# Patient Record
Sex: Female | Born: 1945 | Race: White | Hispanic: No | Marital: Married | Smoking: Never smoker
Health system: Southern US, Community
[De-identification: ages and names within clinical notes are randomized; demographics above are authoritative.]

## PROBLEM LIST (undated history)

## (undated) DIAGNOSIS — E785 Hyperlipidemia, unspecified: Secondary | ICD-10-CM

## (undated) DIAGNOSIS — L565 Disseminated superficial actinic porokeratosis (DSAP): Secondary | ICD-10-CM

## (undated) DIAGNOSIS — C801 Malignant (primary) neoplasm, unspecified: Secondary | ICD-10-CM

## (undated) DIAGNOSIS — Z889 Allergy status to unspecified drugs, medicaments and biological substances status: Secondary | ICD-10-CM

## (undated) DIAGNOSIS — N289 Disorder of kidney and ureter, unspecified: Secondary | ICD-10-CM

## (undated) DIAGNOSIS — C4492 Squamous cell carcinoma of skin, unspecified: Secondary | ICD-10-CM

## (undated) DIAGNOSIS — M109 Gout, unspecified: Secondary | ICD-10-CM

## (undated) DIAGNOSIS — M1990 Unspecified osteoarthritis, unspecified site: Secondary | ICD-10-CM

## (undated) DIAGNOSIS — I10 Essential (primary) hypertension: Secondary | ICD-10-CM

## (undated) DIAGNOSIS — I83893 Varicose veins of bilateral lower extremities with other complications: Secondary | ICD-10-CM

## (undated) HISTORY — DX: Essential (primary) hypertension: I10

## (undated) HISTORY — PX: TONSILLECTOMY: SUR1361

## (undated) HISTORY — DX: Varicose veins of bilateral lower extremities with other complications: I83.893

## (undated) HISTORY — DX: Malignant (primary) neoplasm, unspecified: C80.1

## (undated) HISTORY — DX: Squamous cell carcinoma of skin, unspecified: C44.92

## (undated) HISTORY — DX: Unspecified osteoarthritis, unspecified site: M19.90

## (undated) HISTORY — DX: Disseminated superficial actinic porokeratosis (DSAP): L56.5

---

## 1995-01-04 HISTORY — PX: KNEE SURGERY: SHX244

## 2003-10-04 ENCOUNTER — Encounter: Payer: Self-pay | Admitting: Unknown Physician Specialty

## 2003-11-04 ENCOUNTER — Encounter: Payer: Self-pay | Admitting: Unknown Physician Specialty

## 2004-06-17 ENCOUNTER — Ambulatory Visit: Payer: Self-pay | Admitting: Internal Medicine

## 2005-02-16 ENCOUNTER — Ambulatory Visit: Payer: Self-pay | Admitting: Gerontology

## 2005-07-18 ENCOUNTER — Ambulatory Visit: Payer: Self-pay | Admitting: Internal Medicine

## 2005-11-08 ENCOUNTER — Ambulatory Visit: Payer: Self-pay | Admitting: Internal Medicine

## 2005-11-14 ENCOUNTER — Ambulatory Visit: Payer: Self-pay | Admitting: Gastroenterology

## 2005-12-20 ENCOUNTER — Ambulatory Visit: Payer: Self-pay

## 2006-01-03 HISTORY — PX: BACK SURGERY: SHX140

## 2006-08-03 ENCOUNTER — Ambulatory Visit: Payer: Self-pay | Admitting: Internal Medicine

## 2007-08-06 ENCOUNTER — Ambulatory Visit: Payer: Self-pay | Admitting: Internal Medicine

## 2008-08-06 ENCOUNTER — Ambulatory Visit: Payer: Self-pay | Admitting: Internal Medicine

## 2009-01-03 HISTORY — PX: COLONOSCOPY: SHX174

## 2009-05-12 ENCOUNTER — Ambulatory Visit: Payer: Self-pay | Admitting: Internal Medicine

## 2009-06-30 ENCOUNTER — Ambulatory Visit: Payer: Self-pay | Admitting: Rheumatology

## 2009-08-10 ENCOUNTER — Ambulatory Visit: Payer: Self-pay | Admitting: Internal Medicine

## 2009-08-11 ENCOUNTER — Ambulatory Visit: Payer: Self-pay | Admitting: Gastroenterology

## 2010-01-03 HISTORY — PX: CYST REMOVAL HAND: SHX6279

## 2010-01-03 HISTORY — PX: BREAST SURGERY: SHX581

## 2010-01-03 HISTORY — PX: OTHER SURGICAL HISTORY: SHX169

## 2010-05-06 ENCOUNTER — Ambulatory Visit: Payer: Self-pay | Admitting: Obstetrics and Gynecology

## 2010-05-13 ENCOUNTER — Ambulatory Visit: Payer: Self-pay | Admitting: Obstetrics and Gynecology

## 2010-06-09 ENCOUNTER — Ambulatory Visit: Payer: Self-pay | Admitting: Internal Medicine

## 2010-08-12 ENCOUNTER — Ambulatory Visit: Payer: Self-pay | Admitting: Internal Medicine

## 2010-08-26 ENCOUNTER — Ambulatory Visit: Payer: Self-pay | Admitting: Internal Medicine

## 2010-09-13 ENCOUNTER — Ambulatory Visit: Payer: Self-pay | Admitting: General Surgery

## 2010-09-13 HISTORY — PX: BREAST BIOPSY: SHX20

## 2010-09-15 LAB — PATHOLOGY REPORT

## 2011-01-04 HISTORY — PX: OTHER SURGICAL HISTORY: SHX169

## 2011-03-01 ENCOUNTER — Ambulatory Visit: Payer: Self-pay | Admitting: General Surgery

## 2011-08-15 ENCOUNTER — Ambulatory Visit: Payer: Self-pay | Admitting: General Surgery

## 2011-09-13 ENCOUNTER — Ambulatory Visit: Payer: Self-pay | Admitting: Internal Medicine

## 2012-05-30 ENCOUNTER — Ambulatory Visit: Payer: Self-pay | Admitting: General Surgery

## 2012-06-01 ENCOUNTER — Encounter: Payer: Self-pay | Admitting: *Deleted

## 2012-08-03 DIAGNOSIS — C801 Malignant (primary) neoplasm, unspecified: Secondary | ICD-10-CM

## 2012-08-03 HISTORY — DX: Malignant (primary) neoplasm, unspecified: C80.1

## 2012-09-10 ENCOUNTER — Ambulatory Visit: Payer: Self-pay | Admitting: General Surgery

## 2012-09-13 ENCOUNTER — Encounter: Payer: Self-pay | Admitting: General Surgery

## 2012-09-17 ENCOUNTER — Encounter: Payer: Self-pay | Admitting: General Surgery

## 2012-09-17 ENCOUNTER — Ambulatory Visit (INDEPENDENT_AMBULATORY_CARE_PROVIDER_SITE_OTHER): Payer: No Typology Code available for payment source | Admitting: General Surgery

## 2012-09-17 VITALS — BP 130/68 | HR 74 | Resp 14 | Ht 61.0 in | Wt 156.0 lb

## 2012-09-17 DIAGNOSIS — L97912 Non-pressure chronic ulcer of unspecified part of right lower leg with fat layer exposed: Secondary | ICD-10-CM

## 2012-09-17 DIAGNOSIS — Z1239 Encounter for other screening for malignant neoplasm of breast: Secondary | ICD-10-CM | POA: Insufficient documentation

## 2012-09-17 DIAGNOSIS — L97909 Non-pressure chronic ulcer of unspecified part of unspecified lower leg with unspecified severity: Secondary | ICD-10-CM | POA: Insufficient documentation

## 2012-09-17 DIAGNOSIS — I83893 Varicose veins of bilateral lower extremities with other complications: Secondary | ICD-10-CM | POA: Insufficient documentation

## 2012-09-17 NOTE — Progress Notes (Signed)
Patient ID: Sheryl Kennedy, female   DOB: 11/04/1945, 67 y.o.   MRN: [id number]  Chief Complaint  Patient presents with  . Follow-up    mammogram    HPI Sheryl Kennedy is a 67 y.o. female  Here for follow up screening mammogram done on  09/10/12 at [facility].  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 Hypertension   . 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 complications   . Arthritis   . Cancer 08/2012    skin right anterior lower leg, squamous cell    Past Surgical History  Procedure Laterality Date  . Colonoscopy  2011  . Stab pheblectomy  Left 2013  . Vein closure Bilateral 2012  . Cyst removal hand  2012  . Knee surgery  1997  . Back surgery  2008  . Breast surgery Right 2012    Family History  Problem Relation Age of Onset  . Heart disease Mother     Social History History  Substance Use Topics  . Smoking status: Never Smoker   . Smokeless tobacco: Never Used  . Alcohol Use: Yes    Allergies  Allergen Reactions  . Shellfish Allergy     Joint swelling  . Sulfa Antibiotics Rash    Current Outpatient Prescriptions  Medication Sig Dispense Refill  . allopurinol (ZYLOPRIM) 300 MG tablet Take 300 mg by mouth daily.      Marland Kitchen aspirin 81 MG tablet Take 81 mg by mouth daily.      . furosemide (LASIX) 20 MG tablet Take 20 mg by mouth daily.      Marland Kitchen ibuprofen (ADVIL,MOTRIN) 800 MG tablet Take 800 mg by mouth every 8 (eight) hours as needed for pain.      . potassium chloride (KLOR-CON) 20 MEQ packet Take 20 mEq by mouth 2 (two) times daily.      Marland Kitchen spironolactone-hydrochlorothiazide (ALDACTAZIDE) 50-50 MG per tablet Take 1 tablet by mouth daily.      . traMADol (ULTRAM) 50 MG tablet Take 50 mg by mouth every 6 (six) hours as needed for pain.      . vitamin E 400 UNIT capsule Take 400 Units by mouth  daily.       No current facility-administered medications for this visit.    Review of Systems Review of Systems  Constitutional: Negative.   Respiratory: Negative.   Cardiovascular: Negative.     Blood pressure 130/68, pulse 74, resp. rate 14, height 5\' 1"  (1.549 m), weight 156 lb (70.761 kg).  Physical Exam Physical Exam  Constitutional: She is oriented to person, place, and time. She appears well-developed and well-nourished.  Eyes: Conjunctivae are normal. No scleral icterus.  Neck: Neck supple. No thyromegaly present.  Cardiovascular: Normal rate, regular rhythm and normal heart sounds.   Pulses:      Dorsalis pedis pulses are 2+ on the right side, and 2+ on the left side.       Posterior tibial pulses are 2+ on the right side, and 2+ on the left side.  She has a 1 cm circular open wound on the anterior right leg approximately 7 cm above the ankle. This it the site where she had a SCC removed almost 6-8 weeks ago. No edema in the legs. Some residual varicose veins are seen bilaterally.  Feet are warm with brisk capillary refill.  Pulmonary/Chest: Effort normal and breath sounds normal. Right breast exhibits no inverted nipple, no mass, no nipple discharge, no skin change and no tenderness. Left breast exhibits no inverted nipple, no mass, no nipple discharge, no skin change and no tenderness.  Abdominal: Soft. Normal appearance. There is no tenderness.  Lymphadenopathy:    She has no cervical adenopathy.    She has no axillary adenopathy.  Neurological: She is alert and oriented to person, place, and time. She has normal strength. No sensory deficit.    Data Reviewed Mammogram reviewed  Assessment    Stable exam. Slow healing wd right leg. Need to get final path from her dermatologist. It is possible slow healing is related to her VV.    Plan    Patient to return in one year. Will talk to her dermatologist.       Christene Lye 09/17/2012, 3:14 PM

## 2012-09-17 NOTE — Patient Instructions (Addendum)

## 2012-10-01 ENCOUNTER — Ambulatory Visit: Payer: Self-pay | Admitting: General Surgery

## 2013-03-25 ENCOUNTER — Ambulatory Visit: Payer: Self-pay | Admitting: Cardiology

## 2013-05-02 DIAGNOSIS — I10 Essential (primary) hypertension: Secondary | ICD-10-CM | POA: Insufficient documentation

## 2013-05-16 ENCOUNTER — Ambulatory Visit (INDEPENDENT_AMBULATORY_CARE_PROVIDER_SITE_OTHER): Payer: No Typology Code available for payment source | Admitting: Podiatry

## 2013-05-16 ENCOUNTER — Encounter: Payer: Self-pay | Admitting: Podiatry

## 2013-05-16 ENCOUNTER — Ambulatory Visit (INDEPENDENT_AMBULATORY_CARE_PROVIDER_SITE_OTHER): Payer: No Typology Code available for payment source

## 2013-05-16 ENCOUNTER — Other Ambulatory Visit: Payer: Self-pay | Admitting: *Deleted

## 2013-05-16 VITALS — BP 127/74 | HR 76 | Resp 16 | Ht 61.0 in | Wt 155.0 lb

## 2013-05-16 DIAGNOSIS — M722 Plantar fascial fibromatosis: Secondary | ICD-10-CM

## 2013-05-16 MED ORDER — INDOMETHACIN 50 MG PO CAPS: 50.0000 mg | ORAL_CAPSULE | Freq: Two times a day (BID) | ORAL | Status: DC

## 2013-05-16 NOTE — Progress Notes (Signed)
My left foot in the instep and the ankle has been bothering me again. She continues to take her allopurinol a regular basis and states that she did have a stumble been may have initiated some pain to his left foot. She denies fever chills nausea vomiting muscle aches and pains.  Objective: Vital signs are stable she is alert and oriented x3. Pulses are strongly palpable bilateral. She has pain on palpation to the posterior tibial tendon and across the dorsal aspect of the left foot. Radiographic evaluation does demonstrates severe osteoarthritis of the left foot. This is possibly associated with gout and a history of gouty arthritis. She does have erythema and mild edema to the dorsal and dorsal medial aspect of the left foot. Is possibly indicative of an early gout attack.  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  Plan: Discussed etiology pathology conservative versus surgical therapies. Injected the area today with Kenalog and local anesthetic. Wrote her prescription for indomethacin 50 mg 1 twice a day. We also sent her out for blood work consisting of a CBC and liver profile. I will followup with her in a few weeks. Should her blood work come back abnormal we will notify her immediately.

## 2013-05-17 ENCOUNTER — Telehealth: Payer: Self-pay

## 2013-05-17 LAB — ANA: Anti Nuclear Antibody(ANA): POSITIVE — AB

## 2013-05-17 LAB — SEDIMENTATION RATE: Sed Rate: 3 mm/hr (ref 0–40)

## 2013-05-17 LAB — URIC ACID: Uric Acid: 7.4 mg/dL — ABNORMAL HIGH (ref 2.5–7.1)

## 2013-05-17 LAB — RHEUMATOID FACTOR: Rhuematoid fact SerPl-aCnc: 9.5 IU/mL (ref 0.0–13.9)

## 2013-05-17 LAB — C-REACTIVE PROTEIN: CRP: 2.3 mg/L (ref 0.0–4.9)

## 2013-05-17 NOTE — Telephone Encounter (Signed)
Patient called stating the prescription for Indocin 50 mg is making her nauseated and felt like she was going to pass out. She would like to know if there is something else she can try.

## 2013-05-20 NOTE — Telephone Encounter (Signed)
Have her stop the indocin and start ibuprofen 600 mg three times daily.

## 2013-05-21 ENCOUNTER — Telehealth: Payer: Self-pay | Admitting: *Deleted

## 2013-05-21 NOTE — Telephone Encounter (Signed)
Called and spoke with pt regarding lab work. Told her uric acid high. Dr Milinda Pointer would like to refer pt out to see rheumatologist. Dr Precious Reel is not accepting new pts. Sending out pts to [facility]. i left message with valery and delydia asking them dr they use in [place]. Told pt once i get a drs name i will schedule an appt for her. Pt understood.

## 2013-05-28 ENCOUNTER — Telehealth: Payer: Self-pay | Admitting: *Deleted

## 2013-05-28 NOTE — Telephone Encounter (Signed)
[facility]- [address], [place] [address] [phone] FOR APPT

## 2013-05-28 NOTE — Telephone Encounter (Signed)
Called and left message for pt  to call back. appt with dr. Amil Amen 6.25.15 at 10:00. Arrive @ 9.45.

## 2013-06-13 ENCOUNTER — Ambulatory Visit (INDEPENDENT_AMBULATORY_CARE_PROVIDER_SITE_OTHER): Payer: No Typology Code available for payment source | Admitting: Podiatry

## 2013-06-13 ENCOUNTER — Encounter: Payer: Self-pay | Admitting: Podiatry

## 2013-06-13 DIAGNOSIS — M659 Synovitis and tenosynovitis, unspecified: Secondary | ICD-10-CM

## 2013-06-13 DIAGNOSIS — M7750 Other enthesopathy of unspecified foot: Secondary | ICD-10-CM

## 2013-06-13 MED ORDER — COLCHICINE 0.6 MG PO TABS: 0.6000 mg | ORAL_TABLET | Freq: Every day | ORAL | Status: DC

## 2013-06-14 NOTE — Progress Notes (Signed)
She presents today for followup of her plantar fasciitis and gouty arthritis and tendinitis. She states it is still hurts right in here she points to the posterior tibial tendon insertion site. As well as the anterior tibial tendon insertion site.  Objective: Vital signs are stable she is alert and oriented x3 no pain in the left heel. She has pain on palpation of the insertion site of the anterior tibial tendon.  Assessment: Followup of her gouty capsulitis with insertional tendinitis.  Plan: Injected dexamethasone point of tenderness insertion left foot. Also prescribed colchicine for future gout attacks.

## 2013-06-24 DIAGNOSIS — N183 Chronic kidney disease, stage 3 unspecified: Secondary | ICD-10-CM | POA: Insufficient documentation

## 2013-06-24 DIAGNOSIS — N1832 Chronic kidney disease, stage 3b: Secondary | ICD-10-CM | POA: Insufficient documentation

## 2013-07-16 DIAGNOSIS — E785 Hyperlipidemia, unspecified: Secondary | ICD-10-CM | POA: Insufficient documentation

## 2013-07-16 DIAGNOSIS — M1990 Unspecified osteoarthritis, unspecified site: Secondary | ICD-10-CM | POA: Insufficient documentation

## 2013-07-16 DIAGNOSIS — M109 Gout, unspecified: Secondary | ICD-10-CM | POA: Insufficient documentation

## 2013-08-05 ENCOUNTER — Telehealth: Payer: Self-pay | Admitting: *Deleted

## 2013-08-06 ENCOUNTER — Other Ambulatory Visit: Payer: Self-pay | Admitting: *Deleted

## 2013-08-06 MED ORDER — METHYLPREDNISOLONE (PAK) 4 MG PO TABS: ORAL_TABLET | ORAL | Status: DC

## 2013-08-06 NOTE — Telephone Encounter (Signed)
Ok to prescribe medrol dose pack. Take as directed with no refills.

## 2013-08-06 NOTE — Telephone Encounter (Signed)
Per dr Milinda Pointer send over medrol dose pack. Take as directed. Spoke with pt letting her know was sent to [facility]. Pt understood.

## 2013-08-06 NOTE — Telephone Encounter (Signed)
Opened in error

## 2013-08-15 ENCOUNTER — Ambulatory Visit (INDEPENDENT_AMBULATORY_CARE_PROVIDER_SITE_OTHER): Payer: No Typology Code available for payment source | Admitting: Podiatry

## 2013-08-15 VITALS — BP 137/88 | HR 88 | Resp 16

## 2013-08-15 DIAGNOSIS — M722 Plantar fascial fibromatosis: Secondary | ICD-10-CM

## 2013-08-16 NOTE — Progress Notes (Signed)
She presents today with a chief complaint of pain and swelling to her left ankle and foot. She states that since we called in the prednisone recently is doing much better however she still has pain on the lateral aspect of the foot and she points to the fifth metatarsal base.  Objective: Vital signs are stable she is alert and oriented x3. Multiple varicosities possibly resulting in order of the edema to the left foot and ankle. Pulses are palpable left. She has pain on palpation of the medial calcaneal tubercle left. She also has pain on palpation to the fifth metatarsal base left.  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  Plan: I injected the left heel today in an attempt to alleviate plantar fasciitis and lateral compensatory syndrome.

## 2013-09-05 ENCOUNTER — Ambulatory Visit: Payer: No Typology Code available for payment source | Admitting: Podiatry

## 2013-09-05 VITALS — BP 132/67 | HR 84 | Resp 16

## 2013-09-05 DIAGNOSIS — M659 Synovitis and tenosynovitis, unspecified: Secondary | ICD-10-CM

## 2013-09-05 DIAGNOSIS — M7750 Other enthesopathy of unspecified foot: Secondary | ICD-10-CM

## 2013-09-05 DIAGNOSIS — M722 Plantar fascial fibromatosis: Secondary | ICD-10-CM

## 2013-09-05 DIAGNOSIS — M79609 Pain in unspecified limb: Secondary | ICD-10-CM

## 2013-09-05 MED ORDER — NABUMETONE 750 MG PO TABS: 750.0000 mg | ORAL_TABLET | Freq: Two times a day (BID) | ORAL | Status: DC

## 2013-09-05 NOTE — Progress Notes (Signed)
She presents today for a followup of her plantar fasciitis Achilles tendinitis and pain to the lateral aspect of her left foot. She states it is still painful in discussing to be getting any better. She's also complaining to her right knee is so painful and she just about ready to have surgery on it in 3 weeks.  Objective: Vital signs are stable she is alert and oriented x3 she has severe pain on palpation medial continued tubercle of the left heel. Painful tendo Achilles as it inserts on the posterior aspect of the left heel as well as a fifth metatarsal base bursitis or insertional peroneal tendinitis.  Assessment: Plantar fasciitis peroneal tendinitis Achilles tendinitis left.  Plan: Discussed etiology pathology conservative versus surgical therapies at this point I injected the plantar fascial once again today the bursitis of the fifth met base. I placed her in a Cam Walker. I will followup with her in 2 weeks at which time if she is no better we will ask for an MRI.

## 2013-09-18 ENCOUNTER — Ambulatory Visit: Payer: Self-pay | Admitting: General Practice

## 2013-09-18 LAB — BASIC METABOLIC PANEL
Anion Gap: 9 (ref 7–16)
BUN: 26 mg/dL — ABNORMAL HIGH (ref 7–18)
Calcium, Total: 9.7 mg/dL (ref 8.5–10.1)
Chloride: 106 mmol/L (ref 98–107)
Co2: 29 mmol/L (ref 21–32)
Creatinine: 1.48 mg/dL — ABNORMAL HIGH (ref 0.60–1.30)
EGFR (African American): 42 — ABNORMAL LOW
EGFR (Non-African Amer.): 36 — ABNORMAL LOW
Glucose: 81 mg/dL (ref 65–99)
Osmolality: 291 (ref 275–301)
Potassium: 3.8 mmol/L (ref 3.5–5.1)
Sodium: 144 mmol/L (ref 136–145)

## 2013-09-18 LAB — APTT: Activated PTT: <23 secs — ABNORMAL LOW (ref 23.6–35.9)

## 2013-09-18 LAB — CBC
HCT: 43.1 % (ref 35.0–47.0)
HGB: 13.6 g/dL (ref 12.0–16.0)
MCH: 30.2 pg (ref 26.0–34.0)
MCHC: 31.5 g/dL — ABNORMAL LOW (ref 32.0–36.0)
MCV: 96 fL (ref 80–100)
Platelet: 277 10*3/uL (ref 150–440)
RBC: 4.49 10*6/uL (ref 3.80–5.20)
RDW: 16 % — ABNORMAL HIGH (ref 11.5–14.5)
WBC: 12.6 10*3/uL — ABNORMAL HIGH (ref 3.6–11.0)

## 2013-09-18 LAB — URINALYSIS, COMPLETE
Bilirubin,UR: NEGATIVE
Blood: NEGATIVE
Glucose,UR: NEGATIVE mg/dL (ref 0–75)
Hyaline Cast: 1
Ketone: NEGATIVE
Nitrite: NEGATIVE
Ph: 5 (ref 4.5–8.0)
Protein: NEGATIVE
RBC,UR: <1 /HPF (ref 0–5)
Specific Gravity: 1.006 (ref 1.003–1.030)
Squamous Epithelial: NONE SEEN
WBC UR: 12 /HPF (ref 0–5)

## 2013-09-18 LAB — PROTIME-INR
INR: 1
Prothrombin Time: 13.1 secs (ref 11.5–14.7)

## 2013-09-18 LAB — SEDIMENTATION RATE: Erythrocyte Sed Rate: 6 mm/hr (ref 0–30)

## 2013-09-18 LAB — MRSA PCR SCREENING

## 2013-09-19 ENCOUNTER — Encounter: Payer: No Typology Code available for payment source | Admitting: Podiatry

## 2013-09-20 ENCOUNTER — Ambulatory Visit: Payer: Self-pay | Admitting: Internal Medicine

## 2013-09-20 LAB — URINE CULTURE

## 2013-09-24 ENCOUNTER — Ambulatory Visit: Payer: No Typology Code available for payment source | Admitting: General Surgery

## 2013-09-30 ENCOUNTER — Inpatient Hospital Stay: Payer: Self-pay | Admitting: General Practice

## 2013-09-30 HISTORY — PX: JOINT REPLACEMENT: SHX530

## 2013-10-01 LAB — BASIC METABOLIC PANEL
Anion Gap: 7 (ref 7–16)
BUN: 17 mg/dL (ref 7–18)
Calcium, Total: 8.3 mg/dL — ABNORMAL LOW (ref 8.5–10.1)
Chloride: 104 mmol/L (ref 98–107)
Co2: 26 mmol/L (ref 21–32)
Creatinine: 1.15 mg/dL (ref 0.60–1.30)
EGFR (African American): >60
EGFR (Non-African Amer.): 50 — ABNORMAL LOW
Glucose: 89 mg/dL (ref 65–99)
Osmolality: 275 (ref 275–301)
Potassium: 3.2 mmol/L — ABNORMAL LOW (ref 3.5–5.1)
Sodium: 137 mmol/L (ref 136–145)

## 2013-10-01 LAB — HEMOGLOBIN: HGB: 11 g/dL — ABNORMAL LOW (ref 12.0–16.0)

## 2013-10-01 LAB — PLATELET COUNT: Platelet: 200 10*3/uL (ref 150–440)

## 2013-10-02 LAB — BASIC METABOLIC PANEL
Anion Gap: 9 (ref 7–16)
BUN: 12 mg/dL (ref 7–18)
Calcium, Total: 8.9 mg/dL (ref 8.5–10.1)
Chloride: 96 mmol/L — ABNORMAL LOW (ref 98–107)
Co2: 30 mmol/L (ref 21–32)
Creatinine: 1.01 mg/dL (ref 0.60–1.30)
EGFR (African American): >60
EGFR (Non-African Amer.): 58 — ABNORMAL LOW
Glucose: 107 mg/dL — ABNORMAL HIGH (ref 65–99)
Osmolality: 270 (ref 275–301)
Potassium: 2.7 mmol/L — ABNORMAL LOW (ref 3.5–5.1)
Sodium: 135 mmol/L — ABNORMAL LOW (ref 136–145)

## 2013-10-02 LAB — PLATELET COUNT: Platelet: 212 10*3/uL (ref 150–440)

## 2013-10-02 LAB — HEMOGLOBIN: HGB: 11.6 g/dL — ABNORMAL LOW (ref 12.0–16.0)

## 2013-10-03 LAB — BASIC METABOLIC PANEL
Anion Gap: 5 — ABNORMAL LOW (ref 7–16)
BUN: 12 mg/dL (ref 7–18)
Calcium, Total: 9.2 mg/dL (ref 8.5–10.1)
Chloride: 95 mmol/L — ABNORMAL LOW (ref 98–107)
Co2: 34 mmol/L — ABNORMAL HIGH (ref 21–32)
Creatinine: 1.03 mg/dL (ref 0.60–1.30)
EGFR (African American): >60
EGFR (Non-African Amer.): 57 — ABNORMAL LOW
Glucose: 119 mg/dL — ABNORMAL HIGH (ref 65–99)
Osmolality: 269 (ref 275–301)
Potassium: 3.7 mmol/L (ref 3.5–5.1)
Sodium: 134 mmol/L — ABNORMAL LOW (ref 136–145)

## 2013-10-09 ENCOUNTER — Encounter: Payer: Self-pay | Admitting: *Deleted

## 2013-11-04 ENCOUNTER — Encounter: Payer: Self-pay | Admitting: Podiatry

## 2013-11-21 ENCOUNTER — Ambulatory Visit (INDEPENDENT_AMBULATORY_CARE_PROVIDER_SITE_OTHER): Payer: No Typology Code available for payment source | Admitting: Podiatry

## 2013-11-21 VITALS — BP 130/63 | HR 102 | Resp 16

## 2013-11-21 DIAGNOSIS — M25572 Pain in left ankle and joints of left foot: Secondary | ICD-10-CM

## 2013-11-21 DIAGNOSIS — M7752 Other enthesopathy of left foot: Secondary | ICD-10-CM

## 2013-11-21 DIAGNOSIS — M79672 Pain in left foot: Secondary | ICD-10-CM

## 2013-11-21 DIAGNOSIS — M779 Enthesopathy, unspecified: Secondary | ICD-10-CM

## 2013-11-21 NOTE — Patient Instructions (Signed)
Ice the area over the next couple of days to help prevent a steroid flare.

## 2013-11-22 NOTE — Progress Notes (Signed)
Patient ID: REGINE GERMER, female   DOB: Jun 07, 1945, 68 y.o.   MRN: [id number]  Subjective: 68 year old female returns the office today for continued pain in her left foot. Previous states she has been treated for plantar fasciitis, Achilles tendinitis and bursitis and the fifth metatarsal base. She states that she no longer exchanges pain in those areas. After last appointment she was dispensed a CAM walker although she states that she did not wear it. Today she states that she is new areas of pain for which she points the sinus tarsi and the lateral aspect of the left foot. She denies any recent injury or trauma to the area. She said this has been ongoing for some time. She denies any increased swelling or any skin changes over the area. No other complaints at this time. No acute changes since last appointment.  Objective: AAO 3, NAD DP/PT pulses palpable bilaterally, CRT less than 3 seconds Decreased protective sensation with Simms Weinstein monofilament There is tenderness palpation overlying the lateral aspect left foot over the sinus tarsi. There is mild discomfort with range of motion of the subtalar joint. There is no tenderness or pain with range of motion over the ankle joint. No tenderness over the plantar medial tubercle of the calcaneus, fifth metatarsal base or the Achilles tendon. There is no significant increase in edema or increase in warmth to the foot. MMT 4/5, ROM WNL. Varicose veins present. No open lesions identified.  Assessment: 68 year old female with significant chronic breakdown of the foot with pain along the sinus tarsi.  Plan: -Treatment options were discussed including alternatives, risks, complications. -At this time there is no increase in warmth or edema to the foot compared to the contralateral extremity and there does not appear to be any significant change compared to prior evaluation. -At this time discussed a steroid injection with the patient into the sinus  tarsi to see if this helps alleviate her symptoms as well as for diagnostic purposes. Risks and complications of the injection were discussed the patient for which she understands and verbally consents to the injection. Under standard Betadine preparation (which the patient states she is not allergic to, although she has a shellfish allergy), a total of 2 mL of dexamethasone 4 mg and 2% lidocaine plain and 0.5% Marcaine plain mixture was infiltrated into the area of maximal tenderness over the sinus tarsi. Band-Aid applied. Patient tolerated the injection well any complications. Discussed the patient ice the area over the next couple days to help prevent a steroid flare. Also provided instructions for post-injection care. -Discussed the patient should likely benefit from some custom molded orthotics. A prescription was given for the patient to go to [facility] to have these made. -Follow-up in 2 weeks. In the meantime, call the office with any questions, concerns, changes symptoms. If she has continued symptoms we'll get an MRI.

## 2013-12-05 ENCOUNTER — Ambulatory Visit (INDEPENDENT_AMBULATORY_CARE_PROVIDER_SITE_OTHER): Payer: No Typology Code available for payment source | Admitting: Podiatry

## 2013-12-05 VITALS — BP 147/71 | HR 96 | Resp 16

## 2013-12-05 DIAGNOSIS — M7752 Other enthesopathy of left foot: Secondary | ICD-10-CM

## 2013-12-05 DIAGNOSIS — M79672 Pain in left foot: Secondary | ICD-10-CM

## 2013-12-05 DIAGNOSIS — M25572 Pain in left ankle and joints of left foot: Secondary | ICD-10-CM

## 2013-12-05 MED ORDER — METHYLPREDNISOLONE (PAK) 4 MG PO TABS: ORAL_TABLET | ORAL | Status: DC

## 2013-12-05 NOTE — Patient Instructions (Signed)
Methylprednisolone tablets What is this medicine? METHYLPREDNISOLONE (meth ill pred NISS oh lone) is a corticosteroid. It is commonly used to treat inflammation of the skin, joints, lungs, and other organs. Common conditions treated include asthma, allergies, and arthritis. It is also used for other conditions, such as blood disorders and diseases of the adrenal glands. This medicine may be used for other purposes; ask your health care provider or pharmacist if you have questions. COMMON BRAND NAME(S): Medrol, Medrol Dosepak What should I tell my health care provider before I take this medicine? They need to know if you have any of these conditions: -Cushing's syndrome -diabetes -glaucoma -heart problems or disease -high blood pressure -infection such as herpes, measles, tuberculosis, or chickenpox -kidney disease -liver disease -mental problems -myasthenia gravis -osteoporosis -seizures -stomach ulcer or intestine disease including colitis and diverticulitis -thyroid problem -an unusual or allergic reaction to lactose, methylprednisolone, other medicines, foods, dyes, or preservatives -pregnant or trying to get pregnant -breast-feeding How should I use this medicine? Take this medicine by mouth with a drink of water. Follow the directions on the prescription label. Take it with food or milk to avoid stomach upset. If you are taking this medicine once a day, take it in the morning. Do not take more medicine than you are told to take. Do not suddenly stop taking your medicine because you may develop a severe reaction. Your doctor will tell you how much medicine to take. If your doctor wants you to stop the medicine, the dose may be slowly lowered over time to avoid any side effects. Talk to your pediatrician regarding the use of this medicine in children. Special care may be needed. Overdosage: If you think you have taken too much of this medicine contact a poison control center or  emergency room at once. NOTE: This medicine is only for you. Do not share this medicine with others. What if I miss a dose? If you miss a dose, take it as soon as you can. If it is almost time for your next dose, talk to your doctor or health care professional. You may need to miss a dose or take an extra dose. Do not take double or extra doses without advice. What may interact with this medicine? Do not take this medicine with any of the following medications: -mifepristone This medicine may also interact with the following medications: -tacrolimus -vaccines -warfarin This list may not describe all possible interactions. Give your health care provider a list of all the medicines, herbs, non-prescription drugs, or dietary supplements you use. Also tell them if you smoke, drink alcohol, or use illegal drugs. Some items may interact with your medicine. What should I watch for while using this medicine? Visit your doctor or health care professional for regular checks on your progress. If you are taking this medicine for a long time, carry an identification card with your name and address, the type and dose of your medicine, and your doctor's name and address. The medicine may increase your risk of getting an infection. Stay away from people who are sick. Tell your doctor or health care professional if you are around anyone with measles or chickenpox. If you are going to have surgery, tell your doctor or health care professional that you have taken this medicine within the last twelve months. Ask your doctor or health care professional about your diet. You may need to lower the amount of salt you eat. The medicine can increase your blood sugar. If you are a  diabetic check with your doctor if you need help adjusting the dose of your diabetic medicine. What side effects may I notice from receiving this medicine? Side effects that you should report to your doctor or health care professional as soon as  possible: -allergic reactions like skin rash, itching or hives, swelling of the face, lips, or tongue -eye pain, decreased or blurred vision, or bulging eyes -fever, sore throat, sneezing, cough, or other signs of infection, wounds that will not heal -increased thirst -mental depression, mood swings, mistaken feelings of self importance or of being mistreated -pain in hips, back, ribs, arms, shoulders, or legs -swelling of the ankles, feet, hands -trouble passing urine or change in the amount of urine Side effects that usually do not require medical attention (report to your doctor or health care professional if they continue or are bothersome): -confusion, excitement, restlessness -headache -nausea, vomiting -skin problems, acne, thin and shiny skin -weight gain This list may not describe all possible side effects. Call your doctor for medical advice about side effects. You may report side effects to FDA at 1-800-FDA-1088. Where should I keep my medicine? Keep out of the reach of children. Store at room temperature between 20 and 25 degrees C (68 and 77 degrees F). Throw away any unused medicine after the expiration date. NOTE: This sheet is a summary. It may not cover all possible information. If you have questions about this medicine, talk to your doctor, pharmacist, or health care provider.  2015, [employer]vier/[employer] Standard. (2011-09-20 11:38:34)

## 2013-12-09 NOTE — Progress Notes (Signed)
Patient ID: Sheryl Kennedy, female   DOB: 08/21/1945, 68 y.o.   MRN: [id number]  Subjective: 68 year old female returns the office they for follow-up evaluation of pain to the left foot. She states that since last appointment after the injection she's had decreased in symptoms although she does continue to have some mild discomfort. She states that she feels as if when she gets an injection in one place the pain move somewhere else. No acute changes since last appointment. Denies any systemic complaints as fevers, chills, nausea, vomiting. No other complaints at this time.  Objective: AAO 3, NAD DP/PT pulses palpable bilaterally, CRT less than 3 seconds Decreased protective sensation with Simms Weinstein monofilament  There is mild tenderness over the lateral aspect of the foot over the sinus tarsi. This area has decreased in symptoms compared to prior appointment. There is no overlying edema, erythema, increase in warmth. Mild discomfort with range of motion of the subtalar joint. Subtalar joint range of motion is limited. No pain with ankle joint range of motion. There is no specific areas of pinpoint bony tenderness or pain with vibratory sensation. There is no overlying edema, erythema, increase in warmth to the foot/ankle. No pain with calf compression, swelling, warmth, erythema.  Assessment: 68 year old female with chronic breakdown of the foot and lateral foot pain along the course of the sinus tarsi.  Plan: -Treatment options were discussed including alternatives, risks, complications. Patient wishes to hold off on any surgical intervention this time.  -Follow-up in another steroid injection last time given the timeframe. However the patient is requesting a Medrol Dosepak as she has had this before and helps her symptoms. I discussed with her risks and complications of the medication for which she understands and wishes to proceed with medication. A 4 mg six-day taper Medrol Dosepak was  prescribed. -Patient follow-up with [facility] for C[facility].  -Follow-up in one month or after orthotics are dispensed. In the meantime, call the office in the questions, concerns, change in symptoms.

## 2014-01-09 ENCOUNTER — Ambulatory Visit: Payer: No Typology Code available for payment source | Admitting: Podiatry

## 2014-04-18 ENCOUNTER — Ambulatory Visit
Admit: 2014-04-18 | Disposition: A | Discharge status: Home / Self Care | Payer: Self-pay | Attending: Internal Medicine | Admitting: Internal Medicine

## 2014-04-23 ENCOUNTER — Ambulatory Visit: Payer: No Typology Code available for payment source | Admitting: General Surgery

## 2014-04-26 NOTE — Op Note (Signed)
PATIENT NAME:  Sheryl Kennedy, Sheryl Kennedy MR#:  [id number] DATE OF BIRTH:  [birth year]  DATE OF PROCEDURE:  09/30/2013  PREOPERATIVE DIAGNOSIS: Degenerative arthrosis of the right knee.   POSTOPERATIVE DIAGNOSIS: Degenerative arthrosis of the right knee.   PROCEDURE PERFORMED: Right total knee arthroplasty using computer-assisted navigation.   SURGEON: Dr. Skip Estimable.  ASSISTANT: Vance Peper, PA (required to maintain retraction throughout the procedure).    ANESTHESIA: Spinal.   ESTIMATED BLOOD LOSS: 200 mL.   FLUIDS REPLACED: 2000 mL of crystalloid.   TOURNIQUET TIME: 79 minutes.   DRAINS: Two medium drains to reinfusion system.   SOFT TISSUE RELEASES: Anterior cruciate ligament, posterior cruciate ligament, deep medial collateral ligament and patellofemoral ligament.   IMPLANTS UTILIZED: DePuy PFC Sigma size 2 posterior stabilized femoral component (cemented), size 2.5 MBT tibial component (cemented), a 32-mm 3-peg oval dome patella (cemented), and a 17.5-mm stabilized rotating platform polyethylene insert.   INDICATIONS FOR SURGERY: The patient is a 69 year old female who has been seen for complaints of progressive right knee pain with valgus deformity. X-rays demonstrated severe degenerative changes in tricompartmental fashion with severe valgus deformity. After discussion of the risks and benefits of surgical intervention, the patient expressed understanding of the risks and benefits, and agreed with plans for surgical intervention.   PROCEDURE IN DETAIL: The patient was brought into the operating room and, after adequate spinal anesthesia was achieved, a tourniquet was placed on the patient's upper right thigh. The patient's right knee and leg were cleaned and prepped with alcohol and DuraPrep, draped in the usual sterile fashion. A "timeout" was performed as per usual protocol. The right lower extremity was exsanguinated using an Esmarch, and the tourniquet was inflated to 300 mmHg. An  anterior longitudinal incision was made followed by a standard mid vastus approach. A large effusion was evacuated. The deep fibers of the medial collateral ligament were elevated in a subperiosteal fashion off the medial flare of the tibia so as to maintain a continuous soft tissue sleeve. The patella was subluxed laterally and the patellofemoral ligament was incised. Inspection of the knee demonstrated severe degenerative changes with full-thickness loss of articular cartilage to the lateral compartment with significant degenerative changes also noted to the medial compartment. Anterior and posterior cruciate ligaments were excised. Two 4.0-mm Schanz pins were inserted into the femur and into the tibia for attachment of the ray of trackers used for computer-assisted navigation. Hip center was identified using a circumduction technique. Distal landmarks were mapped using the computer. The distal femur and proximal tibia were mapped using the computer. Distal femoral cutting guide was positioned using computer-assisted navigation so as to achieve a 5-degree distal valgus cut. The cut was performed and verified using the computer. Distal femur was sized and it was felt that a size 2 femoral component was appropriate. A size 2 cutting guide was positioned and the anterior cut was performed and verified using the computer. This was followed by completion of the posterior and chamfer cuts. Femoral cutting guide for a central box was then positioned and the central box cut was performed. Attention was then directed to the proximal tibia. Medial and lateral menisci were excised. The extramedullary tibial cutting guide was positioned using computer-assisted navigation so as to achieve 0-degree varus and valgus alignment and 0-degree posterior slope. Cut was performed and verified using the computer. The proximal tibia was sized and it was felt that a size 2.5 tibial tray was appropriate. Tibial and femoral trials were  inserted followed by insertion  of first a 10 and subsequently a 15 and eventually a 17.5-mm polyethylene trial. This allowed for excellent mediolateral soft tissue balancing both in full extension and in flexion. Finally, the patella was cut and prepared so as to accommodate a 32-mm 3-peg oval dome patella. Patellar trial was placed and the knee was placed through a range of motion with excellent patellar tracking appreciated. The femoral trial was removed. Central post hole for the tibial component was reamed, followed by insertion of a keel punch. The tibial trials were then removed. The cut surfaces of bone were irrigated with copious amounts of normal saline with antibiotic solution using pulsatile lavage then suctioned dry. Polymethyl methacrylate cement was prepared in the usual fashion with a vacuum mixer. Cement was applied to the cut surface of the proximal tibia as well as along the undersurface of a size 2.5 MBT tibial component. The tibial component was positioned and impacted into place. Excess cement was removed using freer elevators. Cement was then applied to the cut surface of the femur as well as along the posterior flanges of size 2 posterior stabilized femoral component. Femoral component was positioned and impacted into place. Excess cement was removed using freer elevators. A 17.5-mm polyethylene trial was inserted and the knee was brought in full extension with steady axial compression applied. Finally, cement was applied to the backside of a 32-mm 3-peg oval dome patella and the patellar component was positioned and patellar clamp applied. Excess cement was removed using freer elevators. After adequate curing of cement, the tourniquet was deflated after a total tourniquet time of 79 minutes. Hemostasis was achieved using electrocautery. The knee was irrigated with copious amounts of normal saline with antibiotic solution using pulsatile lavage and then suctioned dry. The knee was inspected  for any residual cement debris; 20 mL of 1.3% Exparel and 40 mL of normal saline was injected along the posterior capsule, medial and lateral gutters, and along the arthrotomy site. A 17.5-mm stabilized rotating platform polyethylene insert was inserted and the knee was placed through a range of motion with excellent patellar tracking appreciated and excellent mediolateral soft tissue balance appreciated, both in full extension and in flexion. Two medium drains were placed in the wound bed and brought through a separate stab incision to be attached to a reinfusion system. The medial parapatellar portion of the incision was reapproximated using interrupted sutures of #1 Vicryl. Then, 30 mL of 0.25% Marcaine with epinephrine were injected into the subcutaneous tissue along the incision site. The subcutaneous tissue was then approximated in layers using first #0 Vicryl followed by 2-0 Vicryl. Skin was closed with skin staples. A sterile dressing was applied. The patient tolerated the procedure well. She was transported to the recovery room in stable condition.   ____________________________ Laurice Record. Holley Bouche., MD jph:lt D: 09/30/2013 14:29:05 ET T: 09/30/2013 15:03:54 ET JOB#: [id number]  cc: Jeneen Rinks P. Holley Bouche., MD, <Dictator> Laurice Record Holley Bouche MD ELECTRONICALLY SIGNED 09/30/2013 17:26

## 2014-04-26 NOTE — Discharge Summary (Signed)
PATIENT NAME:  Sheryl Kennedy, Sheryl Kennedy MR#:  [id number] DATE OF BIRTH:  Sep 03, 1945  DATE OF ADMISSION:  09/30/2013 DATE OF DISCHARGE:  10/03/2013  ADMITTING DIAGNOSIS: Degenerative arthrosis of the right knee.   DISCHARGE DIAGNOSES: Degenerative arthrosis of the right knee.  OPERATION: On 09/30/2013 the patient had a right total knee arthroplasty using computer-aided navigation.   SURGEON: Skip Estimable, MD.   ASSISTANT: Vance Peper, PA.   ANESTHESIA: Spinal.   ESTIMATED BLOOD LOSS: 200 mL.   IMPLANTS USED: DePuy PFC Sigma size 2 posterior stabilized femoral component that was cemented, size 2.5 MBT tibial component (cemented), 32-mm 3-peg oval dome patella that was cemented, and a 17.5-mm stabilized rotating platform polyethylene insert. The patient was stabilized, brought to the recovery room and then brought down to the orthopedic floor.   HISTORY:  The patient is a 69 year old female who presented for an upcoming right total knee arthroplasty. The patient has had several years of progressive worsening pain that has been refractory to conservative treatment including anti-inflammatories, cortisone injections and Synvisc injections.   PHYSICAL EXAMINATION:  GENERAL: Alert female with discomfort with ambulation and an antalgic gait.  LUNGS: Clear to auscultation.  CARDIOVASCULAR: Regular rate and rhythm.  MUSCULOSKELETAL: In regard to the right knee the patient has moderate swelling with lateral tenderness. The patient has full extension to 119 degrees of flexion. The patient has good muscle control and good strength.   HOSPITAL COURSE: After initial admission the patient was brought to the orthopedic floor. On postoperative day 1, the patient had a hemoglobin of 11.0, which was at 11.6 the following day after receiving Autovac transfusion. The patient worked with physical therapy, initially bed to chair, and progressed up to ambulating 125 feet including stairs and did well with therapy. The  patient is ready to go home with home health physical therapy.   DISCHARGE INSTRUCTIONS:  The patient will follow-up with [facility] in 2 weeks for staple removal. The patient will do weight-bear as tolerated on the affected leg. The patient will elevate her leg with 1-2 pillows and use thigh-high TED hose on both legs, removed at nighttime. The patient will elevate her heels off the bed. The patient will use incentive spirometer and be encouraged to do cough and deep breathing.  Diet is regular. The patient will use Polar Care to decrease swelling. She will try to keep her dressing clean and dry. The patient will call the clinic if there is any bright red bleeding, calf pain, bowel or bladder difficulty, or any fever greater than 101.5.   DISCHARGE MEDICATIONS:  To resume home medication and to add tramadol 1-2 tablets every 4 hours as needed for mild to moderate pain. Oxycodone 5 mg 1-2 tablets every 4 hours for severe pain, Lovenox 40 mg subcutaneous once a day for 14 days and to begin aspirin 81 mg once a day and Tylenol 500 mg 1 tablet every 4 hours as needed for fever.    ____________________________ Lenna Sciara. Reche Dixon, [place] jtm:lt D: 10/03/2013 06:43:37 ET T: 10/03/2013 09:12:50 ET JOB#: [id number]  cc: J. Reche Dixon, [place], <Dictator> J Xochilt Conant [facility] PA ELECTRONICALLY SIGNED 10/04/2013 9:15

## 2014-06-04 ENCOUNTER — Encounter: Payer: Self-pay | Admitting: *Deleted

## 2014-06-12 ENCOUNTER — Ambulatory Visit (INDEPENDENT_AMBULATORY_CARE_PROVIDER_SITE_OTHER): Payer: No Typology Code available for payment source | Admitting: Podiatry

## 2014-06-12 ENCOUNTER — Encounter: Payer: Self-pay | Admitting: Podiatry

## 2014-06-12 VITALS — BP 132/70 | HR 83 | Resp 16 | Ht 61.0 in | Wt 150.0 lb

## 2014-06-12 DIAGNOSIS — M7752 Other enthesopathy of left foot: Secondary | ICD-10-CM

## 2014-06-12 DIAGNOSIS — M79672 Pain in left foot: Secondary | ICD-10-CM | POA: Diagnosis not present

## 2014-06-12 DIAGNOSIS — M25572 Pain in left ankle and joints of left foot: Secondary | ICD-10-CM

## 2014-06-12 NOTE — Progress Notes (Signed)
Patient ID: Sheryl Kennedy, female   DOB: 06/01/1945, 69 y.o.   MRN: [id number]  Subjective: 69 year old female presents the office they for follow-up evaluation of left foot pain. She states that since last appointment she has gotten [place] orthotics which seem to help alleviate her symptoms. She does that she continues to get some intermittent discomfort on the outside aspect of her left foot. At this time she is requesting a steroid injection to the same area that help previously. She denies any acute changes since last appointment other than injuring her back at work. No other complaints at this time.  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 There is mild discomfort with subtalar joint range of motion there is restriction of motion. Ankle joint range of motion is intact without any crepitation or pain with range of motion. There is no other areas of tenderness to bilateral lower extremities. There is mild edema to the foot however there is no associated erythema or increase in warmth. No open lesions or pre-ulcerative lesions. No pain with calf compression, swelling, warmth, erythema.  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 Complications of injection were discussed the patient for which she verbally consented and understood. Under sterile conditions a total of 2 mL of a mixture of dexamethasone phosphate and 2% lidocaine plain was infiltrated into the lateral aspect of the sinus tarsi without complications. A Band-Aid was then applied. Postinjection care was  discussed the patient. Patient tolerated injection well any complications. -Continue orthotics. -Follow-up as needed. Encouraged her to call the office with a portions, concerns, change/reoccurrence of symptoms. If symptoms are not resolved over the next couple weeks to call the office for follow-up appointment.

## 2014-08-19 ENCOUNTER — Other Ambulatory Visit: Payer: Self-pay | Admitting: Internal Medicine

## 2014-08-19 DIAGNOSIS — Z1231 Encounter for screening mammogram for malignant neoplasm of breast: Secondary | ICD-10-CM

## 2014-09-21 DIAGNOSIS — Z96651 Presence of right artificial knee joint: Secondary | ICD-10-CM | POA: Insufficient documentation

## 2014-09-22 ENCOUNTER — Ambulatory Visit
Admission: RE | Admit: 2014-09-22 | Discharge: 2014-09-22 | Disposition: A | Discharge status: Home / Self Care | Payer: No Typology Code available for payment source | Source: Ambulatory Visit | Attending: Internal Medicine | Admitting: Internal Medicine

## 2014-09-22 DIAGNOSIS — Z1231 Encounter for screening mammogram for malignant neoplasm of breast: Secondary | ICD-10-CM | POA: Insufficient documentation

## 2014-12-04 ENCOUNTER — Encounter: Payer: Self-pay | Admitting: Podiatry

## 2014-12-04 ENCOUNTER — Ambulatory Visit (INDEPENDENT_AMBULATORY_CARE_PROVIDER_SITE_OTHER): Payer: No Typology Code available for payment source | Admitting: Podiatry

## 2014-12-04 DIAGNOSIS — M779 Enthesopathy, unspecified: Secondary | ICD-10-CM | POA: Diagnosis not present

## 2014-12-04 DIAGNOSIS — M6588 Other synovitis and tenosynovitis, other site: Secondary | ICD-10-CM

## 2014-12-04 DIAGNOSIS — M7750 Other enthesopathy of unspecified foot: Secondary | ICD-10-CM

## 2014-12-04 DIAGNOSIS — M7752 Other enthesopathy of left foot: Secondary | ICD-10-CM

## 2014-12-04 DIAGNOSIS — M25572 Pain in left ankle and joints of left foot: Secondary | ICD-10-CM

## 2014-12-04 NOTE — Progress Notes (Signed)
Patient ID: Sheryl Kennedy, female   DOB: [birth year], 69 y.o.   MRN: [id number]  Subjective: 69 year old female presents the office they for follow-up evaluation of left foot pain. She states that his last appointment she has been doing well. She does that her foot is starting to hurt her and due to the upcoming events she would like to go another steroid injection to help decrease the pain to her left foot. She does that she's been wearing the custom orthotics however she cannot fit them into her flat shoes and she is inquiring about a possible orthotic to fit into her flatter style shoes. She denies any recent injury or trauma. She has started to develop some pain in the outside part of her foot for which she points just proximal to the fifth metatarsal base on the left foot. She denies any redness or warmth. She does have chronic edema to her left leg for which she states that she had a venous duplex performed which was negative for DVT. She does work compression stockings intermittently. No other complaints at this time in no acute changes. She denies any systemic complaints as fevers, chills, nausea, vomiting.  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 There is mild discomfort with subtalar joint range of motion and there is restriction of motion. Ankle joint range of motion is intact without any crepitation or pain with range of motion. There is mild discomfort along the course of the nail tenderness just proximal to the fifth metatarsal base. There is no specific area pinpoint bony tenderness there is no pain vibratory sensation. The peroneal tendons appear to be intact. There are no other areas of tenderness to bilateral lower extremities. There is mild  chronic edema to the left ankle and leg. There is no pain with calf compression, erythema, warmth. No open lesions or pre-ulcerative lesions.   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  Plan: -Treatment options discussed including all alternatives, risks, and complications -At this time the patient is requesting a steroid injection. Complications of injection were discussed the patient for which she verbally consented and understood. Under sterile conditions a total of 2 mL of a mixture of dexamethasone phosphate and 2% lidocaine plain was infiltrated into the lateral aspect of the sinus tarsi without complications. A Band-Aid was then applied. Postinjection care was discussed the patient. Patient tolerated injection well any complications. -Continue orthotics. She would like to proceed with a custom dress orthotics. She was scanned for orthotics today and they were sent to [facility] labs. -Continue compression socks. She states that as he had a venous duplex apparently try get the report of this. May need a venous reflux study. Also follow-up with primary care physician. -Follow-up in 3 weeks to pick up orthotics. Encouraged her to call the office with a portions, concerns, change/reoccurrence of symptoms. If symptoms are not resolved over the next couple weeks to call the office for follow-up appointment.  Celesta Gentile, DPM

## 2014-12-16 ENCOUNTER — Ambulatory Visit: Payer: No Typology Code available for payment source | Admitting: Podiatry

## 2015-01-01 ENCOUNTER — Ambulatory Visit: Payer: No Typology Code available for payment source | Admitting: *Deleted

## 2015-01-01 DIAGNOSIS — M722 Plantar fascial fibromatosis: Secondary | ICD-10-CM

## 2015-01-01 NOTE — Patient Instructions (Signed)

## 2015-01-01 NOTE — Progress Notes (Signed)
Patient ID: Sheryl Kennedy, female   DOB: [birth year], 69 y.o.   MRN: [id number] Patient presents for orthotic pick up.  Verbal and written break in and wear instructions given.  Patient will follow up in 4 weeks if symptoms worsen or fail to improve.

## 2015-04-13 ENCOUNTER — Encounter: Payer: Self-pay | Admitting: *Deleted

## 2015-04-14 ENCOUNTER — Ambulatory Visit
Admission: RE | Admit: 2015-04-14 | Discharge: 2015-04-14 | Disposition: A | Discharge status: Home / Self Care | Payer: Managed Care, Other (non HMO) | Source: Ambulatory Visit | Attending: Gastroenterology | Admitting: Gastroenterology

## 2015-04-14 ENCOUNTER — Encounter
Admission: RE | Disposition: A | Discharge status: Home / Self Care | Payer: Self-pay | Source: Ambulatory Visit | Attending: Gastroenterology

## 2015-04-14 ENCOUNTER — Ambulatory Visit: Payer: Managed Care, Other (non HMO) | Admitting: Anesthesiology

## 2015-04-14 ENCOUNTER — Encounter: Payer: Self-pay | Admitting: *Deleted

## 2015-04-14 DIAGNOSIS — C44722 Squamous cell carcinoma of skin of right lower limb, including hip: Secondary | ICD-10-CM | POA: Insufficient documentation

## 2015-04-14 DIAGNOSIS — Z7982 Long term (current) use of aspirin: Secondary | ICD-10-CM | POA: Insufficient documentation

## 2015-04-14 DIAGNOSIS — Z1211 Encounter for screening for malignant neoplasm of colon: Secondary | ICD-10-CM | POA: Insufficient documentation

## 2015-04-14 DIAGNOSIS — Z79899 Other long term (current) drug therapy: Secondary | ICD-10-CM | POA: Insufficient documentation

## 2015-04-14 DIAGNOSIS — M1991 Primary osteoarthritis, unspecified site: Secondary | ICD-10-CM | POA: Diagnosis not present

## 2015-04-14 DIAGNOSIS — I10 Essential (primary) hypertension: Secondary | ICD-10-CM | POA: Insufficient documentation

## 2015-04-14 DIAGNOSIS — Z86010 Personal history of colonic polyps: Secondary | ICD-10-CM | POA: Insufficient documentation

## 2015-04-14 DIAGNOSIS — Q438 Other specified congenital malformations of intestine: Secondary | ICD-10-CM | POA: Insufficient documentation

## 2015-04-14 HISTORY — PX: COLONOSCOPY WITH PROPOFOL: SHX5780

## 2015-04-14 SURGERY — COLONOSCOPY WITH PROPOFOL
Anesthesia: General

## 2015-04-14 MED ORDER — SODIUM CHLORIDE 0.9 % IV SOLN
INTRAVENOUS | Status: DC
Start: 2015-04-14 — End: 2015-04-14

## 2015-04-14 MED ORDER — LIDOCAINE HCL (CARDIAC) 20 MG/ML IV SOLN
INTRAVENOUS | Status: DC | PRN
  Administered 2015-04-14 (×2): 40 mg via INTRAVENOUS

## 2015-04-14 MED ORDER — FENTANYL CITRATE (PF) 100 MCG/2ML IJ SOLN
INTRAMUSCULAR | Status: DC | PRN
  Administered 2015-04-14: 50 ug via INTRAVENOUS

## 2015-04-14 MED ORDER — MIDAZOLAM HCL 5 MG/5ML IJ SOLN
INTRAMUSCULAR | Status: DC | PRN
  Administered 2015-04-14: 1 mg via INTRAVENOUS

## 2015-04-14 MED ORDER — SODIUM CHLORIDE 0.9 % IV SOLN
2.0000 g | Freq: Once | INTRAVENOUS | Status: AC
  Administered 2015-04-14: 11:00:00 via INTRAVENOUS
  Filled 2015-04-14: qty 2000

## 2015-04-14 MED ORDER — SODIUM CHLORIDE 0.9 % IV SOLN
INTRAVENOUS | Status: DC
  Administered 2015-04-14 (×2): via INTRAVENOUS

## 2015-04-14 MED ORDER — PROPOFOL 10 MG/ML IV BOLUS
INTRAVENOUS | Status: DC | PRN
  Administered 2015-04-14: 40 mg via INTRAVENOUS

## 2015-04-14 MED ORDER — PROPOFOL 500 MG/50ML IV EMUL
INTRAVENOUS | Status: DC | PRN
Start: 2015-04-14 — End: 2015-04-14
  Administered 2015-04-14: 140 ug/kg/min via INTRAVENOUS

## 2015-04-14 NOTE — Anesthesia Preprocedure Evaluation (Signed)
Anesthesia Evaluation  Patient identified by MRN, date of birth, ID band Patient awake    Reviewed: Allergy & Precautions, H&P , NPO status , Patient's Chart, lab work & pertinent test results, reviewed documented beta blocker date and time   History of Anesthesia Complications Negative for: history of anesthetic complications  Airway Mallampati: II  TM Distance: >3 FB Neck ROM: full    Dental no notable dental hx. (+) Teeth Intact Permanent bridge on the bottom right:   Pulmonary neg pulmonary ROS,    Pulmonary exam normal breath sounds clear to auscultation       Cardiovascular Exercise Tolerance: Good hypertension, (-) angina+ Peripheral Vascular Disease (Varicose veins)  (-) CAD, (-) Past MI, (-) Cardiac Stents and (-) CABG Normal cardiovascular exam(-) dysrhythmias (-) Valvular Problems/Murmurs Rhythm:regular Rate:Normal     Neuro/Psych negative neurological ROS  negative psych ROS   GI/Hepatic negative GI ROS, Neg liver ROS,   Endo/Other  negative endocrine ROS  Renal/GU negative Renal ROS  negative genitourinary   Musculoskeletal   Abdominal   Peds  Hematology negative hematology ROS (+)   Anesthesia Other Findings Past Medical History:   Hypertension                                                 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*              Arthritis                                                    Cancer ([place])                                    08/2012         Comment:skin right anterior lower leg, squamous cell   Reproductive/Obstetrics negative OB ROS                             Anesthesia Physical Anesthesia Plan  ASA: II  Anesthesia Plan: General   Post-op Pain Management:    Induction:   Airway Management Planned:   Additional Equipment:   Intra-op Plan:   Post-operative Plan:   Informed Consent: I have reviewed the patients History and  Physical, chart, labs and discussed the procedure including the risks, benefits and alternatives for the proposed anesthesia with the patient or authorized representative who has indicated his/her understanding and acceptance.   Dental Advisory Given  Plan Discussed with: Anesthesiologist, CRNA and Surgeon  Anesthesia Plan Comments:         Anesthesia Quick Evaluation

## 2015-04-14 NOTE — H&P (Signed)
Outpatient short stay form Pre-procedure 04/14/2015 11:38 AM Lollie Sails MD  Primary Physician: Dr. Fulton Reek  Reason for visit:  Colonoscopy  History of present illness:  Patient is a 70 year old female presenting today for colonoscopy. There is a personal history of adenomatous colon polyps. She tolerated her prep well. She takes 81 mg aspirin (held that for several days. She takes no other aspirin products or blood thinning agents.    Current facility-administered medications:  .  0.9 %  sodium chloride infusion, , Intravenous, Continuous, Lollie Sails, MD, Last Rate: 20 mL/hr at 04/14/15 1057 .  0.9 %  sodium chloride infusion, , Intravenous, Continuous, Lollie Sails, MD  Prescriptions prior to admission  Medication Sig Dispense Refill Last Dose  . aspirin 81 MG tablet Take 81 mg by mouth daily.   Past Week at Unknown time  . allopurinol (ZYLOPRIM) 300 MG tablet Take 300 mg by mouth daily.   Taking  . colchicine 0.6 MG tablet Take 1 tablet (0.6 mg total) by mouth daily. 90 tablet 1 Taking  . Cyanocobalamin (RA VITAMIN B-12 TR) 1000 MCG TBCR Take by mouth.     . cyclobenzaprine (FLEXERIL) 5 MG tablet Take 5 mg by mouth 3 (three) times daily.  1   . ibuprofen (ADVIL,MOTRIN) 800 MG tablet Take 800 mg by mouth every 8 (eight) hours as needed for pain.   Taking  . indomethacin (INDOCIN) 50 MG capsule Take 1 capsule (50 mg total) by mouth 2 (two) times daily with a meal. 60 capsule 1 Taking  . metoprolol succinate (TOPROL-XL) 25 MG 24 hr tablet    Taking  . NON FORMULARY 4 (four) times daily. Omega 3 xl   Taking  . potassium chloride (KLOR-CON) 20 MEQ packet Take 20 mEq by mouth 2 (two) times daily.   Taking  . potassium chloride SA (K-DUR,KLOR-CON) 20 MEQ tablet    Taking  . Pseudoephedrine HCl (SUDAFED 24 HOUR PO) Take by mouth.   Taking  . simvastatin (ZOCOR) 40 MG tablet    Taking  . spironolactone-hydrochlorothiazide (ALDACTAZIDE) 50-50 MG per tablet Take 1  tablet by mouth daily.   Taking  . torsemide (DEMADEX) 20 MG tablet    Taking  . traMADol (ULTRAM) 50 MG tablet Take 50 mg by mouth every 6 (six) hours as needed for pain.   Taking     Allergies  Allergen Reactions  . Naproxen Swelling  . Pseudoephedrine Rash    Rhinitis and sneezing  . Shellfish Allergy Rash    Joint swelling  Joint swelling  . Sulfa Antibiotics Rash     Past Medical History  Diagnosis Date  . Hypertension   . 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 complications   . Arthritis   . Cancer ([place]) 08/2012    skin right anterior lower leg, squamous cell    Review of systems:      Physical Exam    Heart and lungs: Regular rate and rhythm without rub or gallop, lungs are bilaterally clear.    HEENT: Normocephalic atraumatic eyes are anicteric    Other:     Pertinant exam for procedure: Soft nontender nondistended bowel sounds positive normoactive.    Planned proceedures: Colonoscopy and indicated procedures. I have discussed the risks benefits and complications of procedures to include not limited to bleeding, infection, perforation and the risk of sedation and the patient wishes to proceed.    Lollie Sails, MD Gastroenterology 04/14/2015  11:38 AM

## 2015-04-14 NOTE — Anesthesia Postprocedure Evaluation (Signed)
Anesthesia Post Note  Patient: Sheryl Kennedy  Procedure(s) Performed: Procedure(s) (LRB): COLONOSCOPY WITH PROPOFOL (N/A)  Patient location during evaluation: Endoscopy Anesthesia Type: General Level of consciousness: awake and alert Pain management: pain level controlled Vital Signs Assessment: post-procedure vital signs reviewed and stable Respiratory status: spontaneous breathing, nonlabored ventilation, respiratory function stable and patient connected to nasal cannula oxygen Cardiovascular status: blood pressure returned to baseline and stable Postop Assessment: no signs of nausea or vomiting Anesthetic complications: no    Last Vitals:  Filed Vitals:   04/14/15 1240 04/14/15 1250  BP: 112/73 122/83  Pulse: 78 82  Temp:    Resp: 15 26    Last Pain: There were no vitals filed for this visit.               Martha Clan

## 2015-04-14 NOTE — Transfer of Care (Signed)
Immediate Anesthesia Transfer of Care Note  Patient: Sheryl Kennedy  Procedure(s) Performed: Procedure(s): COLONOSCOPY WITH PROPOFOL (N/A)  Patient Location: PACU  Anesthesia Type:General  Level of Consciousness: sedated  Airway & Oxygen Therapy: Patient Spontanous Breathing and Patient connected to nasal cannula oxygen  Post-op Assessment: Report given to RN and Post -op Vital signs reviewed and stable  Post vital signs: Reviewed and stable  Last Vitals:  Filed Vitals:   04/14/15 1044  BP: 123/86  Pulse: 90  Temp: 36.4 C  Resp: 18    Complications: No apparent anesthesia complications

## 2015-04-14 NOTE — Op Note (Signed)
[facility] Gastroenterology Patient Name: [name] Procedure Date: 04/14/2015 11:37 AM MRN: [id number] Account #: [id number] Date of Birth: [birth year] Admit Type: Outpatient Age: 70 Room: [facility] ENDO ROOM 3 Gender: Female Note Status: Finalized Procedure:            Colonoscopy Indications:          Personal history of colonic polyps Providers:            Lollie Sails, MD Referring MD:         Leonie Douglas. Doy Hutching, MD (Referring MD) Medicines:            Monitored Anesthesia Care Complications:        No immediate complications. Some mild barotrauma effect                        noted in the proximal ascending and cecum limited to                        the mucosa. Procedure:            Pre-Anesthesia Assessment:                       - ASA Grade Assessment: II - A patient with mild                        systemic disease.                       After obtaining informed consent, the colonoscope was                        passed under direct vision. Throughout the procedure,                        the patient's blood pressure, pulse, and oxygen                        saturations were monitored continuously. The                        Colonoscope was introduced through the anus and                        advanced to the the cecum, identified by appendiceal                        orifice and ileocecal valve. The quality of the bowel                        preparation was good. Findings:      The colon (entire examined portion) was significantly redundant.      The sigmoid colon, descending colon, splenic flexure, transverse colon       and hepatic flexure were significantly tortuous.      The exam was otherwise normal throughout the examined colon.      The digital rectal exam was normal. Impression:           - Redundant colon.                       - Tortuous colon.                       -  No specimens collected. Recommendation:       - Repeat colonoscopy  in 5 years for surveillance. Procedure Code(s):    --- Professional ---                       [phone], Colonoscopy, flexible; diagnostic, including                        collection of specimen(s) by brushing or washing, when                        performed (separate procedure) Diagnosis Code(s):    --- Professional ---                       Q43.8, Other specified congenital malformations of                        intestine                       Z86.010, Personal history of colonic polyps CPT copyright 2016 American Medical Association. All rights reserved. The codes documented in this report are preliminary and upon coder review may  be revised to meet current compliance requirements. Lollie Sails, MD 04/14/2015 12:17:21 PM This report has been signed electronically. Number of Addenda: 0 Note Initiated On: 04/14/2015 11:37 AM Scope Withdrawal Time: 0 hours 5 minutes 26 seconds  Total Procedure Duration: 0 hours 28 minutes 36 seconds       [facility]

## 2015-04-15 ENCOUNTER — Encounter: Payer: Self-pay | Admitting: Gastroenterology

## 2015-06-18 ENCOUNTER — Encounter: Payer: Self-pay | Admitting: Podiatry

## 2015-06-18 ENCOUNTER — Ambulatory Visit (INDEPENDENT_AMBULATORY_CARE_PROVIDER_SITE_OTHER): Payer: Managed Care, Other (non HMO) | Admitting: Podiatry

## 2015-06-18 ENCOUNTER — Ambulatory Visit (INDEPENDENT_AMBULATORY_CARE_PROVIDER_SITE_OTHER): Payer: Managed Care, Other (non HMO)

## 2015-06-18 DIAGNOSIS — M19072 Primary osteoarthritis, left ankle and foot: Secondary | ICD-10-CM

## 2015-06-18 DIAGNOSIS — R52 Pain, unspecified: Secondary | ICD-10-CM

## 2015-06-18 DIAGNOSIS — M779 Enthesopathy, unspecified: Secondary | ICD-10-CM | POA: Diagnosis not present

## 2015-06-18 DIAGNOSIS — M25572 Pain in left ankle and joints of left foot: Secondary | ICD-10-CM

## 2015-06-18 DIAGNOSIS — M7752 Other enthesopathy of left foot: Secondary | ICD-10-CM

## 2015-06-21 NOTE — Progress Notes (Signed)
Patient ID: Sheryl Kennedy, female   DOB: 04-Jun-1945, 70 y.o.   MRN: [id number]  Subjective: 70 year old female presents the office they for recurrent left ankle/foot pain. She says the pain in the same area that she had previously and injections did well up until the last couple weeks she should have recurrence of pain. She denies any recent injury or trauma. She has been wearing the brace which does help. No redness or warmth to her foot.  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 There is discomfort with subtalar joint range of motion and there is restriction of motion. Ankle joint range of motion is intact without any crepitation or pain with range of motion. There is some mild diffuse discomfort along the midfoot however this is not the area of is causing her the majority of pain. There is no area pinpoint bony tenderness or pain the vibratory sensation. There is mild chronic edema to the left ankle and leg. There is no pain with calf compression, erythema, warmth. No open lesions or pre-ulcerative lesions.   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 Senna feet arthritic changes are present. No evidence of acute fracture. -At this time the patient is requesting a steroid injection.  Under sterile conditions a total of 2 mL of a mixture of dexamethasone phosphate and 2% lidocaine plain was infiltrated into the lateral aspect of the sinus tarsi without complications. A Band-Aid was then applied. Postinjection  care was discussed the patient. Patient tolerated injection well any complications. -Continue orthotics. Brace if needed. -Follow-up as needed  Celesta Gentile, DPM

## 2015-08-28 ENCOUNTER — Other Ambulatory Visit: Payer: Self-pay | Admitting: Internal Medicine

## 2015-08-28 DIAGNOSIS — N183 Chronic kidney disease, stage 3 unspecified: Secondary | ICD-10-CM

## 2015-08-28 DIAGNOSIS — I878 Other specified disorders of veins: Secondary | ICD-10-CM

## 2015-09-14 ENCOUNTER — Ambulatory Visit
Admission: RE | Admit: 2015-09-14 | Discharge: 2015-09-14 | Disposition: A | Discharge status: Home / Self Care | Payer: Managed Care, Other (non HMO) | Source: Ambulatory Visit | Attending: Internal Medicine | Admitting: Internal Medicine

## 2015-09-14 DIAGNOSIS — N183 Chronic kidney disease, stage 3 unspecified: Secondary | ICD-10-CM

## 2015-09-14 DIAGNOSIS — I878 Other specified disorders of veins: Secondary | ICD-10-CM | POA: Insufficient documentation

## 2015-10-14 ENCOUNTER — Other Ambulatory Visit: Payer: Self-pay | Admitting: Internal Medicine

## 2015-10-14 DIAGNOSIS — Z1231 Encounter for screening mammogram for malignant neoplasm of breast: Secondary | ICD-10-CM

## 2015-10-29 ENCOUNTER — Ambulatory Visit
Admission: RE | Admit: 2015-10-29 | Discharge: 2015-10-29 | Disposition: A | Discharge status: Home / Self Care | Payer: Managed Care, Other (non HMO) | Source: Ambulatory Visit | Attending: Internal Medicine | Admitting: Internal Medicine

## 2015-10-29 DIAGNOSIS — Z1231 Encounter for screening mammogram for malignant neoplasm of breast: Secondary | ICD-10-CM | POA: Insufficient documentation

## 2015-11-02 ENCOUNTER — Other Ambulatory Visit: Payer: Self-pay | Admitting: Internal Medicine

## 2015-11-02 DIAGNOSIS — N6310 Unspecified lump in the right breast, unspecified quadrant: Secondary | ICD-10-CM

## 2015-11-23 ENCOUNTER — Other Ambulatory Visit: Payer: Self-pay | Admitting: Physician Assistant

## 2015-11-23 ENCOUNTER — Ambulatory Visit
Admission: RE | Admit: 2015-11-23 | Discharge: 2015-11-23 | Disposition: A | Discharge status: Home / Self Care | Payer: Managed Care, Other (non HMO) | Source: Ambulatory Visit | Attending: Internal Medicine | Admitting: Internal Medicine

## 2015-11-23 DIAGNOSIS — N6310 Unspecified lump in the right breast, unspecified quadrant: Secondary | ICD-10-CM

## 2015-11-23 DIAGNOSIS — M2392 Unspecified internal derangement of left knee: Secondary | ICD-10-CM

## 2015-11-24 ENCOUNTER — Other Ambulatory Visit: Payer: Self-pay | Admitting: Internal Medicine

## 2015-11-24 DIAGNOSIS — N6310 Unspecified lump in the right breast, unspecified quadrant: Secondary | ICD-10-CM

## 2015-12-04 ENCOUNTER — Ambulatory Visit
Admission: RE | Admit: 2015-12-04 | Discharge: 2015-12-04 | Disposition: A | Discharge status: Home / Self Care | Payer: Managed Care, Other (non HMO) | Source: Ambulatory Visit | Attending: Physician Assistant | Admitting: Physician Assistant

## 2015-12-04 DIAGNOSIS — M1712 Unilateral primary osteoarthritis, left knee: Secondary | ICD-10-CM | POA: Diagnosis not present

## 2015-12-04 DIAGNOSIS — M25462 Effusion, left knee: Secondary | ICD-10-CM | POA: Diagnosis not present

## 2015-12-04 DIAGNOSIS — M2392 Unspecified internal derangement of left knee: Secondary | ICD-10-CM | POA: Diagnosis present

## 2015-12-09 ENCOUNTER — Ambulatory Visit
Admission: RE | Admit: 2015-12-09 | Discharge: 2015-12-09 | Disposition: A | Discharge status: Home / Self Care | Payer: Managed Care, Other (non HMO) | Source: Ambulatory Visit | Attending: Internal Medicine | Admitting: Internal Medicine

## 2015-12-09 DIAGNOSIS — N62 Hypertrophy of breast: Secondary | ICD-10-CM | POA: Diagnosis not present

## 2015-12-09 DIAGNOSIS — N6310 Unspecified lump in the right breast, unspecified quadrant: Secondary | ICD-10-CM

## 2015-12-09 HISTORY — PX: BREAST BIOPSY: SHX20

## 2015-12-10 LAB — SURGICAL PATHOLOGY

## 2015-12-15 ENCOUNTER — Encounter: Payer: Self-pay | Admitting: Obstetrics and Gynecology

## 2016-01-15 ENCOUNTER — Encounter
Admission: RE | Admit: 2016-01-15 | Discharge: 2016-01-15 | Disposition: A | Discharge status: Home / Self Care | Payer: Medicare HMO | Source: Ambulatory Visit | Attending: Orthopedic Surgery | Admitting: Orthopedic Surgery

## 2016-01-15 DIAGNOSIS — I10 Essential (primary) hypertension: Secondary | ICD-10-CM | POA: Diagnosis not present

## 2016-01-15 DIAGNOSIS — Z01810 Encounter for preprocedural cardiovascular examination: Secondary | ICD-10-CM | POA: Insufficient documentation

## 2016-01-15 DIAGNOSIS — Z01812 Encounter for preprocedural laboratory examination: Secondary | ICD-10-CM | POA: Insufficient documentation

## 2016-01-15 HISTORY — DX: Hyperlipidemia, unspecified: E78.5

## 2016-01-15 HISTORY — DX: Gout, unspecified: M10.9

## 2016-01-15 HISTORY — DX: Allergy status to unspecified drugs, medicaments and biological substances: Z88.9

## 2016-01-15 LAB — CBC
HCT: 42 % (ref 35.0–47.0)
Hemoglobin: 13.9 g/dL (ref 12.0–16.0)
MCH: 30.9 pg (ref 26.0–34.0)
MCHC: 33.1 g/dL (ref 32.0–36.0)
MCV: 93.4 fL (ref 80.0–100.0)
Platelets: 282 10*3/uL (ref 150–440)
RBC: 4.49 MIL/uL (ref 3.80–5.20)
RDW: 16 % — ABNORMAL HIGH (ref 11.5–14.5)
WBC: 8.9 10*3/uL (ref 3.6–11.0)

## 2016-01-15 LAB — POTASSIUM: Potassium: 3.5 mmol/L (ref 3.5–5.1)

## 2016-01-15 NOTE — Pre-Procedure Instructions (Signed)
Component Value Ref Range  Vent Rate (bpm) 95   PR Interval (msec) 134   QRS Interval (msec) 70   QT Interval (msec) 338   QTc (msec) 424    ECG 12-lead (11/10/2015 2:30 PM)  Specimen Performing Laboratory   [facility] GE MUSE RESULTS    ECG 12-lead (11/10/2015 2:30 PM)  Narrative  Normal sinus rhythm  Possible Left atrial enlargement  Left ventricular hypertrophy    Abnormal ECG  No previous ECGs available  I reviewed and concur with this report. Electronically signed [id number] MD, KEN ([id number]) on 11/13/2015 7:37:35 AM

## 2016-01-15 NOTE — Patient Instructions (Signed)
  Your procedure is scheduled on: January 22, 2016 (Friday) Report to Same Day Surgery 2nd floor medical mall [facility] Entrance-take elevator on left to 2nd floor.  Check in with surgery information desk.) To find out your arrival time please call [phone] between 1PM - 3PM on January 21, 2016 (Thursday)  Remember: Instructions that are not followed completely may result in serious medical risk, up to and including death, or upon the discretion of your surgeon and anesthesiologist your surgery may need to be rescheduled.    _x___ 1. Do not eat food or drink liquids after midnight. No gum chewing or hard candies.     __x__ 2. No Alcohol for 24 hours before or after surgery.   __x__3. No Smoking for 24 prior to surgery.   ____  4. Bring all medications with you on the day of surgery if instructed.    __x__ 5. Notify your doctor if there is any change in your medical condition     (cold, fever, infections).     Do not wear jewelry, make-up, hairpins, clips or nail polish.  Do not wear lotions, powders, or perfumes. You may wear deodorant.  Do not shave 48 hours prior to surgery. Men may shave face and neck.  Do not bring valuables to the hospital.    [facility] is not responsible for any belongings or valuables.               Contacts, dentures or bridgework may not be worn into surgery.  Leave your suitcase in the car. After surgery it may be brought to your room.  For patients admitted to the hospital, discharge time is determined by your treatment team.   Patients discharged the day of surgery will not be allowed to drive home.  You will need someone to drive you home and stay with you the night of your procedure.    Please read over the following fact sheets that you were given:   [facility] Preparing for Surgery and or MRSA Information   _x___ Take these medicines the morning of surgery with A SIP OF WATER:    1. METOPROLOL  2.  3.  4.  5.  6.  ____Fleets enema  or Magnesium Citrate as directed.   _x___ Use CHG Soap or sage wipes as directed on instruction sheet   ____ Use inhalers on the day of surgery and bring to hospital day of surgery  ____ Stop metformin 2 days prior to surgery    ____ Take 1/2 of usual insulin dose the night before surgery and none on the morning of surgery          __x__ Stop Aspirin, Coumadin, Pllavix ,Eliquis, Effient, or Pradaxa (Patient stopped Aspirin today--January 12 )  x__ Stop Anti-inflammatories such as Advil, Aleve, Ibuprofen, Motrin, Naproxen,          Naprosyn, Goodies powders or aspirin products. Ok to take Tylenol. (Stop Ibuprofen today)   _x___ Stop supplements until after surgery.  (Stop Estroven, Vitamin B-12, and Turmeric today)  ____ Bring C-Pap to the hospital.

## 2016-01-19 ENCOUNTER — Encounter: Payer: Self-pay | Admitting: Obstetrics and Gynecology

## 2016-01-22 ENCOUNTER — Ambulatory Visit: Payer: Medicare HMO | Admitting: Anesthesiology

## 2016-01-22 ENCOUNTER — Ambulatory Visit
Admission: RE | Admit: 2016-01-22 | Discharge: 2016-01-22 | Disposition: A | Discharge status: Home / Self Care | Payer: Medicare HMO | Source: Ambulatory Visit | Attending: Orthopedic Surgery | Admitting: Orthopedic Surgery

## 2016-01-22 ENCOUNTER — Encounter: Payer: Self-pay | Admitting: Anesthesiology

## 2016-01-22 ENCOUNTER — Encounter
Admission: RE | Disposition: A | Discharge status: Home / Self Care | Payer: Self-pay | Source: Ambulatory Visit | Attending: Orthopedic Surgery

## 2016-01-22 DIAGNOSIS — Z91013 Allergy to seafood: Secondary | ICD-10-CM | POA: Diagnosis not present

## 2016-01-22 DIAGNOSIS — M23252 Derangement of posterior horn of lateral meniscus due to old tear or injury, left knee: Secondary | ICD-10-CM | POA: Diagnosis not present

## 2016-01-22 DIAGNOSIS — Z8249 Family history of ischemic heart disease and other diseases of the circulatory system: Secondary | ICD-10-CM | POA: Diagnosis not present

## 2016-01-22 DIAGNOSIS — M94262 Chondromalacia, left knee: Secondary | ICD-10-CM | POA: Insufficient documentation

## 2016-01-22 DIAGNOSIS — Z9889 Other specified postprocedural states: Secondary | ICD-10-CM | POA: Diagnosis not present

## 2016-01-22 DIAGNOSIS — Z7982 Long term (current) use of aspirin: Secondary | ICD-10-CM | POA: Diagnosis not present

## 2016-01-22 DIAGNOSIS — Z886 Allergy status to analgesic agent status: Secondary | ICD-10-CM | POA: Insufficient documentation

## 2016-01-22 DIAGNOSIS — Z800 Family history of malignant neoplasm of digestive organs: Secondary | ICD-10-CM | POA: Diagnosis not present

## 2016-01-22 DIAGNOSIS — I739 Peripheral vascular disease, unspecified: Secondary | ICD-10-CM | POA: Insufficient documentation

## 2016-01-22 DIAGNOSIS — Z882 Allergy status to sulfonamides status: Secondary | ICD-10-CM | POA: Diagnosis not present

## 2016-01-22 DIAGNOSIS — Z888 Allergy status to other drugs, medicaments and biological substances status: Secondary | ICD-10-CM | POA: Insufficient documentation

## 2016-01-22 DIAGNOSIS — M2392 Unspecified internal derangement of left knee: Secondary | ICD-10-CM | POA: Diagnosis present

## 2016-01-22 DIAGNOSIS — N183 Chronic kidney disease, stage 3 (moderate): Secondary | ICD-10-CM | POA: Diagnosis not present

## 2016-01-22 DIAGNOSIS — M23242 Derangement of anterior horn of lateral meniscus due to old tear or injury, left knee: Secondary | ICD-10-CM | POA: Insufficient documentation

## 2016-01-22 DIAGNOSIS — I129 Hypertensive chronic kidney disease with stage 1 through stage 4 chronic kidney disease, or unspecified chronic kidney disease: Secondary | ICD-10-CM | POA: Diagnosis not present

## 2016-01-22 DIAGNOSIS — M25462 Effusion, left knee: Secondary | ICD-10-CM | POA: Insufficient documentation

## 2016-01-22 DIAGNOSIS — M23222 Derangement of posterior horn of medial meniscus due to old tear or injury, left knee: Secondary | ICD-10-CM | POA: Diagnosis not present

## 2016-01-22 DIAGNOSIS — M109 Gout, unspecified: Secondary | ICD-10-CM | POA: Diagnosis not present

## 2016-01-22 DIAGNOSIS — M1712 Unilateral primary osteoarthritis, left knee: Secondary | ICD-10-CM | POA: Insufficient documentation

## 2016-01-22 DIAGNOSIS — Z96651 Presence of right artificial knee joint: Secondary | ICD-10-CM | POA: Insufficient documentation

## 2016-01-22 DIAGNOSIS — E785 Hyperlipidemia, unspecified: Secondary | ICD-10-CM | POA: Diagnosis not present

## 2016-01-22 DIAGNOSIS — Z79899 Other long term (current) drug therapy: Secondary | ICD-10-CM | POA: Insufficient documentation

## 2016-01-22 HISTORY — PX: CHONDROPLASTY: SHX5177

## 2016-01-22 HISTORY — PX: KNEE ARTHROSCOPY WITH MEDIAL MENISECTOMY: SHX5651

## 2016-01-22 SURGERY — ARTHROSCOPY, KNEE, WITH MEDIAL MENISCECTOMY
Anesthesia: General | Site: Knee | Laterality: Left | Wound class: Clean

## 2016-01-22 MED ORDER — FENTANYL CITRATE (PF) 100 MCG/2ML IJ SOLN
INTRAMUSCULAR | Status: AC
  Filled 2016-01-22: qty 2

## 2016-01-22 MED ORDER — FENTANYL CITRATE (PF) 100 MCG/2ML IJ SOLN
25.0000 ug | INTRAMUSCULAR | Status: AC | PRN
  Administered 2016-01-22 (×6): 25 ug via INTRAVENOUS

## 2016-01-22 MED ORDER — PHENYLEPHRINE HCL 10 MG/ML IJ SOLN
INTRAMUSCULAR | Status: DC | PRN
  Administered 2016-01-22 (×2): 100 ug via INTRAVENOUS

## 2016-01-22 MED ORDER — FENTANYL CITRATE (PF) 100 MCG/2ML IJ SOLN
INTRAMUSCULAR | Status: DC | PRN
  Administered 2016-01-22: 100 ug via INTRAVENOUS

## 2016-01-22 MED ORDER — METOCLOPRAMIDE HCL 5 MG/ML IJ SOLN: 5.0000 mg | Freq: Three times a day (TID) | INTRAMUSCULAR | Status: DC | PRN

## 2016-01-22 MED ORDER — LIDOCAINE HCL (CARDIAC) 20 MG/ML IV SOLN
INTRAVENOUS | Status: DC | PRN
  Administered 2016-01-22: 100 mg via INTRAVENOUS

## 2016-01-22 MED ORDER — MIDAZOLAM HCL 2 MG/2ML IJ SOLN
INTRAMUSCULAR | Status: DC | PRN
  Administered 2016-01-22: 2 mg via INTRAVENOUS

## 2016-01-22 MED ORDER — CEFAZOLIN SODIUM 1 G IJ SOLR
INTRAMUSCULAR | Status: DC | PRN
  Administered 2016-01-22: 2 g via INTRAMUSCULAR

## 2016-01-22 MED ORDER — MIDAZOLAM HCL 2 MG/2ML IJ SOLN
INTRAMUSCULAR | Status: AC
  Filled 2016-01-22: qty 2

## 2016-01-22 MED ORDER — MORPHINE SULFATE (PF) 4 MG/ML IV SOLN
INTRAVENOUS | Status: DC | PRN
  Administered 2016-01-22: 4 mg via INTRAVENOUS

## 2016-01-22 MED ORDER — DEXAMETHASONE SODIUM PHOSPHATE 10 MG/ML IJ SOLN
INTRAMUSCULAR | Status: AC
Start: 2016-01-22 — End: 2016-01-22
  Filled 2016-01-22: qty 1

## 2016-01-22 MED ORDER — ONDANSETRON HCL 4 MG/2ML IJ SOLN: 4.0000 mg | Freq: Once | INTRAMUSCULAR | Status: DC | PRN

## 2016-01-22 MED ORDER — SEVOFLURANE IN SOLN
RESPIRATORY_TRACT | Status: AC
  Filled 2016-01-22: qty 250

## 2016-01-22 MED ORDER — FAMOTIDINE 20 MG PO TABS
ORAL_TABLET | ORAL | Status: AC
  Administered 2016-01-22: 20 mg via ORAL
  Filled 2016-01-22: qty 1

## 2016-01-22 MED ORDER — PROPOFOL 10 MG/ML IV BOLUS
INTRAVENOUS | Status: DC | PRN
  Administered 2016-01-22: 150 mg via INTRAVENOUS

## 2016-01-22 MED ORDER — ONDANSETRON HCL 4 MG PO TABS: 4.0000 mg | ORAL_TABLET | Freq: Four times a day (QID) | ORAL | Status: DC | PRN

## 2016-01-22 MED ORDER — LACTATED RINGERS IV SOLN
INTRAVENOUS | Status: DC
  Administered 2016-01-22: 07:00:00 via INTRAVENOUS
  Administered 2016-01-22: 50 mL/h via INTRAVENOUS

## 2016-01-22 MED ORDER — HYDROCODONE-ACETAMINOPHEN 5-325 MG PO TABS
1.0000 | ORAL_TABLET | ORAL | 0 refills | Status: DC | PRN
Start: 2016-01-22 — End: 2016-10-18

## 2016-01-22 MED ORDER — PROPOFOL 10 MG/ML IV BOLUS
INTRAVENOUS | Status: AC
  Filled 2016-01-22: qty 20

## 2016-01-22 MED ORDER — ACETAMINOPHEN 10 MG/ML IV SOLN
INTRAVENOUS | Status: DC | PRN
  Administered 2016-01-22: 1000 mg via INTRAVENOUS

## 2016-01-22 MED ORDER — LIDOCAINE HCL (PF) 2 % IJ SOLN
INTRAMUSCULAR | Status: AC
  Filled 2016-01-22: qty 2

## 2016-01-22 MED ORDER — ONDANSETRON HCL 4 MG/2ML IJ SOLN
INTRAMUSCULAR | Status: DC | PRN
Start: 2016-01-22 — End: 2016-01-22
  Administered 2016-01-22: 4 mg via INTRAVENOUS

## 2016-01-22 MED ORDER — SODIUM CHLORIDE 0.9 % IV SOLN
INTRAVENOUS | Status: DC
Start: 2016-01-22 — End: 2016-01-22

## 2016-01-22 MED ORDER — HYDROCODONE-ACETAMINOPHEN 5-325 MG PO TABS
ORAL_TABLET | ORAL | Status: AC
  Filled 2016-01-22: qty 1

## 2016-01-22 MED ORDER — MORPHINE SULFATE (PF) 4 MG/ML IV SOLN
INTRAVENOUS | Status: AC
  Filled 2016-01-22: qty 1

## 2016-01-22 MED ORDER — ONDANSETRON HCL 4 MG/2ML IJ SOLN
INTRAMUSCULAR | Status: AC
  Filled 2016-01-22: qty 2

## 2016-01-22 MED ORDER — PHENYLEPHRINE HCL 10 MG/ML IJ SOLN
INTRAMUSCULAR | Status: AC
  Filled 2016-01-22: qty 1

## 2016-01-22 MED ORDER — DEXAMETHASONE SODIUM PHOSPHATE 10 MG/ML IJ SOLN
INTRAMUSCULAR | Status: DC | PRN
  Administered 2016-01-22: 10 mg via INTRAVENOUS

## 2016-01-22 MED ORDER — ACETAMINOPHEN 10 MG/ML IV SOLN
INTRAVENOUS | Status: AC
Start: 2016-01-22 — End: 2016-01-22
  Filled 2016-01-22: qty 100

## 2016-01-22 MED ORDER — CHLORHEXIDINE GLUCONATE 4 % EX LIQD: 60.0000 mL | Freq: Once | CUTANEOUS | Status: DC

## 2016-01-22 MED ORDER — BUPIVACAINE-EPINEPHRINE (PF) 0.25% -1:200000 IJ SOLN
INTRAMUSCULAR | Status: AC
  Filled 2016-01-22: qty 30

## 2016-01-22 MED ORDER — FAMOTIDINE 20 MG PO TABS
20.0000 mg | ORAL_TABLET | Freq: Once | ORAL | Status: AC
  Administered 2016-01-22: 20 mg via ORAL

## 2016-01-22 MED ORDER — ONDANSETRON HCL 4 MG/2ML IJ SOLN: 4.0000 mg | Freq: Four times a day (QID) | INTRAMUSCULAR | Status: DC | PRN

## 2016-01-22 MED ORDER — HYDROCODONE-ACETAMINOPHEN 5-325 MG PO TABS
1.0000 | ORAL_TABLET | ORAL | Status: DC | PRN
  Administered 2016-01-22: 1 via ORAL

## 2016-01-22 MED ORDER — METOCLOPRAMIDE HCL 10 MG PO TABS: 5.0000 mg | ORAL_TABLET | Freq: Three times a day (TID) | ORAL | Status: DC | PRN

## 2016-01-22 MED ORDER — BUPIVACAINE-EPINEPHRINE 0.25% -1:200000 IJ SOLN
INTRAMUSCULAR | Status: DC | PRN
  Administered 2016-01-22: 5 mL
  Administered 2016-01-22: 25 mL

## 2016-01-22 SURGICAL SUPPLY — 23 items
BLADE SHAVER 4.5 DBL SERAT CV (CUTTER) ×8 IMPLANT
BNDG ESMARK 6X12 TAN STRL LF (GAUZE/BANDAGES/DRESSINGS) ×4 IMPLANT
CUFF TOURN 24 STER (MISCELLANEOUS) ×4 IMPLANT
CUFF TOURN 30 STER DUAL PORT (MISCELLANEOUS) IMPLANT
DRSG DERMACEA 8X12 NADH (GAUZE/BANDAGES/DRESSINGS) ×4 IMPLANT
DURAPREP 26ML APPLICATOR (WOUND CARE) ×8 IMPLANT
GAUZE SPONGE 4X4 12PLY STRL (GAUZE/BANDAGES/DRESSINGS) ×4 IMPLANT
GLOVE BIOGEL M STRL SZ7.5 (GLOVE) ×4 IMPLANT
GLOVE INDICATOR 8.0 STRL GRN (GLOVE) ×4 IMPLANT
GOWN STRL REUS W/ TWL LRG LVL3 (GOWN DISPOSABLE) ×4 IMPLANT
GOWN STRL REUS W/TWL LRG LVL3 (GOWN DISPOSABLE) ×4
IV LACTATED RINGER IRRG 3000ML (IV SOLUTION) ×12
IV LR IRRIG 3000ML ARTHROMATIC (IV SOLUTION) ×12 IMPLANT
KIT RM TURNOVER STRD PROC AR (KITS) ×4 IMPLANT
MANIFOLD NEPTUNE II (INSTRUMENTS) ×4 IMPLANT
PACK ARTHROSCOPY KNEE (MISCELLANEOUS) ×4 IMPLANT
SET TUBE SUCT SHAVER OUTFL 24K (TUBING) ×4 IMPLANT
SET TUBE TIP INTRA-ARTICULAR (MISCELLANEOUS) ×4 IMPLANT
SUT ETHILON 3-0 FS-10 30 BLK (SUTURE) ×4
SUTURE EHLN 3-0 FS-10 30 BLK (SUTURE) ×2 IMPLANT
TUBING ARTHRO INFLOW-ONLY STRL (TUBING) ×4 IMPLANT
WAND HAND CNTRL MULTIVAC 50 (MISCELLANEOUS) ×4 IMPLANT
WRAP KNEE W/COLD PACKS 25.5X14 (SOFTGOODS) ×4 IMPLANT

## 2016-01-22 NOTE — Brief Op Note (Signed)
01/22/2016  9:18 AM  PATIENT:  Sheryl Kennedy  71 y.o. 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  PROCEDURE:  Procedure(s): KNEE ARTHROSCOPY WITH MEDIAL AND LATERAL MENISECTOMY (Left) arthroscopic medial AND LATERAL CHONDROPLASTY (Left)  SURGEON:  Surgeon(s) and Role:    * Dereck Leep, MD - Primary  ASSISTANTS: none   ANESTHESIA:   general  EBL:  Total I/O In: 800 [I.V.:800] Out: 5 [Blood:5]  BLOOD ADMINISTERED:none  DRAINS: none   LOCAL MEDICATIONS USED:  MARCAINE     SPECIMEN:  No Specimen  DISPOSITION OF SPECIMEN:  N/A  COUNTS:  YES  TOURNIQUET:  * No tourniquets in log *  DICTATION: .Dragon Dictation  PLAN OF CARE: Discharge to home after PACU  PATIENT DISPOSITION:  PACU - hemodynamically stable.   Delay start of Pharmacological VTE agent (>24hrs) due to surgical blood loss or risk of bleeding: not applicable

## 2016-01-22 NOTE — Anesthesia Postprocedure Evaluation (Signed)
Anesthesia Post Note  Patient: Sheryl Kennedy  Procedure(s) Performed: Procedure(s) (LRB): KNEE ARTHROSCOPY WITH MEDIAL AND LATERAL MENISECTOMY (Left) arthroscopic medial AND LATERAL CHONDROPLASTY (Left)  Patient location during evaluation: PACU Anesthesia Type: General Level of consciousness: awake and alert Pain management: pain level controlled Vital Signs Assessment: post-procedure vital signs reviewed and stable Respiratory status: spontaneous breathing, nonlabored ventilation, respiratory function stable and patient connected to nasal cannula oxygen Cardiovascular status: blood pressure returned to baseline and stable Postop Assessment: no signs of nausea or vomiting Anesthetic complications: no     Last Vitals:  Vitals:   01/22/16 1015 01/22/16 1053  BP: (!) 142/74 134/71  Pulse: 84 82  Resp: 16 16  Temp: 36.8 C     Last Pain:  Vitals:   01/22/16 1053  TempSrc:   PainSc: [place]

## 2016-01-22 NOTE — Discharge Instructions (Signed)
AMBULATORY SURGERY  DISCHARGE INSTRUCTIONS   1) The drugs that you were given will stay in your system until tomorrow so for the next 24 hours you should not:  A) Drive an automobile B) Make any legal decisions C) Drink any alcoholic beverage   2) You may resume regular meals tomorrow.  Today it is better to start with liquids and gradually work up to solid foods.  You may eat anything you prefer, but it is better to start with liquids, then soup and crackers, and gradually work up to solid foods.   3) Please notify your doctor immediately if you have any unusual bleeding, trouble breathing, redness and pain at the surgery site, drainage, fever, or pain not relieved by medication.  4) Your post-operative visit with Dr.                                     is: Date:                        Time:    Please call to schedule your post-operative visit.  5) Additional Instructions:  Instructions after Knee Arthroscopy    James P. Holley Bouche., M.D.     Dept. of [facility]  [address] [place], [place]  [address]   Phone: [phone]   Fax: [phone]   DIET:  Drink plenty of non-alcoholic fluids & begin a light diet.  Resume your normal diet the day after surgery.  ACTIVITY:   You may use crutches or a walker with weight-bearing as tolerated, unless instructed otherwise.  You may wean yourself off of the walker or crutches as tolerated.   Begin doing gentle exercises. Exercising will reduce the pain and swelling, increase motion, and prevent muscle weakness.    Avoid strenuous activities or athletics for a minimum of 4-6 weeks after arthroscopic surgery.  Do not drive or operate any equipment until instructed.  WOUND CARE:   Place one to two pillows under the knee the first day or two when sitting or lying.   Continue to use the ice packs periodically to reduce pain and swelling.  The small incisions in your knee are  closed with nylon stitches. The stitches will be removed in the office.  The bulky dressing may be removed on the second day after surgery. DO NOT TOUCH THE STITCHES. Put a Band-Aid over each stitch. Do NOT use any ointments or creams on the incisions.   You may bathe or shower after the stitches are removed at the first office visit following surgery.  MEDICATIONS:  You may resume your regular medications.  Please take the pain medication as prescribed.  Do not take pain medication on an empty stomach.  Do not drive or drink alcoholic beverages when taking pain medications.  CALL THE OFFICE FOR:  Temperature above 101 degrees  Excessive bleeding or drainage on the dressing.  Excessive swelling, coldness, or paleness of the toes.  Persistent nausea and vomiting.  FOLLOW-UP:   You should have an appointment to return to the office in 7-10 days after surgery.

## 2016-01-22 NOTE — H&P (Signed)
The patient has been re-examined, and the chart reviewed, and there have been no interval changes to the documented history and physical.    The risks, benefits, and alternatives have been discussed at length. The patient expressed understanding of the risks benefits and agreed with plans for surgical intervention.  James P. Hooten, Jr. M.D.    

## 2016-01-22 NOTE — Anesthesia Post-op Follow-up Note (Cosign Needed)
Anesthesia QCDR form completed.        

## 2016-01-22 NOTE — Transfer of Care (Signed)
Immediate Anesthesia Transfer of Care Note  Patient: Sheryl Kennedy  Procedure(s) Performed: Procedure(s): KNEE ARTHROSCOPY WITH MEDIAL AND LATERAL MENISECTOMY (Left) arthroscopic medial AND LATERAL CHONDROPLASTY (Left)  Patient Location: PACU  Anesthesia Type:General  Level of Consciousness: sedated  Airway & Oxygen Therapy: Patient Spontanous Breathing and Patient connected to face mask oxygen  Post-op Assessment: Report given to RN and Post -op Vital signs reviewed and stable  Post vital signs: Reviewed and stable  Last Vitals:  Vitals:   01/22/16 0641  BP: 126/67  Pulse: 91  Resp: 16  Temp: 36.6 C    Last Pain:  Vitals:   01/22/16 0641  TempSrc: Tympanic  PainSc:       Patients Stated Pain Goal: 0 (51/89/84 2103)  Complications: No apparent anesthesia complications

## 2016-01-22 NOTE — Anesthesia Procedure Notes (Signed)
Procedure Name: LMA Insertion Date/Time: 01/22/2016 7:58 AM Performed by: Nelda Marseille Pre-anesthesia Checklist: Patient identified, Patient being monitored, Timeout performed, Emergency Drugs available and Suction available Patient Re-evaluated:Patient Re-evaluated prior to inductionOxygen Delivery Method: Circle system utilized Preoxygenation: Pre-oxygenation with 100% oxygen Intubation Type: IV induction Ventilation: Mask ventilation without difficulty LMA: LMA inserted LMA Size: 3.5 Tube type: Oral Number of attempts: 1 Placement Confirmation: positive ETCO2 and breath sounds checked- equal and bilateral Tube secured with: Tape Dental Injury: Teeth and Oropharynx as per pre-operative assessment

## 2016-01-22 NOTE — Op Note (Signed)
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   ASSISTANT: none  ANESTHESIA: general  ESTIMATED BLOOD LOSS: Minimal  FLUIDS REPLACED: Minimal  TOURNIQUET TIME: Not used   DRAINS: none  IMPLANTS UTILIZED: None  INDICATIONS FOR SURGERY: Sheryl Kennedy is a 71 y.o. year old female who has been seen for complaints of left knee pain. MRI demonstrated findings consistent with meniscal pathology. After discussion of the risks and benefits of surgical intervention, the patient expressed understanding of the risks benefits and agree with plans for left knee arthroscopy.   PROCEDURE IN DETAIL: The patient was brought into the operating room and, after adequate general anesthesia was achieved, a tourniquet was applied to the left thigh and the leg was placed in the leg holder. All bony prominences were well padded. The patient's left knee was cleaned and prepped with alcohol and Duraprep and draped in the usual sterile fashion. A "timeout" was performed as per usual protocol. The anticipated portal sites were injected with 0.25% Marcaine with epinephrine. An anterolateral incision was made and a cannula was inserted. A large effusion was evacuated and the knee was distended with fluid using the pump. The scope was advanced down the medial gutter into the medial compartment. Under visualization with the scope, an anteromedial portal was created and a  hooked probe was inserted. The medial meniscus was visualized and probed. There was a degenerative tear of the posterior horn of the medial meniscus. The tear was debrided using meniscal punches and a 4.5 mm incisor shaver. Contouring was performed using the 50 ArthroCare wand. The remaining rim of meniscus was visualized and probed from be stable. The articular cartilage was visualized. There was grade 3 chondromalacia involving primarily the medial femoral condyle. The area was debrided using the ArthroCare wand.  The scope was then advanced into the intercondylar notch. The anterior cruciate ligament was visualized and probed and felt to be intact. The scope was removed from the lateral portal and reinserted via the anteromedial portal to better visualize the lateral compartment. The lateral meniscus was visualized and probed. Severe degenerative tears were noted to both the anterior and posterior horns of the lateral meniscus. The tears were debrided using meniscal punches and a 4.5 mm incisor shaver. Contouring was performed using the ArthroCare wand. The articular cartilage of the lateral compartment was visualized. Grade 4 changes of chondromalacia were noted to the lateral tibial plateau and to the posterior aspect of the lateral femoral condyle. These areas were debrided using the ArthroCare wand. Finally, the scope was advanced so as to visualize the patellofemoral articulation. Good patellar tracking was appreciated. The articular surface was in reasonably good condition.  The knee was irrigated with copius amounts of fluid and suctioned dry. The anterolateral portal was re-approximated with #3-0 nylon. A combination of 0.25% Marcaine with epinephrine and 4 mg of Morphine were injected via the scope. The scope was removed and the anteromedial portal was re-approximated with #  3-0 nylon. A sterile dressing was applied followed by application of an ice wrap.  The patient tolerated the procedure well  and was transported to the PACU in stable condition.  James P. Holley Bouche., M.D.

## 2016-01-22 NOTE — Anesthesia Preprocedure Evaluation (Signed)
Anesthesia Evaluation  Patient identified by MRN, date of birth, ID band Patient awake    Reviewed: Allergy & Precautions, NPO status , Patient's Chart, lab work & pertinent test results, reviewed documented beta blocker date and time   Airway Mallampati: II  TM Distance: >3 FB     Dental  (+) Chipped   Pulmonary           Cardiovascular hypertension, Pt. on medications and Pt. on home beta blockers + Peripheral Vascular Disease       Neuro/Psych    GI/Hepatic   Endo/Other    Renal/GU      Musculoskeletal  (+) Arthritis ,   Abdominal   Peds  Hematology   Anesthesia Other Findings Gout.  Reproductive/Obstetrics                             Anesthesia Physical Anesthesia Plan  ASA: III  Anesthesia Plan: General   Post-op Pain Management:    Induction: Intravenous  Airway Management Planned: LMA  Additional Equipment:   Intra-op Plan:   Post-operative Plan:   Informed Consent: I have reviewed the patients History and Physical, chart, labs and discussed the procedure including the risks, benefits and alternatives for the proposed anesthesia with the patient or authorized representative who has indicated his/her understanding and acceptance.     Plan Discussed with: CRNA  Anesthesia Plan Comments:         Anesthesia Quick Evaluation

## 2016-03-02 ENCOUNTER — Encounter: Payer: Self-pay | Admitting: Obstetrics and Gynecology

## 2016-04-28 ENCOUNTER — Telehealth (INDEPENDENT_AMBULATORY_CARE_PROVIDER_SITE_OTHER): Payer: Self-pay | Admitting: Vascular Surgery

## 2016-04-28 NOTE — Telephone Encounter (Signed)
Pt is to have a left total knee replacement and does have good strong pedal pulses. Is it safe to use a tourniquet. Please advise. Pt would like to have this done as soon as possible. Last notes are in allscripts.

## 2016-04-29 NOTE — Telephone Encounter (Signed)
Yes.  Tourniquet is fine.

## 2016-05-05 DIAGNOSIS — M1712 Unilateral primary osteoarthritis, left knee: Secondary | ICD-10-CM | POA: Insufficient documentation

## 2016-05-13 DIAGNOSIS — Z96652 Presence of left artificial knee joint: Secondary | ICD-10-CM | POA: Insufficient documentation

## 2016-10-18 ENCOUNTER — Ambulatory Visit (INDEPENDENT_AMBULATORY_CARE_PROVIDER_SITE_OTHER): Payer: Medicare HMO | Admitting: Podiatry

## 2016-10-18 ENCOUNTER — Ambulatory Visit (INDEPENDENT_AMBULATORY_CARE_PROVIDER_SITE_OTHER): Payer: Medicare HMO

## 2016-10-18 ENCOUNTER — Other Ambulatory Visit: Payer: Self-pay | Admitting: Podiatry

## 2016-10-18 DIAGNOSIS — M79672 Pain in left foot: Secondary | ICD-10-CM

## 2016-10-18 DIAGNOSIS — M79671 Pain in right foot: Secondary | ICD-10-CM

## 2016-10-18 DIAGNOSIS — M722 Plantar fascial fibromatosis: Secondary | ICD-10-CM

## 2016-10-20 NOTE — Progress Notes (Signed)
   Subjective: Patient presents today for pain and tenderness in bilateral arches and plantar heels, right greater than left. Patient states the foot pain has been hurting for several weeks now. Patient states that it hurts in the mornings with the first steps out of bed. She states she has orthotics but wants them checked. Patient presents today for further treatment and evaluation.   Past Medical History:  Diagnosis Date  . Arthritis   . 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 Gout   . Hx of seasonal allergies   . Hyperlipidemia   . Hypertension   . 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 complications      Objective: Physical Exam General: The patient is alert and oriented x3 in no acute distress.  Dermatology: Skin is warm, dry and supple bilateral lower extremities. Negative for open lesions or macerations bilateral.   Vascular: Dorsalis Pedis and Posterior Tibial pulses palpable bilateral.  Capillary fill time is immediate to all digits.  Neurological: Epicritic and protective threshold intact bilateral.   Musculoskeletal: Tenderness to palpation at the medial calcaneal tubercale and through the insertion of the plantar fascia of the right foot. All other joints range of motion within normal limits bilateral. Strength 5/5 in all groups bilateral.   Radiographic exam: Normal osseous mineralization. Joint spaces preserved. No fracture/dislocation/boney destruction. Calcaneal spur present with mild thickening of plantar fascia right. No other soft tissue abnormalities or radiopaque foreign bodies.   Assessment: 1. Plantar fasciitis right 2. Pain in right foot  Plan of Care:  1. Patient evaluated. Xrays reviewed.   2. Injection of 0.5cc Celestone soluspan injected into the right plantar fascia  3. Appt with Liliane Channel for custom molded orthotics.  4. Continue wearing New Balance shoes.  5. Return to clinic when necessary.    Edrick Kins, DPM [facility]  Dr. Edrick Kins, DPM    [address], [place] [address]                Office [phone]  Fax [phone]

## 2016-10-27 MED ORDER — BETAMETHASONE SOD PHOS & ACET 6 (3-3) MG/ML IJ SUSP: 3.0000 mg | Freq: Once | INTRAMUSCULAR | Status: DC

## 2016-11-09 ENCOUNTER — Ambulatory Visit: Payer: Medicare HMO | Admitting: Orthotics

## 2016-11-09 DIAGNOSIS — M7750 Other enthesopathy of unspecified foot: Secondary | ICD-10-CM

## 2016-11-09 DIAGNOSIS — M25572 Pain in left ankle and joints of left foot: Secondary | ICD-10-CM

## 2016-11-09 NOTE — Progress Notes (Signed)
Patient wants to try and get her old f/o refurbished before spending $300 on new ones.  She brought in an older pair and I ground down the heel 1/8" for her to try; if those are comfortable, then she will bring others by to be refurbished.

## 2017-01-18 ENCOUNTER — Other Ambulatory Visit: Payer: Self-pay | Admitting: Internal Medicine

## 2017-01-18 DIAGNOSIS — Z1231 Encounter for screening mammogram for malignant neoplasm of breast: Secondary | ICD-10-CM

## 2017-02-03 ENCOUNTER — Ambulatory Visit
Admission: RE | Admit: 2017-02-03 | Discharge: 2017-02-03 | Disposition: A | Discharge status: Home / Self Care | Payer: Medicare HMO | Source: Ambulatory Visit | Attending: Internal Medicine | Admitting: Internal Medicine

## 2017-02-03 DIAGNOSIS — Z1231 Encounter for screening mammogram for malignant neoplasm of breast: Secondary | ICD-10-CM | POA: Insufficient documentation

## 2017-06-06 DIAGNOSIS — R0602 Shortness of breath: Secondary | ICD-10-CM | POA: Insufficient documentation

## 2017-06-06 DIAGNOSIS — R0789 Other chest pain: Secondary | ICD-10-CM | POA: Insufficient documentation

## 2018-01-02 ENCOUNTER — Other Ambulatory Visit: Payer: Self-pay | Admitting: Internal Medicine

## 2018-01-02 DIAGNOSIS — Z1231 Encounter for screening mammogram for malignant neoplasm of breast: Secondary | ICD-10-CM

## 2018-02-06 ENCOUNTER — Ambulatory Visit
Admission: RE | Admit: 2018-02-06 | Discharge: 2018-02-06 | Disposition: A | Discharge status: Home / Self Care | Payer: Medicare HMO | Source: Ambulatory Visit | Attending: Internal Medicine | Admitting: Internal Medicine

## 2018-02-06 DIAGNOSIS — Z1231 Encounter for screening mammogram for malignant neoplasm of breast: Secondary | ICD-10-CM | POA: Insufficient documentation

## 2018-03-20 ENCOUNTER — Ambulatory Visit: Payer: Medicare HMO | Admitting: Podiatry

## 2018-03-23 ENCOUNTER — Ambulatory Visit: Payer: Medicare HMO | Admitting: Podiatry

## 2018-03-23 ENCOUNTER — Encounter: Payer: Self-pay | Admitting: Podiatry

## 2018-03-23 ENCOUNTER — Other Ambulatory Visit: Payer: Self-pay

## 2018-03-23 ENCOUNTER — Ambulatory Visit (INDEPENDENT_AMBULATORY_CARE_PROVIDER_SITE_OTHER): Payer: Medicare HMO

## 2018-03-23 DIAGNOSIS — M66272 Spontaneous rupture of extensor tendons, left ankle and foot: Secondary | ICD-10-CM | POA: Diagnosis not present

## 2018-03-23 DIAGNOSIS — M7750 Other enthesopathy of unspecified foot: Secondary | ICD-10-CM

## 2018-03-26 ENCOUNTER — Other Ambulatory Visit: Payer: Self-pay | Admitting: Podiatry

## 2018-03-26 DIAGNOSIS — M66272 Spontaneous rupture of extensor tendons, left ankle and foot: Secondary | ICD-10-CM

## 2018-03-27 NOTE — Progress Notes (Signed)
   HPI: 73 year old female presenting today with a chief complaint of hot, searing pain to the dorsal aspect and arch of the left foot that began 1 week ago. She states she was stretching her legs and felt a pop in the top of the foot. She reports associated swelling and soreness. She has been resting, wearing compression hose, taking Ibuprofen and applying OTC pain cream for treatment. Patient is here for further evaluation and treatment.   Past Medical History:  Diagnosis Date  . Arthritis   . Cancer ([place]) 08/2012   skin right anterior lower leg, squamous cell  . Gout   . Hx of seasonal allergies   . Hyperlipidemia   . Hypertension   . 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 complications      Physical Exam: General: The patient is alert and oriented x3 in no acute distress.  Dermatology: Skin is warm, dry and supple bilateral lower extremities. Negative for open lesions or macerations.  Vascular: Left ankle edema noted. Palpable pedal pulses bilaterally. No erythema noted. Capillary refill within normal limits.  Neurological: Epicritic and protective threshold grossly intact bilaterally.   Musculoskeletal Exam: Pain with palpation and loss of dorsiflexion noted to the left foot.   Radiographic Exam:  Normal osseous mineralization. Joint spaces preserved. No fracture/dislocation/boney destruction.    Assessment: 1. Anterior tibial tendon rupture left 2. Left ankle edema    Plan of Care:  1. Patient evaluated. X-Rays reviewed.  2. MRI of the left foot ordered.  3. Continue taking OTC Motrin as needed.  4. Return to clinic after MRI for surgical consult.      Edrick Kins, DPM [facility]  Dr. Edrick Kins, DPM    [address], [place] [address]                Office [phone]  Fax [phone]

## 2018-03-28 ENCOUNTER — Telehealth: Payer: Self-pay

## 2018-03-28 NOTE — Telephone Encounter (Signed)
-----   Message from Edrick Kins, DPM sent at 03/23/2018 12:46 PM EDT ----- Regarding: MRI left ankle Please order MRI left ankle w/out contrast.   Dx: anterior tibial tendon rupture left ankle  Thanks, Dr. Amalia Hailey

## 2018-03-28 NOTE — Telephone Encounter (Signed)
Dr. Amalia Hailey, MRI can't be scheduled until 4-6 weeks out right now.  Is it ok for her to wait that long or do I need to get her in stat for MRI?  Please advise

## 2018-03-28 NOTE — Telephone Encounter (Signed)
Please label it urgent. She ruptured her tendon and needs surgery to have it fixed. Sooner rather than later.  Thanks, Dr. Amalia Hailey

## 2018-03-29 ENCOUNTER — Telehealth: Payer: Self-pay

## 2018-03-29 DIAGNOSIS — M66272 Spontaneous rupture of extensor tendons, left ankle and foot: Secondary | ICD-10-CM

## 2018-03-29 NOTE — Telephone Encounter (Signed)
MRI has been approved from 03/28/2018 to 09/24/2018 Auth # [id number] Called patient and left message to call office back to discuss MRI appt

## 2018-03-29 NOTE — Telephone Encounter (Signed)
MRI order has been entered in chart as stat.  Patient has been notified and will call Imaging center to set up her own appt.

## 2018-04-03 ENCOUNTER — Other Ambulatory Visit: Payer: Self-pay

## 2018-04-03 ENCOUNTER — Ambulatory Visit
Admission: RE | Admit: 2018-04-03 | Discharge: 2018-04-03 | Disposition: A | Discharge status: Home / Self Care | Payer: Medicare HMO | Source: Ambulatory Visit | Attending: Podiatry | Admitting: Podiatry

## 2018-04-03 DIAGNOSIS — M66272 Spontaneous rupture of extensor tendons, left ankle and foot: Secondary | ICD-10-CM

## 2018-04-06 ENCOUNTER — Telehealth: Payer: Self-pay | Admitting: *Deleted

## 2018-04-06 ENCOUNTER — Telehealth: Payer: Self-pay | Admitting: Podiatry

## 2018-04-06 NOTE — Telephone Encounter (Signed)
Pt requesting MRI Results.

## 2018-04-06 NOTE — Telephone Encounter (Signed)
Patient returned your call regarding MRI results.

## 2018-04-06 NOTE — Telephone Encounter (Signed)
Left a message today on 04/06/2018 as well as yesterday on the patient's home and mobile phone for her to call me back in regards to her MRI results.  No answer.  Left message on 04/05/2018 as well as 04/06/2018.  Edrick Kins, DPM [facility]  Dr. Edrick Kins, DPM    [address], [place] [address]                Office [phone]  Fax [phone]

## 2018-04-09 ENCOUNTER — Telehealth: Payer: Self-pay | Admitting: *Deleted

## 2018-04-09 NOTE — Telephone Encounter (Signed)
I just spoke to patient. Will you please call her and set up a surgery date. Consent forms will be signed preop at the surgery center.  Dx: Anterior tibial tendon tear left.   Thanks, Dr. Amalia Hailey

## 2018-04-09 NOTE — Telephone Encounter (Addendum)
I am calling you in regards to scheduling your surgery.  "When does he want to do it?"  He wants to do it on Thursday.  "This Thursday?"  Yes, this Thursday.  "I can't do it this Thursday.  This is a Holiday week.  I know I'll be at home but still, this is a Holiday week.  I can do any other date except for this week."  I'll try to find out if any time is available next week.  We're limited on what days surgeries can be performed, due to [place].  "I just can't do it this week."  I'll call you back and let you know the date.    I called Caren Griffins at [facility] and inquired about possible dates that Dr. Amalia Hailey can do this surgery.  She stated Tuesday and Wednesday was available next week.  I asked her to put Ms. Postel's surgery down for Wednesday at 12 N.  She said she would put a hold on that time but stated the surgery has to be approved before it can be performed.  We're going to try and schedule your surgery for Wednesday, April 15 at 12 noon at [facility].  Their address is [address].  "What are they near?  Is it near [facility]?"  No, you cross over [place].  Would you like me to mail you a brochure?  "Yes, that would be great."    I put a [facility] brochure in the mail for the patient.

## 2018-04-10 ENCOUNTER — Encounter: Payer: Self-pay | Admitting: *Deleted

## 2018-04-10 ENCOUNTER — Telehealth: Payer: Self-pay | Admitting: *Deleted

## 2018-04-10 NOTE — Progress Notes (Signed)
No Authorization needed for surgery scheduled for 04/18/2018.   Surgical - 2  Feedback  Active Coverage Family  Co-Payment - Surgical   In Network Individual  [facility] INCLUDED IN OOP  $375.00  Visit  Collect Payment Co-Insurance - Surgical In [place]  [facility] Surgery  0 %    Deductible - Health Benefit Plan Coverage  Network Not Applicable Individual  Plan / Coverage Date Jan 03, 2018 - Jan 03, 2019  Benefit Date Jan 03, 2018 - Jan 03, 2019 $0.00  Calendar Year  - $0.00  Year to Date  $0.00  Remaining

## 2018-04-10 NOTE — Telephone Encounter (Signed)
"  I'm calling to make sure I have the correct date for my surgery.  What did you tell me?"  You're going to be scheduled for Wednesday, April 18, 2018.  "Okay, that's what I thought.  At 12 pm correct?"  Yes, your surgery will start at 12 pm.

## 2018-04-16 NOTE — Patient Instructions (Signed)
Pre-Operative Instructions  Congratulations, you have decided to take an important step towards improving your quality of life.  You can be assured that the doctors and staff at [facility] will be with you every step of the way.  Here are some important things you should know:  1. Plan to be at the surgery center/hospital at least 1 (one) hour prior to your scheduled time, unless otherwise directed by the surgical center/hospital staff.  You must have a responsible adult accompany you, remain during the surgery and drive you home.  Make sure you have directions to the surgical center/hospital to ensure you arrive on time. 2. If you are having surgery at [facility] or [facility], you will need a copy of your medical history and physical form from your family physician within one month prior to the date of surgery. We will give you a form for your primary physician to complete.  3. We make every effort to accommodate the date you request for surgery.  However, there are times where surgery dates or times have to be moved.  We will contact you as soon as possible if a change in schedule is required.   4. No aspirin/ibuprofen for one week before surgery.  If you are on aspirin, any non-steroidal anti-inflammatory medications (Mobic, Aleve, Ibuprofen) should not be taken seven (7) days prior to your surgery.  You make take Tylenol for pain prior to surgery.  5. Medications - If you are taking daily heart and blood pressure medications, seizure, reflux, allergy, asthma, anxiety, pain or diabetes medications, make sure you notify the surgery center/hospital before the day of surgery so they can tell you which medications you should take or avoid the day of surgery. 6. No food or drink after midnight the night before surgery unless directed otherwise by surgical center/hospital staff. 7. No alcoholic beverages 24-hours prior to surgery.  No smoking 24-hours prior or 24-hours after  surgery. 8. Wear loose pants or shorts. They should be loose enough to fit over bandages, boots, and casts. 9. Don't wear slip-on shoes. Sneakers are preferred. 10. Bring your boot with you to the surgery center/hospital.  Also bring crutches or a walker if your physician has prescribed it for you.  If you do not have this equipment, it will be provided for you after surgery. 11. If you have not been contacted by the surgery center/hospital by the day before your surgery, call to confirm the date and time of your surgery. 12. Leave-time from work may vary depending on the type of surgery you have.  Appropriate arrangements should be made prior to surgery with your employer. 13. Prescriptions will be provided immediately following surgery by your doctor.  Fill these as soon as possible after surgery and take the medication as directed. Pain medications will not be refilled on weekends and must be approved by the doctor. 14. Remove nail polish on the operative foot and avoid getting pedicures prior to surgery. 15. Wash the night before surgery.  The night before surgery wash the foot and leg well with water and the antibacterial soap provided. Be sure to pay special attention to beneath the toenails and in between the toes.  Wash for at least three (3) minutes. Rinse thoroughly with water and dry well with a towel.  Perform this wash unless told not to do so by your physician.  Enclosed: 1 Ice pack (please put in freezer the night before surgery)   1 Hibiclens skin cleaner     Pre-op instructions  If you have any questions regarding the instructions, please do not hesitate to call our office.  [place]: [address], [place], [place] [address] -- [phone]  [place]: [address] [place], [place] [address] -- [phone]  [place]: [address]  , [place] [address] -- [phone]  [place]: [address], [address], [place], [place] [address] -- [phone]  Website:  [email or web address] 

## 2018-04-18 ENCOUNTER — Encounter: Payer: Self-pay | Admitting: Podiatry

## 2018-04-18 ENCOUNTER — Other Ambulatory Visit: Payer: Self-pay | Admitting: Podiatry

## 2018-04-18 DIAGNOSIS — M66272 Spontaneous rupture of extensor tendons, left ankle and foot: Secondary | ICD-10-CM | POA: Diagnosis not present

## 2018-04-18 MED ORDER — OXYCODONE-ACETAMINOPHEN 5-325 MG PO TABS: 1.0000 | ORAL_TABLET | Freq: Four times a day (QID) | ORAL | 0 refills | Status: DC | PRN

## 2018-04-18 MED ORDER — IBUPROFEN 800 MG PO TABS: 800.0000 mg | ORAL_TABLET | Freq: Three times a day (TID) | ORAL | 1 refills | Status: DC | PRN

## 2018-04-18 NOTE — Progress Notes (Signed)
Post op

## 2018-04-26 ENCOUNTER — Telehealth: Payer: Self-pay | Admitting: Podiatry

## 2018-04-26 NOTE — Telephone Encounter (Signed)
I called pt to confirm her appointment for tomorrow and she wanted to know if you have received the paperwork for her short term disability.

## 2018-04-27 ENCOUNTER — Other Ambulatory Visit: Payer: Self-pay

## 2018-04-27 ENCOUNTER — Ambulatory Visit (INDEPENDENT_AMBULATORY_CARE_PROVIDER_SITE_OTHER): Payer: Medicare HMO | Admitting: Podiatry

## 2018-04-27 ENCOUNTER — Encounter: Payer: Self-pay | Admitting: Podiatry

## 2018-04-27 VITALS — BP 118/62 | HR 78 | Temp 95.5°F

## 2018-04-27 DIAGNOSIS — M79676 Pain in unspecified toe(s): Secondary | ICD-10-CM

## 2018-04-27 DIAGNOSIS — Z9889 Other specified postprocedural states: Secondary | ICD-10-CM

## 2018-04-27 DIAGNOSIS — M66272 Spontaneous rupture of extensor tendons, left ankle and foot: Secondary | ICD-10-CM

## 2018-04-27 MED ORDER — AMOXICILLIN 500 MG PO CAPS: 500.0000 mg | ORAL_CAPSULE | Freq: Two times a day (BID) | ORAL | 0 refills | Status: DC

## 2018-04-27 NOTE — Addendum Note (Signed)
Addended by: Graceann Congress D on: 04/27/2018 10:25 AM   Modules accepted: Orders

## 2018-04-27 NOTE — Progress Notes (Signed)
Medication has been resent to pharmacy.  

## 2018-04-27 NOTE — Progress Notes (Signed)
   Subjective:  Patient presents today status post repair anterior tibial tendon left. DOS: 04/18/2018.  Patient states that the pain is tolerable.  She has been nonweightbearing to the surgical extremity using a knee scooter.  No new complaints at this time  Past Medical History:  Diagnosis Date  . Arthritis   . Cancer ([place]) 08/2012   skin right anterior lower leg, squamous cell  . Gout   . Hx of seasonal allergies   . Hyperlipidemia   . Hypertension   . 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 complications       Objective/Physical Exam Neurovascular status intact.  Skin incisions appear to be well coapted with sutures and staples intact. No sign of infectious process noted. No dehiscence. No active bleeding noted. Moderate edema noted to the surgical extremity.  Assessment: 1. s/p repair anterior tibial tendon left. DOS: 04/18/2018   Plan of Care:  1. Patient was evaluated. 2.  Dressings changed today.  Keep clean dry and intact for 1 week 3.  Continue nonweightbearing using the knee scooter 4.  Continue immobilization cam boot 5.  Return to clinic in 1 week   Edrick Kins, DPM [facility]  Dr. Edrick Kins, [address]                                        [place], [place] [address]                Office [phone]  Fax [phone]

## 2018-05-01 ENCOUNTER — Encounter: Payer: Medicare HMO | Admitting: Podiatry

## 2018-05-04 ENCOUNTER — Encounter: Payer: Self-pay | Admitting: Podiatry

## 2018-05-04 ENCOUNTER — Ambulatory Visit (INDEPENDENT_AMBULATORY_CARE_PROVIDER_SITE_OTHER): Payer: Medicare HMO | Admitting: Podiatry

## 2018-05-04 ENCOUNTER — Other Ambulatory Visit: Payer: Self-pay

## 2018-05-04 VITALS — Temp 97.2°F

## 2018-05-04 DIAGNOSIS — M66272 Spontaneous rupture of extensor tendons, left ankle and foot: Secondary | ICD-10-CM | POA: Diagnosis not present

## 2018-05-04 DIAGNOSIS — R600 Localized edema: Secondary | ICD-10-CM

## 2018-05-04 DIAGNOSIS — Z9889 Other specified postprocedural states: Secondary | ICD-10-CM

## 2018-05-04 NOTE — Progress Notes (Signed)
   Subjective:  Patient presents today status post repair anterior tibial tendon left. DOS: 04/18/2018.  Patient denies any change at this time with no new complaints  Past Medical History:  Diagnosis Date  . Arthritis   . Cancer ([place]) 08/2012   skin right anterior lower leg, squamous cell  . Gout   . Hx of seasonal allergies   . Hyperlipidemia   . Hypertension   . 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 complications       Objective/Physical Exam Neurovascular status intact.  Skin incisions appear to be well coapted with sutures and staples intact. No sign of infectious process noted. No dehiscence. No active bleeding noted. Moderate edema noted to the surgical extremity.  Assessment: 1. s/p repair anterior tibial tendon left. DOS: 04/18/2018 2.  Moderate edema left lower extremity   Plan of Care:  1. Patient was evaluated. 2.  Skin staples removed today 3.  Multilayer Unna boot soft cast applied to the surgical extremity to help with edema and swelling 4.  Continue nonweightbearing in the immobilization cam boot 5.  Return to clinic in 1 week for possible partial weightbearing with the assistance of a walker  Edrick Kins, DPM [facility]  Dr. Edrick Kins, [place]                                        [place], [place] [address]                Office [phone]  Fax [phone]

## 2018-05-08 ENCOUNTER — Encounter: Payer: Medicare HMO | Admitting: Podiatry

## 2018-05-11 ENCOUNTER — Ambulatory Visit (INDEPENDENT_AMBULATORY_CARE_PROVIDER_SITE_OTHER): Payer: Self-pay | Admitting: Podiatry

## 2018-05-11 ENCOUNTER — Other Ambulatory Visit: Payer: Self-pay

## 2018-05-11 VITALS — Temp 97.2°F

## 2018-05-11 DIAGNOSIS — M66272 Spontaneous rupture of extensor tendons, left ankle and foot: Secondary | ICD-10-CM

## 2018-05-11 DIAGNOSIS — Z9889 Other specified postprocedural states: Secondary | ICD-10-CM

## 2018-05-29 NOTE — Progress Notes (Signed)
   Subjective:  Patient presents today status post repair anterior tibial tendon left. DOS: 04/18/2018.     Past Medical History:  Diagnosis Date  . Arthritis   . Cancer ([place]) 08/2012   skin right anterior lower leg, squamous cell  . Gout   . Hx of seasonal allergies   . Hyperlipidemia   . Hypertension   . 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 complications       Objective/Physical Exam Neurovascular status intact.  Skin incisions appear to be well coapted. No sign of infectious process noted. No dehiscence. No active bleeding noted. Moderate edema noted to the surgical extremity.  Assessment: 1. s/p repair anterior tibial tendon left. DOS: 04/18/2018 2.  Moderate edema left lower extremity   Plan of Care:  1. Patient was evaluated. 2.  Patient may begin partial weightbearing in the cam boot using a walker.  Discontinue the knee scooter 3.  Continue wearing the night splint 4.  Resume compression socks to help alleviate edema 5.  Return to clinic in 3 weeks  Edrick Kins, DPM [facility]  Dr. Edrick Kins, [address]                                        [place], [place] [address]                Office [phone]  Fax [phone]

## 2018-05-30 ENCOUNTER — Encounter: Payer: Self-pay | Admitting: Podiatry

## 2018-06-01 ENCOUNTER — Encounter: Payer: Medicare HMO | Admitting: Podiatry

## 2018-06-01 ENCOUNTER — Encounter: Payer: Self-pay | Admitting: Podiatry

## 2018-06-01 ENCOUNTER — Other Ambulatory Visit: Payer: Self-pay

## 2018-06-01 ENCOUNTER — Other Ambulatory Visit: Payer: Medicare HMO

## 2018-06-01 ENCOUNTER — Ambulatory Visit (INDEPENDENT_AMBULATORY_CARE_PROVIDER_SITE_OTHER): Payer: Medicare HMO | Admitting: Podiatry

## 2018-06-01 VITALS — Temp 96.8°F

## 2018-06-01 DIAGNOSIS — Z9889 Other specified postprocedural states: Secondary | ICD-10-CM

## 2018-06-01 DIAGNOSIS — M66272 Spontaneous rupture of extensor tendons, left ankle and foot: Secondary | ICD-10-CM

## 2018-06-04 NOTE — Progress Notes (Signed)
   Subjective:  Patient presents today status post repair anterior tibial tendon left. DOS: 04/18/2018. She states she is improving well. She denies any significant pain or modifying factors. She has been using the CAM boot as directed. Patient is here for further evaluation and treatment.   Past Medical History:  Diagnosis Date  . Arthritis   . Cancer ([place]) 08/2012   skin right anterior lower leg, squamous cell  . Gout   . Hx of seasonal allergies   . Hyperlipidemia   . Hypertension   . 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 complications       Objective/Physical Exam Neurovascular status intact.  Skin incisions appear to be well coapted. No sign of infectious process noted. No dehiscence. No active bleeding noted. Moderate edema noted to the surgical extremity.  Assessment: 1. s/p repair anterior tibial tendon left. DOS: 04/18/2018  Plan of Care:  1. Patient was evaluated. 2. Discontinue using CAM boot. Transition into good sneakers.  3. Orders for physical therapy placed today.  4. Return to work on 06/13/2018.  5. Return to clinic in 6 weeks.   Edrick Kins, DPM [facility]  Dr. Edrick Kins, [place]                                        [place], [place] [address]                Office [phone]  Fax [phone]

## 2018-06-05 ENCOUNTER — Other Ambulatory Visit: Payer: Self-pay

## 2018-06-05 ENCOUNTER — Encounter: Payer: Self-pay | Admitting: Physical Therapy

## 2018-06-05 ENCOUNTER — Ambulatory Visit: Payer: Medicare HMO | Attending: Podiatry

## 2018-06-05 DIAGNOSIS — R2689 Other abnormalities of gait and mobility: Secondary | ICD-10-CM | POA: Insufficient documentation

## 2018-06-05 DIAGNOSIS — M25572 Pain in left ankle and joints of left foot: Secondary | ICD-10-CM | POA: Diagnosis present

## 2018-06-05 DIAGNOSIS — M6281 Muscle weakness (generalized): Secondary | ICD-10-CM | POA: Diagnosis present

## 2018-06-05 NOTE — Patient Instructions (Signed)
Access Code: [email or web address]  URL: [email or web address]  Date: 06/05/2018  Prepared by: Lieutenant Diego   Exercises Seated Ankle Inversion Eversion AROM - 10 reps - 3 sets - 1x daily - 7x weekly Seated Ankle Pumps - 10 reps - 3 sets - 1x daily - 7x weekly Long Sitting Ankle Eversion with Resistance - 10 reps - 3 sets - 1x daily - 7x weekly Seated Ankle Inversion with Resistance and Legs Crossed - 15 reps - 1 sets - 1x daily - 7x weekly Ankle and Toe Plantarflexion with Resistance - 15 reps - 1 sets - 1x daily - 7x weekly Ankle Dorsiflexion with Resistance - 15 reps - 1 sets - 1x daily - 7x weekly Seated Ankle Alphabet - 15 reps - 1 sets - 1x daily - 7x weekly

## 2018-06-05 NOTE — Therapy (Signed)
[place] [facility] [address]. [place], [place], [address] Phone: [phone]   Fax:  [phone]  Physical Therapy Evaluation  Patient Details  Name: Sheryl Kennedy MRN: [id number] Date of Birth: [birth year] Referring Provider (PT): Daylene Katayama   Encounter Date: 06/05/2018  PT End of Session - 06/05/18 1359    Visit Number  1    Number of Visits  12    Date for PT Re-Evaluation  07/17/18    Authorization Type  aetna medicare    Authorization - Visit Number  1    Authorization - Number of Visits  10    PT Start Time  1401    PT Stop Time  1457    PT Time Calculation (min)  56 min    Activity Tolerance  Patient tolerated treatment well    Behavior During Therapy  [facility] for tasks assessed/performed       Past Medical History:  Diagnosis Date  . Arthritis   . Cancer ([place]) 08/2012   skin right anterior lower leg, squamous cell  . Gout   . Hx of seasonal allergies   . Hyperlipidemia   . Hypertension   . 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 complications     Past Surgical History:  Procedure Laterality Date  . BACK SURGERY  2008   no metal in back  . BREAST BIOPSY Right 09/13/2010   core/neg  . BREAST BIOPSY Right 12/09/2015   US biopsy/neg  . BREAST SURGERY Right 2012  . CHONDROPLASTY Left 01/22/2016   Procedure: arthroscopic medial AND LATERAL CHONDROPLASTY;  Surgeon: Dereck Leep, MD;  Location: [facility] ORS;  Service: Orthopedics;  Laterality: Left;  . COLONOSCOPY  2011  . COLONOSCOPY WITH PROPOFOL N/A 04/14/2015   Procedure: COLONOSCOPY WITH PROPOFOL;  Surgeon: Lollie Sails, MD;  Location: [facility] ENDOSCOPY;  Service: Endoscopy;  Laterality: N/A;  . CYST REMOVAL HAND Left 2012  . JOINT REPLACEMENT Right 09/30/2013   TOTAL KNEE REPLACEMENT, DR. Marry Guan, [place]  . KNEE ARTHROSCOPY WITH MEDIAL MENISECTOMY Left 01/22/2016   Procedure: KNEE ARTHROSCOPY WITH MEDIAL AND LATERAL MENISECTOMY;  Surgeon: Dereck Leep, MD;   Location: [facility] ORS;  Service: Orthopedics;  Laterality: Left;  . KNEE SURGERY  1997  . Stab pheblectomy  Left 2013  . TONSILLECTOMY    . VEIN CLOSURE Bilateral 2012    There were no vitals filed for this visit.   Subjective Assessment - 06/05/18 1414    Subjective  Pt reported her pain is minor, her main complaint is walking    Pertinent History  Pt with PMH of HTN, HLD, gout, skin cancer of R anterior lower leg, B TKR, back surgery without metal in back, CKD III. S/p anterior tibial tendon repair L ankle 04/18/2018. Physician given clearance to wean out of boot. Pt ambulated into clinic without boot and reported she is not wearing it. Planning to return to work next week.      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  NA    How long can you stand comfortably?  2-3 mins    How long can you walk comfortably?  2-73mins (difficulty with fatigue)    Patient Stated Goals  to walk normally, stair navigation, return to work    Currently in Pain?  No/denies    Pain Score  --   best: 0, worst: 5  Pain Location  Ankle    Pain Orientation  Left    Pain Descriptors / Indicators  Sharp;Sore;Tightness    Pain Type  Chronic pain    Pain Onset  More than a month ago    Pain Frequency  Intermittent    Aggravating Factors   walking/standing,     Pain Relieving Factors  rest, meds, ice    Effect of Pain on Daily Activities  impedes daily activities        [facility] PT Assessment - 06/05/18 0001      Assessment   Referring Provider (PT)  Daylene Katayama    Onset Date/Surgical Date  04/18/18    Prior Therapy  no      Precautions   Precautions  None      Restrictions   Weight Bearing Restrictions  No      Balance Screen   Has the patient fallen in the past 6 months  No    Has the patient had a decrease in activity level because of a fear of falling?   No    Is the patient reluctant to  leave their home because of a fear of falling?   No      Home Social worker  Private residence    Living Arrangements  Spouse/significant other    Available Help at Discharge  Family    Type of [place] to enter;Ramped entrance    Entrance Stairs-Number of Steps  3    Entrance Stairs-Rails  None    Home Layout  Two level    Alternate Level Stairs-Number of Steps  [place] - 2 wheels;Cane - quad;Cane - single point      Prior Function   Level of Independence  Independent    Vocation  Part time employment    Vocation Requirements  sedentary      Cognition   Overall Cognitive Status  Within Functional Limits for tasks assessed      Observation/Other Assessments-Edema    Edema  Figure 8      Figure 8 Edema   Figure 8 - Right   21 in    Figure 8 - Left   22.5 in      ROM / Strength   AROM / PROM / Strength  AROM;Strength      AROM   Overall AROM   Deficits    AROM Assessment Site  Ankle    Right/Left Ankle  Right;Left    Right Ankle Dorsiflexion  0    Right Ankle Plantar Flexion  20   from resting position   Right Ankle Inversion  20    Right Ankle Eversion  25    Left Ankle Dorsiflexion  -2    Left Ankle Plantar Flexion  5   from resting position   Left Ankle Inversion  5    Left Ankle Eversion  5      Strength   Overall Strength  Deficits    Strength Assessment Site  Hip;Ankle;Knee    Right/Left Hip  Right;Left    Right Hip Flexion  4-/5    Right Hip ABduction  4/5   seated   Right Hip ADduction  4/5   seated   Left Hip Flexion  4+/5    Left Hip ABduction  4+/5    Left Hip ADduction  4+/5  Right/Left Knee  Right;Left    Right Knee Flexion  5/5    Right Knee Extension  4/5    Left Knee Flexion  5/5    Left Knee Extension  4/5    Right/Left Ankle  Right;Left    Right Ankle Dorsiflexion  5/5    Right Ankle Plantar Flexion  5/5    Right Ankle Inversion   5/5    Right Ankle Eversion  5/5    Left Ankle Dorsiflexion  4/5    Left Ankle Plantar Flexion  4/5    Left Ankle Inversion  3-/5    Left Ankle Eversion  3-/5      Transfers   Five time sit to stand comments   12 secs      Balance   Balance Assessed  Yes      Static Standing Balance   Static Standing - Balance Support  No upper extremity supported    Static Standing - Level of Assistance  5: Stand by assistance    Static Standing Balance -  Activities   Tandam Stance - Left Leg;Sharpened Romberg - Eyes Open   one step forward: mod sway, tandem LOB     gait: Pt with significant vaulting noted during ambulation, as well as circumduction with limb intermittently. Decreased L heel strike and toe off as well and decreased stride bilaterally.    TREATMENT:  Seated theraband resistive ankle strengthening all directions with RTB x15 ea  For L ankle Seated alphabet of L ankle (capital letters) Ambulation in parallel bars with BUE support x4 round with emphasis on heel strike pregait activities with B UE (standing R foot forward, anterior/posterior weight shifts with focus on knee flexion and toe off in preswing on LLE.) Mild carryover with ambulation, pt with difficulty with heel strike and toe off on L. Vaulting noted during ambulation   Clinical impression:  The patient is 73 yo female s/p L anterior tibial tendon repair 04/18/2018. Per physician note, pt to wean out of CAM boot and return to work in several weeks. Pt presented with significant deviations in ambulation, decreased LE strength, endurance, and balance. Exhibited vaulting during ambulation, as well as circumduction with limb intermittently. Decreased L heel strike and toe off as well and decreased stride bilaterally.  Pt would benefit from assessment of limb leg discrepancy next session and potential benefit of shoe lift. The patient would benefit from further skilled PT to address limitations, improve functional abilities  and decrease risk of falls.   PT Short Term Goals - 06/05/18 1533      PT SHORT TERM GOAL #1   Title  Pt will be compliant with initial HEP daily in preparation for self management of condition.    Baseline  initial HEP administered 06/05/2018    Time  3    Period  Weeks    Status  New    Target Date  06/26/18        PT Long Term Goals - 06/05/18 1526      PT LONG TERM GOAL #1   Title  Pt will be compliant with HEP for self management of condition to improve functional activities.     Baseline  initial HEP administered    Time  6    Period  Weeks    Status  New    Target Date  07/17/18      PT LONG TERM GOAL #2   Title  Pt will demonstrate LE strength MMT grade of  5/5 bilaterally to improve ability to perform functoinal activities    Baseline  see flowsheet for details 06/05/2018    Time  6    Period  Weeks    Status  New    Target Date  07/17/18      PT LONG TERM GOAL #3   Title  Pt will demonstrate full ROM of L ankle to indicate return to PLOF and decrease risk of falls.    Baseline  see flowsheet for details 06/05/2018    Time  6    Period  Weeks    Status  New    Target Date  07/17/18      PT LONG TERM GOAL #4   Title  Pt will demonstrate gait WFLs and ability to ambulate >62minutes to improve functional activities and decrease risk of falls.    Baseline  significant vaulting noted during ambulation, as well as circumduction with limb intermittently. Decreased L heel strike and toe off as well and decreased stride bilaterally 06/05/2018    Time  6    Period  Weeks    Status  New    Target Date  07/17/18      PT LONG TERM GOAL #5   Title  Patient's L ankle circumfrence via figure 8 method will be within .5 of an inch of R ankle to demonstrate decreased edema.     Baseline  21 in R ankle, 22.5 inch L ankle    Time  6    Period  Weeks    Status  New    Target Date  07/17/18             Plan - 06/05/18 1514    Clinical Impression Statement  The patient is 73  yo female s/p L anterior tibial tendon repair 04/18/2018. Per physician note, pt to wean out of CAM boot and return to work in several weeks. Pt presented with significant deviations in ambulation, decreased LE strength, endurance, and balance. Exhibited vaulting during ambulation, as well as circumduction with limb intermittently. Decreased L heel strike and toe off as well and decreased stride bilaterally.  Pt would benefit from assessment of limb leg discrepancy next session and potential benefit of shoe lift. The patient would benefit from further skilled PT to address limitations, improve functional abilities and decrease risk of falls.     Personal Factors and Comorbidities  Age;Comorbidity 3+    Comorbidities   HTN, HLD, gout, skin cancer of R anterior lower leg, B TKR, back surgery without metal in back, CKD III.    Examination-Activity Limitations  Stairs;Stand;Locomotion Level    Examination-Participation Restrictions  Yard Work;Cleaning;Community Activity    Stability/Clinical Decision Making  Stable/Uncomplicated    Clinical Decision Making  Low    Rehab Potential  Good    PT Frequency  2x / week    PT Duration  6 weeks    PT Treatment/Interventions  ADLs/Self Care Home Management;Gait training;Cryotherapy;Therapeutic exercise;Patient/family education;Splinting;Taping;Scar mobilization;Manual techniques;Balance training;Stair training;Electrical Stimulation;Aquatic Therapy;Moist Heat;DME Instruction;Therapeutic activities;Functional mobility training;Neuromuscular re-education;Passive range of motion;Dry needling;Vestibular;Joint Manipulations    PT Next Visit Plan  assess leg length descrepancy, balance training, LE strengthening    PT Home Exercise Plan  [email or web address] medbridge access code (ankle theraband strengthening, ankle alphabet)    Consulted and Agree with Plan of Care  Patient       Patient will benefit from skilled therapeutic intervention in order to improve the following deficits  and impairments:  Abnormal gait, Decreased balance,  Decreased endurance, Decreased mobility, Difficulty walking, Hypomobility, Improper body mechanics, Decreased range of motion, Decreased activity tolerance, Decreased strength, Pain  Visit Diagnosis: Muscle weakness (generalized)  Other abnormalities of gait and mobility  Pain in left ankle and joints of left foot     Problem List Patient Active Problem List   Diagnosis Date Noted  . Mid sternal chest pain 06/06/2017  . SOB (shortness of breath) 06/06/2017  . Presence of left artificial knee joint 05/13/2016  . Primary osteoarthritis of left knee 05/05/2016  . Status post total right knee replacement 09/21/2014  . Gout 07/16/2013  . Hyperlipidemia, unspecified 07/16/2013  . Inflammatory arthritis 07/16/2013  . CKD (chronic kidney disease), stage III ([place]) 06/24/2013  . Benign essential hypertension 05/02/2013  . 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 complications 03/17/9456  . Breast screening, unspecified 09/17/2012  . Leg ulcer ([place]) 09/17/2012    Lieutenant Diego PT, DPT 3:33 PM,06/05/18 [phone]  [facility] [address] [place], [place], [address] Phone: [phone]   Fax:  [phone]  Name: Sheryl Kennedy MRN: [id number] Date of Birth: 1945/02/02

## 2018-06-07 ENCOUNTER — Ambulatory Visit: Payer: Medicare HMO | Admitting: Physical Therapy

## 2018-06-08 ENCOUNTER — Encounter: Payer: Self-pay | Admitting: Podiatry

## 2018-06-08 ENCOUNTER — Ambulatory Visit: Payer: Medicare HMO | Admitting: Physical Therapy

## 2018-06-08 ENCOUNTER — Telehealth: Payer: Self-pay | Admitting: Podiatry

## 2018-06-08 NOTE — Telephone Encounter (Signed)
Patient would like a note stating that she can return to work with restrictions faxed to [phone], please call patient for details.

## 2018-06-12 ENCOUNTER — Encounter: Payer: Self-pay | Admitting: Physical Therapy

## 2018-06-12 ENCOUNTER — Ambulatory Visit: Payer: Medicare HMO | Admitting: Physical Therapy

## 2018-06-12 ENCOUNTER — Other Ambulatory Visit: Payer: Self-pay

## 2018-06-12 ENCOUNTER — Encounter: Payer: Self-pay | Admitting: Podiatry

## 2018-06-12 DIAGNOSIS — M6281 Muscle weakness (generalized): Secondary | ICD-10-CM

## 2018-06-12 DIAGNOSIS — M25572 Pain in left ankle and joints of left foot: Secondary | ICD-10-CM

## 2018-06-12 DIAGNOSIS — R2689 Other abnormalities of gait and mobility: Secondary | ICD-10-CM

## 2018-06-12 NOTE — Therapy (Signed)
[facility] [address] [place], [place], [address] Phone: [phone]   Fax:  [phone]  Physical Therapy Treatment  Patient Details  Name: Sheryl Kennedy MRN: [id number] Date of Birth: 09/29/1945 Referring Provider (PT): Daylene Katayama   Encounter Date: 06/12/2018  PT End of Session - 06/12/18 1411    Visit Number  2    Number of Visits  12    Date for PT Re-Evaluation  07/17/18    Authorization Type  aetna medicare    Authorization - Visit Number  2    Authorization - Number of Visits  10    PT Start Time  1601    PT Stop Time  1649    PT Time Calculation (min)  48 min    Activity Tolerance  Patient tolerated treatment well    Behavior During Therapy  [facility] for tasks assessed/performed       Past Medical History:  Diagnosis Date  . Arthritis   . Cancer ([place]) 08/2012   skin right anterior lower leg, squamous cell  . Gout   . Hx of seasonal allergies   . Hyperlipidemia   . Hypertension   . 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 complications     Past Surgical History:  Procedure Laterality Date  . BACK SURGERY  2008   no metal in back  . BREAST BIOPSY Right 09/13/2010   core/neg  . BREAST BIOPSY Right 12/09/2015   US biopsy/neg  . BREAST SURGERY Right 2012  . CHONDROPLASTY Left 01/22/2016   Procedure: arthroscopic medial AND LATERAL CHONDROPLASTY;  Surgeon: Dereck Leep, MD;  Location: [facility] ORS;  Service: Orthopedics;  Laterality: Left;  . COLONOSCOPY  2011  . COLONOSCOPY WITH PROPOFOL N/A 04/14/2015   Procedure: COLONOSCOPY WITH PROPOFOL;  Surgeon: Lollie Sails, MD;  Location: [facility] ENDOSCOPY;  Service: Endoscopy;  Laterality: N/A;  . CYST REMOVAL HAND Left 2012  . JOINT REPLACEMENT Right 09/30/2013   TOTAL KNEE REPLACEMENT, DR. Marry Guan, [place]  . KNEE ARTHROSCOPY WITH MEDIAL MENISECTOMY Left 01/22/2016   Procedure: KNEE ARTHROSCOPY WITH MEDIAL AND LATERAL MENISECTOMY;  Surgeon: Dereck Leep, MD;   Location: [facility] ORS;  Service: Orthopedics;  Laterality: Left;  . KNEE SURGERY  1997  . Stab pheblectomy  Left 2013  . TONSILLECTOMY    . VEIN CLOSURE Bilateral 2012    There were no vitals filed for this visit.  Subjective Assessment - 06/12/18 1408    Subjective  Pt. states she has a few questions about HEP and reports no pain at this time.  Pt. entered PT with moderate antalgic gait pattern and lateral swaying.  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      Pertinent History  Pt with PMH of HTN, HLD, gout, skin cancer of R anterior lower leg, B TKR, back surgery without metal in back, CKD III. S/p anterior tibial tendon repair L ankle 04/18/2018. Physician given clearance to wean out of boot. Pt ambulated into clinic without boot and reported she is not wearing it. Planning to return to work next week.      Limitations  Walking;Standing;House hold activities;Other (comment)    How long can you sit comfortably?  NA    How long can you stand comfortably?  2-3 mins    How long can you walk comfortably?  2-45mins (difficulty with fatigue)    Patient Stated Goals  to walk normally, stair navigation, return to work    Currently in Pain?  No/denies         There.ex.:  Nustep L5 10 min. B LE (maintain L heel position on foot plate) Reviewed HEP/ RTB ex. Supine L ankle isometrics (manual)- 10x each Step ups with L/R on 6" step with B UE assist (cuing to prevent lateral leaning)- 10x each Sidestepping in //-bars L/R 4x (mirror feedback) Tandem stance (<12 sec.)  Manual tx.:  Supine L ankle/ LE stretches (all planes of movement)- discussed use of compression stocking  Gait training:  Ambulate in //-bars forward/ backward working on consistent heel strike/ toe off (upright posture and //-bars assist) Amb. With [facility] on R with 2-point gait pattern working on heel strike/ arm swing and posture (preventing lateral leaning).    No LLD      PT Short Term Goals - 06/05/18 1533      PT SHORT TERM GOAL #1   Title  Pt will be compliant with initial HEP daily in preparation for self management of condition.    Baseline  initial HEP administered 06/05/2018    Time  3    Period  Weeks    Status  New    Target Date  06/26/18        PT Long Term Goals - 06/05/18 1526      PT LONG TERM GOAL #1   Title  Pt will be compliant with HEP for self management of condition to improve functional activities.     Baseline  initial HEP administered    Time  6    Period  Weeks    Status  New    Target Date  07/17/18      PT LONG TERM GOAL #2   Title  Pt will demonstrate LE strength MMT grade of 5/5 bilaterally to improve ability to perform functoinal activities    Baseline  see flowsheet for details 06/05/2018    Time  6    Period  Weeks    Status  New    Target Date  07/17/18      PT LONG TERM GOAL #3   Title  Pt will demonstrate full ROM of L ankle to indicate return to PLOF and decrease risk of falls.    Baseline  see flowsheet for details 06/05/2018    Time  6    Period  Weeks    Status  New    Target Date  07/17/18      PT LONG TERM GOAL #4   Title  Pt will demonstrate gait WFLs and ability to ambulate >67minutes to improve functional activities and decrease risk of falls.    Baseline  significant vaulting noted during ambulation, as well as circumduction with limb intermittently. Decreased L heel strike and toe off as well and decreased stride bilaterally 06/05/2018    Time  6    Period  Weeks    Status  New    Target Date  07/17/18      PT LONG TERM GOAL #5   Title  Patient's L ankle circumfrence via figure 8 method will be within .5 of an inch of R ankle to demonstrate decreased edema.     Baseline  21 in R ankle, 22.5 inch L ankle    Time  6    Period  Weeks    Status  New    Target Date  07/17/18  Plan - 06/12/18 1607    Clinical Impression Statement  Pt. ambulates into PT clinic with moderate L/R  lateral lean during step through phase of gait.  Pt. able to correct lateral lean with use SPC on R side with 2-point gait pattern.  No LLD.  Pt. benefits from mirror feedback with standing ther.ex. to correct upright posture/ prevent lateral leaning during hip flexion.  No c/o L lower leg pain during tx. session.  Good technique with current HEP (added sidestepping).      Personal Factors and Comorbidities  Age;Comorbidity 3+    Comorbidities   HTN, HLD, gout, skin cancer of R anterior lower leg, B TKR, back surgery without metal in back, CKD III.    Examination-Activity Limitations  Stairs;Stand;Locomotion Level    Examination-Participation Restrictions  Yard Work;Cleaning;Community Activity    Stability/Clinical Decision Making  Stable/Uncomplicated    Clinical Decision Making  Low    Rehab Potential  Good    PT Frequency  2x / week    PT Duration  6 weeks    PT Treatment/Interventions  ADLs/Self Care Home Management;Gait training;Cryotherapy;Therapeutic exercise;Patient/family education;Splinting;Taping;Scar mobilization;Manual techniques;Balance training;Stair training;Electrical Stimulation;Aquatic Therapy;Moist Heat;DME Instruction;Therapeutic activities;Functional mobility training;Neuromuscular re-education;Passive range of motion;Dry needling;Vestibular;Joint Manipulations    PT Next Visit Plan  Balance training, LE strengthening.  SPC use    PT Home Exercise Plan  [email or web address] medbridge access code (ankle theraband strengthening, ankle alphabet)       Patient will benefit from skilled therapeutic intervention in order to improve the following deficits and impairments:  Abnormal gait, Decreased balance, Decreased endurance, Decreased mobility, Difficulty walking, Hypomobility, Improper body mechanics, Decreased range of motion, Decreased activity tolerance, Decreased strength, Pain  Visit Diagnosis: Muscle weakness (generalized)  Other abnormalities of gait and mobility  Pain in left  ankle and joints of left foot     Problem List Patient Active Problem List   Diagnosis Date Noted  . Mid sternal chest pain 06/06/2017  . SOB (shortness of breath) 06/06/2017  . Presence of left artificial knee joint 05/13/2016  . Primary osteoarthritis of left knee 05/05/2016  . Status post total right knee replacement 09/21/2014  . Gout 07/16/2013  . Hyperlipidemia, unspecified 07/16/2013  . Inflammatory arthritis 07/16/2013  . CKD (chronic kidney disease), stage III ([place]) 06/24/2013  . Benign essential hypertension 05/02/2013  . 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 complications 69/79/4801  . Breast screening, unspecified 09/17/2012  . Leg ulcer ([place]) 09/17/2012   Pura Spice, PT, DPT # [phone] 06/12/2018, 4:12 PM  [place] [facility] [address] [place], [place], [address] Phone: [phone]   Fax:  [phone]  Name: DANETTE WEINFELD MRN: [id number] Date of Birth: 09-13-1945

## 2018-06-14 ENCOUNTER — Encounter: Payer: Self-pay | Admitting: Physical Therapy

## 2018-06-14 ENCOUNTER — Ambulatory Visit: Payer: Medicare HMO | Admitting: Physical Therapy

## 2018-06-14 ENCOUNTER — Other Ambulatory Visit: Payer: Self-pay

## 2018-06-14 DIAGNOSIS — M25572 Pain in left ankle and joints of left foot: Secondary | ICD-10-CM

## 2018-06-14 DIAGNOSIS — R2689 Other abnormalities of gait and mobility: Secondary | ICD-10-CM

## 2018-06-14 DIAGNOSIS — M6281 Muscle weakness (generalized): Secondary | ICD-10-CM

## 2018-06-14 NOTE — Therapy (Addendum)
[facility] [address] [place], [place], [address] Phone: [phone]   Fax:  [phone]  Physical Therapy Treatment  Patient Details  Name: Sheryl Kennedy MRN: [id number] Date of Birth: 1945-09-06 Referring Provider (PT): Daylene Katayama   Encounter Date: 06/14/2018  Treatment: 3 of 10.  Recert date: 9/62/2297 1358 to 29   Past Medical History:  Diagnosis Date  . Arthritis   . Cancer ([place]) 08/2012   skin right anterior lower leg, squamous cell  . Gout   . Hx of seasonal allergies   . Hyperlipidemia   . Hypertension   . 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 complications     Past Surgical History:  Procedure Laterality Date  . BACK SURGERY  2008   no metal in back  . BREAST BIOPSY Right 09/13/2010   core/neg  . BREAST BIOPSY Right 12/09/2015   US biopsy/neg  . BREAST SURGERY Right 2012  . CHONDROPLASTY Left 01/22/2016   Procedure: arthroscopic medial AND LATERAL CHONDROPLASTY;  Surgeon: Dereck Leep, MD;  Location: [facility] ORS;  Service: Orthopedics;  Laterality: Left;  . COLONOSCOPY  2011  . COLONOSCOPY WITH PROPOFOL N/A 04/14/2015   Procedure: COLONOSCOPY WITH PROPOFOL;  Surgeon: Lollie Sails, MD;  Location: [facility] ENDOSCOPY;  Service: Endoscopy;  Laterality: N/A;  . CYST REMOVAL HAND Left 2012  . JOINT REPLACEMENT Right 09/30/2013   TOTAL KNEE REPLACEMENT, DR. Marry Guan, [place]  . KNEE ARTHROSCOPY WITH MEDIAL MENISECTOMY Left 01/22/2016   Procedure: KNEE ARTHROSCOPY WITH MEDIAL AND LATERAL MENISECTOMY;  Surgeon: Dereck Leep, MD;  Location: [facility] ORS;  Service: Orthopedics;  Laterality: Left;  . KNEE SURGERY  1997  . Stab pheblectomy  Left 2013  . TONSILLECTOMY    . VEIN CLOSURE Bilateral 2012    There were no vitals filed for this visit.    Pt. returned to work today and is going to retire tomorrow. Pt. is excited about upcoming retirement. Pt. reports no L foot pain at this  time.      There.ex.:  Nustep L6 10 min. B LE (maintain L heel position on foot plate) Supine L ankle isometrics (manual)- 10x each Supine knee to chest with green ball 20x/ bridging 10x2.   Resisted gait 5x all 4-planes with single BTB (mirror feedback/ focus to prevent lateral leaning). Tandem stance/ gait  Manual tx.:  Supine L ankle/ LE stretches (all planes of movement)- focus on DF  Gait training:  Ambulate in //-bars forward/ backward working on consistent heel strike/ toe off (upright posture and //-bars assist) Amb. With [facility] on R with 2-point gait pattern working on heel strike/ arm swing and posture (preventing lateral leaning).   Recip. Stair climbing with 1 handrail/ SPC use        PT Short Term Goals - 06/05/18 1533      PT SHORT TERM GOAL #1   Title  Pt will be compliant with initial HEP daily in preparation for self management of condition.    Baseline  initial HEP administered 06/05/2018    Time  3    Period  Weeks    Status  New    Target Date  06/26/18        PT Long Term Goals - 06/05/18 1526      PT LONG TERM GOAL #1   Title  Pt will be compliant with HEP for self management of condition to improve functional activities.     Baseline  initial  HEP administered    Time  6    Period  Weeks    Status  New    Target Date  07/17/18      PT LONG TERM GOAL #2   Title  Pt will demonstrate LE strength MMT grade of 5/5 bilaterally to improve ability to perform functoinal activities    Baseline  see flowsheet for details 06/05/2018    Time  6    Period  Weeks    Status  New    Target Date  07/17/18      PT LONG TERM GOAL #3   Title  Pt will demonstrate full ROM of L ankle to indicate return to PLOF and decrease risk of falls.    Baseline  see flowsheet for details 06/05/2018    Time  6    Period  Weeks    Status  New    Target Date  07/17/18      PT LONG TERM GOAL #4   Title  Pt will demonstrate gait WFLs and ability to ambulate >55minutes  to improve functional activities and decrease risk of falls.    Baseline  significant vaulting noted during ambulation, as well as circumduction with limb intermittently. Decreased L heel strike and toe off as well and decreased stride bilaterally 06/05/2018    Time  6    Period  Weeks    Status  New    Target Date  07/17/18      PT LONG TERM GOAL #5   Title  Patient's L ankle circumfrence via figure 8 method will be within .5 of an inch of R ankle to demonstrate decreased edema.     Baseline  21 in R ankle, 22.5 inch L ankle    Time  6    Period  Weeks    Status  New    Target Date  07/17/18       Pt. ambulates into PT clinic with moderate L/R lateral lean during step through phase of gait and use of SPC today.  Moderate verbal cuing/ mirror feedback during resisted ther.ex./ SPC use.  No c/o L lower leg pain during tx. session.  Focus on step ups/ downs with light UE assist and recip. pattern.       Patient will benefit from skilled therapeutic intervention in order to improve the following deficits and impairments:  Abnormal gait, Decreased balance, Decreased endurance, Decreased mobility, Difficulty walking, Hypomobility, Improper body mechanics, Decreased range of motion, Decreased activity tolerance, Decreased strength, Pain  Visit Diagnosis: Muscle weakness (generalized)   Other abnormalities of gait and mobility   Pain in left ankle and joints of left foot     Problem List Patient Active Problem List   Diagnosis Date Noted  . Mid sternal chest pain 06/06/2017  . SOB (shortness of breath) 06/06/2017  . Presence of left artificial knee joint 05/13/2016  . Primary osteoarthritis of left knee 05/05/2016  . Status post total right knee replacement 09/21/2014  . Gout 07/16/2013  . Hyperlipidemia, unspecified 07/16/2013  . Inflammatory arthritis 07/16/2013  . CKD (chronic kidney disease), stage III ([place]) 06/24/2013  . Benign essential hypertension 05/02/2013  . Varicose  veins of lower extremities with other complications 93/79/0240  . Breast screening, unspecified 09/17/2012  . Leg ulcer ([place]) 09/17/2012   Pura Spice, PT, DPT # [phone] 06/17/2018, 7:31 PM  [facility] [address] [place], [place], [address] Phone: [phone]   Fax:  [phone]  Name: NORAA PICKERAL MRN: [id number] Date of Birth: 1945-05-24

## 2018-06-19 ENCOUNTER — Ambulatory Visit: Payer: Medicare HMO | Admitting: Physical Therapy

## 2018-06-19 ENCOUNTER — Encounter: Payer: Self-pay | Admitting: Physical Therapy

## 2018-06-19 ENCOUNTER — Other Ambulatory Visit: Payer: Self-pay

## 2018-06-19 DIAGNOSIS — R2689 Other abnormalities of gait and mobility: Secondary | ICD-10-CM

## 2018-06-19 DIAGNOSIS — M25572 Pain in left ankle and joints of left foot: Secondary | ICD-10-CM

## 2018-06-19 DIAGNOSIS — M6281 Muscle weakness (generalized): Secondary | ICD-10-CM | POA: Diagnosis not present

## 2018-06-19 NOTE — Therapy (Signed)
[facility] [address] [place], [place], [address] Phone: [phone]   Fax:  [phone]  Physical Therapy Treatment  Patient Details  Name: Sheryl Kennedy MRN: [id number] Date of Birth: [birth year] Referring Provider (PT): Daylene Katayama   Encounter Date: 06/19/2018    Treatment: 4 of 12.  Recert date: 6/73/4193 1355 to 101   Past Medical History:  Diagnosis Date  . Arthritis   . Cancer ([place]) 08/2012   skin right anterior lower leg, squamous cell  . Gout   . Hx of seasonal allergies   . Hyperlipidemia   . Hypertension   . 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 complications     Past Surgical History:  Procedure Laterality Date  . BACK SURGERY  2008   no metal in back  . BREAST BIOPSY Right 09/13/2010   core/neg  . BREAST BIOPSY Right 12/09/2015   US biopsy/neg  . BREAST SURGERY Right 2012  . CHONDROPLASTY Left 01/22/2016   Procedure: arthroscopic medial AND LATERAL CHONDROPLASTY;  Surgeon: Dereck Leep, MD;  Location: [facility] ORS;  Service: Orthopedics;  Laterality: Left;  . COLONOSCOPY  2011  . COLONOSCOPY WITH PROPOFOL N/A 04/14/2015   Procedure: COLONOSCOPY WITH PROPOFOL;  Surgeon: Lollie Sails, MD;  Location: [facility] ENDOSCOPY;  Service: Endoscopy;  Laterality: N/A;  . CYST REMOVAL HAND Left 2012  . JOINT REPLACEMENT Right 09/30/2013   TOTAL KNEE REPLACEMENT, DR. Marry Guan, [place]  . KNEE ARTHROSCOPY WITH MEDIAL MENISECTOMY Left 01/22/2016   Procedure: KNEE ARTHROSCOPY WITH MEDIAL AND LATERAL MENISECTOMY;  Surgeon: Dereck Leep, MD;  Location: [facility] ORS;  Service: Orthopedics;  Laterality: Left;  . KNEE SURGERY  1997  . Stab pheblectomy  Left 2013  . TONSILLECTOMY    . VEIN CLOSURE Bilateral 2012    There were no vitals filed for this visit.   Pt. states she is officially retired from her job. Pt. entered PT without use of SPC and left the SPC in car. No falls or LOB.       There.ex.:  Scifit L5.5  10 min. B LE (maintain L heel position on foot plate) Standing prostretch 3x with static holds in //-bars TG knee flexion 20x/ heel raises with stretch 20x.  Min. Assist to get on/off TG.   Supine L ankle isometrics (manual)- 10x each Supine knee to chest with green ball 20x/ bridging 10x2.   Supine GTB hip abduction/ marching 20x.   Supine SAQ with pillow hold 20x.    Manual tx.:  Supine L ankle/ LE stretches (all planes of movement)- focus on DF and EV    PT Short Term Goals - 06/05/18 1533      PT SHORT TERM GOAL #1   Title  Pt will be compliant with initial HEP daily in preparation for self management of condition.    Baseline  initial HEP administered 06/05/2018    Time  3    Period  Weeks    Status  New    Target Date  06/26/18        PT Long Term Goals - 06/05/18 1526      PT LONG TERM GOAL #1   Title  Pt will be compliant with HEP for self management of condition to improve functional activities.     Baseline  initial HEP administered    Time  6    Period  Weeks    Status  New    Target Date  07/17/18  PT LONG TERM GOAL #2   Title  Pt will demonstrate LE strength MMT grade of 5/5 bilaterally to improve ability to perform functoinal activities    Baseline  see flowsheet for details 06/05/2018    Time  6    Period  Weeks    Status  New    Target Date  07/17/18      PT LONG TERM GOAL #3   Title  Pt will demonstrate full ROM of L ankle to indicate return to PLOF and decrease risk of falls.    Baseline  see flowsheet for details 06/05/2018    Time  6    Period  Weeks    Status  New    Target Date  07/17/18      PT LONG TERM GOAL #4   Title  Pt will demonstrate gait WFLs and ability to ambulate >62minutes to improve functional activities and decrease risk of falls.    Baseline  significant vaulting noted during ambulation, as well as circumduction with limb intermittently. Decreased L heel strike and toe off as well and decreased stride bilaterally 06/05/2018     Time  6    Period  Weeks    Status  New    Target Date  07/17/18      PT LONG TERM GOAL #5   Title  Patient's L ankle circumfrence via figure 8 method will be within .5 of an inch of R ankle to demonstrate decreased edema.     Baseline  21 in R ankle, 22.5 inch L ankle    Time  6    Period  Weeks    Status  New    Target Date  07/17/18        Pt. contiues to progress with LE strengthening ex. but continues to require cuing to correct lateral leaning with gait, esp. without use of SPC. Pt. very motivated to increase strengthening and progress standing tolerance/ walking independence. No c/o pain during tx. session but fatigue reported after TG/ Scifit. Added supine GTB hip abduction/ marching to HEP.       Patient will benefit from skilled therapeutic intervention in order to improve the following deficits and impairments:  Abnormal gait, Decreased balance, Decreased endurance, Decreased mobility, Difficulty walking, Hypomobility, Improper body mechanics, Decreased range of motion, Decreased activity tolerance, Decreased strength, Pain  Visit Diagnosis: 1. Muscle weakness (generalized)   2. Other abnormalities of gait and mobility   3. Pain in left ankle and joints of left foot        Problem List Patient Active Problem List   Diagnosis Date Noted  . Mid sternal chest pain 06/06/2017  . SOB (shortness of breath) 06/06/2017  . Presence of left artificial knee joint 05/13/2016  . Primary osteoarthritis of left knee 05/05/2016  . Status post total right knee replacement 09/21/2014  . Gout 07/16/2013  . Hyperlipidemia, unspecified 07/16/2013  . Inflammatory arthritis 07/16/2013  . CKD (chronic kidney disease), stage III ([place]) 06/24/2013  . Benign essential hypertension 05/02/2013  . 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 complications 83/41/9622  . Breast screening, unspecified 09/17/2012  . Leg ulcer ([place]) 09/17/2012   Pura Spice, PT, DPT #  [phone] 06/21/2018, 2:49 PM  [place] [facility] [address] [place], [place], [address] Phone: [phone]   Fax:  [phone]  Name: SHAWNISE PETERKIN MRN: [id number] Date of Birth: November 21, 1945

## 2018-06-21 ENCOUNTER — Other Ambulatory Visit: Payer: Self-pay

## 2018-06-21 ENCOUNTER — Ambulatory Visit: Payer: Medicare HMO | Admitting: Physical Therapy

## 2018-06-21 DIAGNOSIS — M6281 Muscle weakness (generalized): Secondary | ICD-10-CM | POA: Diagnosis not present

## 2018-06-21 DIAGNOSIS — R2689 Other abnormalities of gait and mobility: Secondary | ICD-10-CM

## 2018-06-21 DIAGNOSIS — M25572 Pain in left ankle and joints of left foot: Secondary | ICD-10-CM

## 2018-06-23 ENCOUNTER — Encounter: Payer: Self-pay | Admitting: Physical Therapy

## 2018-06-23 NOTE — Therapy (Signed)
[facility] [address] [place], [place], [address] Phone: [phone]   Fax:  [phone]  Physical Therapy Treatment  Patient Details  Name: Sheryl Kennedy MRN: [id number] Date of Birth: 1945-05-27 Referring Provider (PT): Daylene Katayama   Encounter Date: 06/21/2018  PT End of Session - 06/23/18 0857    Visit Number  5    Number of Visits  12    Date for PT Re-Evaluation  07/17/18    Authorization - Visit Number  5    Authorization - Number of Visits  10    PT Start Time  1401    PT Stop Time  1453    PT Time Calculation (min)  52 min    Activity Tolerance  Patient tolerated treatment well    Behavior During Therapy  [facility] for tasks assessed/performed       Past Medical History:  Diagnosis Date  . Arthritis   . Cancer ([place]) 08/2012   skin right anterior lower leg, squamous cell  . Gout   . Hx of seasonal allergies   . Hyperlipidemia   . Hypertension   . 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 complications     Past Surgical History:  Procedure Laterality Date  . BACK SURGERY  2008   no metal in back  . BREAST BIOPSY Right 09/13/2010   core/neg  . BREAST BIOPSY Right 12/09/2015   US biopsy/neg  . BREAST SURGERY Right 2012  . CHONDROPLASTY Left 01/22/2016   Procedure: arthroscopic medial AND LATERAL CHONDROPLASTY;  Surgeon: Dereck Leep, MD;  Location: [facility] ORS;  Service: Orthopedics;  Laterality: Left;  . COLONOSCOPY  2011  . COLONOSCOPY WITH PROPOFOL N/A 04/14/2015   Procedure: COLONOSCOPY WITH PROPOFOL;  Surgeon: Lollie Sails, MD;  Location: [facility] ENDOSCOPY;  Service: Endoscopy;  Laterality: N/A;  . CYST REMOVAL HAND Left 2012  . JOINT REPLACEMENT Right 09/30/2013   TOTAL KNEE REPLACEMENT, DR. Marry Guan, [place]  . KNEE ARTHROSCOPY WITH MEDIAL MENISECTOMY Left 01/22/2016   Procedure: KNEE ARTHROSCOPY WITH MEDIAL AND LATERAL MENISECTOMY;  Surgeon: Dereck Leep, MD;  Location: [facility] ORS;  Service: Orthopedics;   Laterality: Left;  . KNEE SURGERY  1997  . Stab pheblectomy  Left 2013  . TONSILLECTOMY    . VEIN CLOSURE Bilateral 2012    There were no vitals filed for this visit.  Subjective Assessment - 06/23/18 0854    Subjective  Pt. entered PT with lateral sway without use of SPC.  No c/o pain at this time.  Pt. states she is trying to stay active/ complete HEP.    Pertinent History  Pt with PMH of HTN, HLD, gout, skin cancer of R anterior lower leg, B TKR, back surgery without metal in back, CKD III. S/p anterior tibial tendon repair L ankle 04/18/2018. Physician given clearance to wean out of boot. Pt ambulated into clinic without boot and reported she is not wearing it. Planning to return to work next week.    Patient Stated Goals  Increase L ankle ROM/ strength and independence with gait.    Currently in Pain?  No/denies        There.ex.:  Nustep L5-610 min. B LE (warm-up/ discussed HEP and activity) Standing prostretch 3x with static holds in //-bars TG knee flexion 20x/ heel raises with stretch 20x.     Supine L ankle isometrics (manual)- 10x each Supine knee to chest with green ball 20x/ bridging 10x2.  Attempted standing GTB  ex. Around lower leg (lateral walking/ marching)- pain limited with GTB placement. BOSU static holds in //-bars L/R 15x each (UE assist required)  Manual tx.:  Supine L ankle/ LE stretches (all planes of movement)- focus on DF and EV    PT Short Term Goals - 06/05/18 1533      PT SHORT TERM GOAL #1   Title  Pt will be compliant with initial HEP daily in preparation for self management of condition.    Baseline  initial HEP administered 06/05/2018    Time  3    Period  Weeks    Status  New    Target Date  06/26/18        PT Long Term Goals - 06/05/18 1526      PT LONG TERM GOAL #1   Title  Pt will be compliant with HEP for self management of condition to improve functional activities.     Baseline  initial HEP administered    Time  6     Period  Weeks    Status  New    Target Date  07/17/18      PT LONG TERM GOAL #2   Title  Pt will demonstrate LE strength MMT grade of 5/5 bilaterally to improve ability to perform functoinal activities    Baseline  see flowsheet for details 06/05/2018    Time  6    Period  Weeks    Status  New    Target Date  07/17/18      PT LONG TERM GOAL #3   Title  Pt will demonstrate full ROM of L ankle to indicate return to PLOF and decrease risk of falls.    Baseline  see flowsheet for details 06/05/2018    Time  6    Period  Weeks    Status  New    Target Date  07/17/18      PT LONG TERM GOAL #4   Title  Pt will demonstrate gait WFLs and ability to ambulate >50minutes to improve functional activities and decrease risk of falls.    Baseline  significant vaulting noted during ambulation, as well as circumduction with limb intermittently. Decreased L heel strike and toe off as well and decreased stride bilaterally 06/05/2018    Time  6    Period  Weeks    Status  New    Target Date  07/17/18      PT LONG TERM GOAL #5   Title  Patient's L ankle circumfrence via figure 8 method will be within .5 of an inch of R ankle to demonstrate decreased edema.     Baseline  21 in R ankle, 22.5 inch L ankle    Time  6    Period  Weeks    Status  New    Target Date  07/17/18            Plan - 06/23/18 0858    Clinical Impression Statement  Tx. focused on LE strengthening/ stabilization ex. program.  Pt. did well with addition of BOSU lunges/ lower leg stability.  Pt. unable to tolerate GTB around lower leg secondary to discomfort.  Pt. works hard during tx. session and will benefit from continued LE muscle strengthening to improve standing tolerance/ independence with gait.    Personal Factors and Comorbidities  Age;Comorbidity 3+    Comorbidities   HTN, HLD, gout, skin cancer of R anterior lower leg, B TKR, back surgery without metal in back, CKD  III.    Examination-Activity Limitations   Stairs;Stand;Locomotion Level    Examination-Participation Restrictions  Yard Work;Cleaning;Community Activity    Stability/Clinical Decision Making  Stable/Uncomplicated    Rehab Potential  Good    PT Frequency  2x / week    PT Duration  6 weeks    PT Treatment/Interventions  ADLs/Self Care Home Management;Gait training;Cryotherapy;Therapeutic exercise;Patient/family education;Splinting;Taping;Scar mobilization;Manual techniques;Balance training;Stair training;Electrical Stimulation;Aquatic Therapy;Moist Heat;DME Instruction;Therapeutic activities;Functional mobility training;Neuromuscular re-education;Passive range of motion;Dry needling;Vestibular;Joint Manipulations    PT Next Visit Plan  Balance training, LE strengthening.  SPC use    PT Home Exercise Plan  [email or web address] medbridge access code (ankle theraband strengthening, ankle alphabet)       Patient will benefit from skilled therapeutic intervention in order to improve the following deficits and impairments:  Abnormal gait, Decreased balance, Decreased endurance, Decreased mobility, Difficulty walking, Hypomobility, Improper body mechanics, Decreased range of motion, Decreased activity tolerance, Decreased strength, Pain  Visit Diagnosis: 1. Muscle weakness (generalized)   2. Other abnormalities of gait and mobility   3. Pain in left ankle and joints of left foot        Problem List Patient Active Problem List   Diagnosis Date Noted  . Mid sternal chest pain 06/06/2017  . SOB (shortness of breath) 06/06/2017  . Presence of left artificial knee joint 05/13/2016  . Primary osteoarthritis of left knee 05/05/2016  . Status post total right knee replacement 09/21/2014  . Gout 07/16/2013  . Hyperlipidemia, unspecified 07/16/2013  . Inflammatory arthritis 07/16/2013  . CKD (chronic kidney disease), stage III ([place]) 06/24/2013  . Benign essential hypertension 05/02/2013  . 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 complications  91/47/8295  . Breast screening, unspecified 09/17/2012  . Leg ulcer ([place]) 09/17/2012   Pura Spice, PT, DPT # [phone] 06/23/2018, 9:06 AM  [facility] [address] [place], [place], [address] Phone: [phone]   Fax:  [phone]  Name: Sheryl Kennedy MRN: [id number] Date of Birth: 09-Dec-1945

## 2018-06-26 ENCOUNTER — Encounter: Payer: Self-pay | Admitting: Physical Therapy

## 2018-06-26 ENCOUNTER — Ambulatory Visit: Payer: Medicare HMO | Admitting: Physical Therapy

## 2018-06-26 ENCOUNTER — Other Ambulatory Visit: Payer: Self-pay

## 2018-06-26 DIAGNOSIS — M25572 Pain in left ankle and joints of left foot: Secondary | ICD-10-CM

## 2018-06-26 DIAGNOSIS — M6281 Muscle weakness (generalized): Secondary | ICD-10-CM

## 2018-06-26 DIAGNOSIS — R2689 Other abnormalities of gait and mobility: Secondary | ICD-10-CM

## 2018-06-26 NOTE — Therapy (Signed)
[facility] [address] [place], [place], [address] Phone: [phone]   Fax:  [phone]  Physical Therapy Treatment  Patient Details  Name: Sheryl Kennedy MRN: [id number] Date of Birth: June 21, 1945 Referring Provider (PT): Daylene Katayama   Encounter Date: 06/26/2018    Treatment: 6 of 12.  Recert date: 3/66/4403 1358 to 1453   Past Medical History:  Diagnosis Date  . Arthritis   . Cancer ([place]) 08/2012   skin right anterior lower leg, squamous cell  . Gout   . Hx of seasonal allergies   . Hyperlipidemia   . Hypertension   . 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 complications     Past Surgical History:  Procedure Laterality Date  . BACK SURGERY  2008   no metal in back  . BREAST BIOPSY Right 09/13/2010   core/neg  . BREAST BIOPSY Right 12/09/2015   US biopsy/neg  . BREAST SURGERY Right 2012  . CHONDROPLASTY Left 01/22/2016   Procedure: arthroscopic medial AND LATERAL CHONDROPLASTY;  Surgeon: Dereck Leep, MD;  Location: [facility] ORS;  Service: Orthopedics;  Laterality: Left;  . COLONOSCOPY  2011  . COLONOSCOPY WITH PROPOFOL N/A 04/14/2015   Procedure: COLONOSCOPY WITH PROPOFOL;  Surgeon: Lollie Sails, MD;  Location: [facility]COPY;  Service: Endoscopy;  Laterality: N/A;  . CYST REMOVAL HAND Left 2012  . JOINT REPLACEMENT Right 09/30/2013   TOTAL KNEE REPLACEMENT, DR. Marry Guan, [place]  . KNEE ARTHROSCOPY WITH MEDIAL MENISECTOMY Left 01/22/2016   Procedure: KNEE ARTHROSCOPY WITH MEDIAL AND LATERAL MENISECTOMY;  Surgeon: Dereck Leep, MD;  Location: [facility] ORS;  Service: Orthopedics;  Laterality: Left;  . KNEE SURGERY  1997  . Stab pheblectomy  Left 2013  . TONSILLECTOMY    . VEIN CLOSURE Bilateral 2012    There were no vitals filed for this visit.    No c/o pain at this time. Pt. has been busy with household chores.       Pt. Going to beach this weekend.    There.ex.:  Nustep L610 min. B LE (warm-up)   Walking partial lunges in //-bars 10x.  Standing hip extension/ abduction (no wt. With light to no UE assist)- cuing and mirror feedback for posture.  Seated marching/ LAQ/ heel and toe raises 30x each.   TGknee flexion 20x/ heel raises with stretch 20x.   Supine L ankle isometrics (manual)- 10x each BOSU static holds in //-bars L/R 10x each (attemping with no UE but CGA for safety) Supine L ankle/ LE stretches (all planes of movement).    PT Short Term Goals - 06/05/18 1533      PT SHORT TERM GOAL #1   Title  Pt will be compliant with initial HEP daily in preparation for self management of condition.    Baseline  initial HEP administered 06/05/2018    Time  3    Period  Weeks    Status  New    Target Date  06/26/18        PT Long Term Goals - 06/05/18 1526      PT LONG TERM GOAL #1   Title  Pt will be compliant with HEP for self management of condition to improve functional activities.     Baseline  initial HEP administered    Time  6    Period  Weeks    Status  New    Target Date  07/17/18      PT LONG TERM GOAL #  2   Title  Pt will demonstrate LE strength MMT grade of 5/5 bilaterally to improve ability to perform functoinal activities    Baseline  see flowsheet for details 06/05/2018    Time  6    Period  Weeks    Status  New    Target Date  07/17/18      PT LONG TERM GOAL #3   Title  Pt will demonstrate full ROM of L ankle to indicate return to PLOF and decrease risk of falls.    Baseline  see flowsheet for details 06/05/2018    Time  6    Period  Weeks    Status  New    Target Date  07/17/18      PT LONG TERM GOAL #4   Title  Pt will demonstrate gait WFLs and ability to ambulate >52minutes to improve functional activities and decrease risk of falls.    Baseline  significant vaulting noted during ambulation, as well as circumduction with limb intermittently. Decreased L heel strike and toe off as well and decreased stride bilaterally 06/05/2018    Time  6     Period  Weeks    Status  New    Target Date  07/17/18      PT LONG TERM GOAL #5   Title  Patient's L ankle circumfrence via figure 8 method will be within .5 of an inch of R ankle to demonstrate decreased edema.     Baseline  21 in R ankle, 22.5 inch L ankle    Time  6    Period  Weeks    Status  New    Target Date  07/17/18        Pt. able to maintain standing posture on BOSU with no UE assist for 5-10 sec. Pt. ambulates with increase cadence/ step pattern but has difficulty correcting lateral lean without assistive device. Pt. working hard with LE strengthening ex. program with increase static balance noted. No LOB during tx. session.       Patient will benefit from skilled therapeutic intervention in order to improve the following deficits and impairments:  Abnormal gait, Decreased balance, Decreased endurance, Decreased mobility, Difficulty walking, Hypomobility, Improper body mechanics, Decreased range of motion, Decreased activity tolerance, Decreased strength, Pain  Visit Diagnosis: 1. Muscle weakness (generalized)   2. Other abnormalities of gait and mobility   3. Pain in left ankle and joints of left foot        Problem List Patient Active Problem List   Diagnosis Date Noted  . Mid sternal chest pain 06/06/2017  . SOB (shortness of breath) 06/06/2017  . Presence of left artificial knee joint 05/13/2016  . Primary osteoarthritis of left knee 05/05/2016  . Status post total right knee replacement 09/21/2014  . Gout 07/16/2013  . Hyperlipidemia, unspecified 07/16/2013  . Inflammatory arthritis 07/16/2013  . CKD (chronic kidney disease), stage III ([place]) 06/24/2013  . Benign essential hypertension 05/02/2013  . 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 complications 81/01/7508  . Breast screening, unspecified 09/17/2012  . Leg ulcer ([place]) 09/17/2012   Pura Spice, PT, DPT # [phone] 06/28/2018, 12:51 PM  [facility] [address] [place], [place], [address] Phone: [phone]   Fax:  [phone]  Name: Sheryl Kennedy MRN: [id number] Date of Birth: 11-14-1945

## 2018-06-28 ENCOUNTER — Ambulatory Visit: Payer: Medicare HMO | Admitting: Physical Therapy

## 2018-06-28 ENCOUNTER — Other Ambulatory Visit: Payer: Self-pay

## 2018-06-28 ENCOUNTER — Encounter: Payer: Self-pay | Admitting: Physical Therapy

## 2018-06-28 DIAGNOSIS — M6281 Muscle weakness (generalized): Secondary | ICD-10-CM | POA: Diagnosis not present

## 2018-06-28 DIAGNOSIS — R2689 Other abnormalities of gait and mobility: Secondary | ICD-10-CM

## 2018-06-28 DIAGNOSIS — M25572 Pain in left ankle and joints of left foot: Secondary | ICD-10-CM

## 2018-06-28 NOTE — Therapy (Signed)
[facility] [address] [place], [place], [address] Phone: [phone]   Fax:  [phone]  Physical Therapy Treatment  Patient Details  Name: Sheryl Kennedy MRN: [id number] Date of Birth: 07/25/1945 Referring Provider (PT): Daylene Katayama   Encounter Date: 06/28/2018  PT End of Session - 06/28/18 1406    Visit Number  7    Number of Visits  12    Date for PT Re-Evaluation  07/17/18    Authorization - Visit Number  7    Authorization - Number of Visits  10    PT Start Time  6063    PT Stop Time  1455    PT Time Calculation (min)  53 min    Activity Tolerance  Patient tolerated treatment well    Behavior During Therapy  [facility] for tasks assessed/performed       Past Medical History:  Diagnosis Date  . Arthritis   . Cancer ([place]) 08/2012   skin right anterior lower leg, squamous cell  . Gout   . Hx of seasonal allergies   . Hyperlipidemia   . Hypertension   . 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 complications     Past Surgical History:  Procedure Laterality Date  . BACK SURGERY  2008   no metal in back  . BREAST BIOPSY Right 09/13/2010   core/neg  . BREAST BIOPSY Right 12/09/2015   US biopsy/neg  . BREAST SURGERY Right 2012  . CHONDROPLASTY Left 01/22/2016   Procedure: arthroscopic medial AND LATERAL CHONDROPLASTY;  Surgeon: Dereck Leep, MD;  Location: [facility] ORS;  Service: Orthopedics;  Laterality: Left;  . COLONOSCOPY  2011  . COLONOSCOPY WITH PROPOFOL N/A 04/14/2015   Procedure: COLONOSCOPY WITH PROPOFOL;  Surgeon: Lollie Sails, MD;  Location: [facility] ENDOSCOPY;  Service: Endoscopy;  Laterality: N/A;  . CYST REMOVAL HAND Left 2012  . JOINT REPLACEMENT Right 09/30/2013   TOTAL KNEE REPLACEMENT, DR. Marry Guan, [place]  . KNEE ARTHROSCOPY WITH MEDIAL MENISECTOMY Left 01/22/2016   Procedure: KNEE ARTHROSCOPY WITH MEDIAL AND LATERAL MENISECTOMY;  Surgeon: Dereck Leep, MD;  Location: [facility] ORS;  Service: Orthopedics;   Laterality: Left;  . KNEE SURGERY  1997  . Stab pheblectomy  Left 2013  . TONSILLECTOMY    . VEIN CLOSURE Bilateral 2012    There were no vitals filed for this visit.  Subjective Assessment - 06/28/18 1403    Subjective  No pain today and pt. states she has been busy since her birthday yesterday.    Pertinent History  Pt with PMH of HTN, HLD, gout, skin cancer of R anterior lower leg, B TKR, back surgery without metal in back, CKD III. S/p anterior tibial tendon repair L ankle 04/18/2018. Physician given clearance to wean out of boot. Pt ambulated into clinic without boot and reported she is not wearing it. Planning to return to work next week.    Patient Stated Goals  Increase L ankle ROM/ strength and independence with gait.    Currently in Pain?  No/denies         There.ex.:  Nustep L710 min. B LE (warm-up) See HEP (forward/ lateral step ups/ lunges) 4# LE ex.: LAQ/ marching/ heel and toe raises 20x.   Walking partial lunges (4#) in //-bars 10x.  Moderate cuing to slow down/ increase upright posture. Supine R gastroc/ ankle stretches 5 min.  Supine R ankle isometrics (manual with moderate resistance)- 10x each Reverse BOSU (no UE assist)- added  wt. Shifting.  Walking in PT clinic without UE assist working on hip control/ step pattern while maintain a consistent BOS.     PT Short Term Goals - 06/28/18 1547      PT SHORT TERM GOAL #1   Title  Pt will be compliant with initial HEP daily in preparation for self management of condition.    Baseline  Independent    Time  3    Period  Weeks    Status  Achieved    Target Date  06/28/18        PT Long Term Goals - 06/05/18 1526      PT LONG TERM GOAL #1   Title  Pt will be compliant with HEP for self management of condition to improve functional activities.     Baseline  initial HEP administered    Time  6    Period  Weeks    Status  New    Target Date  07/17/18      PT LONG TERM GOAL #2   Title  Pt will demonstrate  LE strength MMT grade of 5/5 bilaterally to improve ability to perform functoinal activities    Baseline  see flowsheet for details 06/05/2018    Time  6    Period  Weeks    Status  New    Target Date  07/17/18      PT LONG TERM GOAL #3   Title  Pt will demonstrate full ROM of L ankle to indicate return to PLOF and decrease risk of falls.    Baseline  see flowsheet for details 06/05/2018    Time  6    Period  Weeks    Status  New    Target Date  07/17/18      PT LONG TERM GOAL #4   Title  Pt will demonstrate gait WFLs and ability to ambulate >67minutes to improve functional activities and decrease risk of falls.    Baseline  significant vaulting noted during ambulation, as well as circumduction with limb intermittently. Decreased L heel strike and toe off as well and decreased stride bilaterally 06/05/2018    Time  6    Period  Weeks    Status  New    Target Date  07/17/18      PT LONG TERM GOAL #5   Title  Patient's L ankle circumfrence via figure 8 method will be within .5 of an inch of R ankle to demonstrate decreased edema.     Baseline  21 in R ankle, 22.5 inch L ankle    Time  6    Period  Weeks    Status  New    Target Date  07/17/18         Plan - 06/28/18 1408    Clinical Impression Statement  Pt. did really well wtih static/dynamic balance tasks on reverse BOSU.  Pt. able to wt. shift without use of UE on BOSU and control forward/ lateral step ups with light UE assist.  Pt. stil requires slight UE assist with step ups/ lunges due to hip/quad muscle weakness.  Pt. will be going to beach next week and instructed to continue with HEP and will return to PT on 7/7.    Personal Factors and Comorbidities  Age;Comorbidity 3+    Comorbidities   HTN, HLD, gout, skin cancer of R anterior lower leg, B TKR, back surgery without metal in back, CKD III.    Examination-Activity Limitations  Stairs;Stand;Locomotion  Level    Examination-Participation Restrictions  Yard  Work;Cleaning;Community Activity    Stability/Clinical Decision Making  Stable/Uncomplicated    Clinical Decision Making  Moderate    Rehab Potential  Good    PT Frequency  2x / week    PT Duration  6 weeks    PT Treatment/Interventions  ADLs/Self Care Home Management;Gait training;Cryotherapy;Therapeutic exercise;Patient/family education;Splinting;Taping;Scar mobilization;Manual techniques;Balance training;Stair training;Electrical Stimulation;Aquatic Therapy;Moist Heat;DME Instruction;Therapeutic activities;Functional mobility training;Neuromuscular re-education;Passive range of motion;Dry needling;Vestibular;Joint Manipulations    PT Next Visit Plan  Balance training, LE strengthening.  Discuss beach vacation/ Check goals.    PT Home Exercise Plan  [email or web address] medbridge access code (ankle theraband strengthening, ankle alphabet)       Patient will benefit from skilled therapeutic intervention in order to improve the following deficits and impairments:  Abnormal gait, Decreased balance, Decreased endurance, Decreased mobility, Difficulty walking, Hypomobility, Improper body mechanics, Decreased range of motion, Decreased activity tolerance, Decreased strength, Pain  Visit Diagnosis: 1. Muscle weakness (generalized)   2. Other abnormalities of gait and mobility   3. Pain in left ankle and joints of left foot        Problem List Patient Active Problem List   Diagnosis Date Noted  . Mid sternal chest pain 06/06/2017  . SOB (shortness of breath) 06/06/2017  . Presence of left artificial knee joint 05/13/2016  . Primary osteoarthritis of left knee 05/05/2016  . Status post total right knee replacement 09/21/2014  . Gout 07/16/2013  . Hyperlipidemia, unspecified 07/16/2013  . Inflammatory arthritis 07/16/2013  . CKD (chronic kidney disease), stage III ([place]) 06/24/2013  . Benign essential hypertension 05/02/2013  . 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 complications 12/75/1700   . Breast screening, unspecified 09/17/2012  . Leg ulcer ([place]) 09/17/2012   Pura Spice, PT, DPT # [phone] 06/28/2018, 3:48 PM   [facility] [address] [place], [place], [address] Phone: [phone]   Fax:  [phone]  Name: Sheryl Kennedy MRN: [id number] Date of Birth: 1945-04-21

## 2018-07-03 ENCOUNTER — Encounter: Payer: Medicare HMO | Admitting: Physical Therapy

## 2018-07-05 ENCOUNTER — Encounter: Payer: Medicare HMO | Admitting: Physical Therapy

## 2018-07-10 ENCOUNTER — Ambulatory Visit: Payer: Medicare HMO | Admitting: Physical Therapy

## 2018-07-12 ENCOUNTER — Ambulatory Visit: Payer: Medicare HMO | Attending: Podiatry | Admitting: Physical Therapy

## 2018-07-12 ENCOUNTER — Other Ambulatory Visit: Payer: Self-pay

## 2018-07-12 ENCOUNTER — Encounter: Payer: Self-pay | Admitting: Physical Therapy

## 2018-07-12 DIAGNOSIS — M25572 Pain in left ankle and joints of left foot: Secondary | ICD-10-CM | POA: Insufficient documentation

## 2018-07-12 DIAGNOSIS — R2689 Other abnormalities of gait and mobility: Secondary | ICD-10-CM | POA: Diagnosis present

## 2018-07-12 DIAGNOSIS — M6281 Muscle weakness (generalized): Secondary | ICD-10-CM | POA: Diagnosis present

## 2018-07-12 NOTE — Therapy (Signed)
[facility] [address] [place], [place], [address] Phone: [phone]   Fax:  [phone]  Physical Therapy Treatment  Patient Details  Name: Sheryl Kennedy MRN: [id number] Date of Birth: [birth year] Referring Provider (PT): Daylene Katayama   Encounter Date: 07/12/2018  PT End of Session - 07/12/18 1359    Visit Number  8    Number of Visits  12    Date for PT Re-Evaluation  07/17/18    Authorization - Visit Number  8    Authorization - Number of Visits  10    PT Start Time  2878    PT Stop Time  1445    PT Time Calculation (min)  50 min    Equipment Utilized During Treatment  Gait belt    Activity Tolerance  Patient tolerated treatment well    Behavior During Therapy  [facility] for tasks assessed/performed       Past Medical History:  Diagnosis Date  . Arthritis   . Cancer ([place]) 08/2012   skin right anterior lower leg, squamous cell  . Gout   . Hx of seasonal allergies   . Hyperlipidemia   . Hypertension   . 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 complications     Past Surgical History:  Procedure Laterality Date  . BACK SURGERY  2008   no metal in back  . BREAST BIOPSY Right 09/13/2010   core/neg  . BREAST BIOPSY Right 12/09/2015   US biopsy/neg  . BREAST SURGERY Right 2012  . CHONDROPLASTY Left 01/22/2016   Procedure: arthroscopic medial AND LATERAL CHONDROPLASTY;  Surgeon: Dereck Leep, MD;  Location: [facility] ORS;  Service: Orthopedics;  Laterality: Left;  . COLONOSCOPY  2011  . COLONOSCOPY WITH PROPOFOL N/A 04/14/2015   Procedure: COLONOSCOPY WITH PROPOFOL;  Surgeon: Lollie Sails, MD;  Location: [facility] ENDOSCOPY;  Service: Endoscopy;  Laterality: N/A;  . CYST REMOVAL HAND Left 2012  . JOINT REPLACEMENT Right 09/30/2013   TOTAL KNEE REPLACEMENT, DR. Marry Guan, [place]  . KNEE ARTHROSCOPY WITH MEDIAL MENISECTOMY Left 01/22/2016   Procedure: KNEE ARTHROSCOPY WITH MEDIAL AND LATERAL MENISECTOMY;  Surgeon: Dereck Leep,  MD;  Location: [facility] ORS;  Service: Orthopedics;  Laterality: Left;  . KNEE SURGERY  1997  . Stab pheblectomy  Left 2013  . TONSILLECTOMY    . VEIN CLOSURE Bilateral 2012    There were no vitals filed for this visit.  Subjective Assessment - 07/12/18 1356    Subjective  Pt reports she had two falls this past week while at the beach (walking in sand).  Pt reports no injury due to falls. Pt states she was able to get back up independently when she fell in the sand, but that she fell a second time ambulating by the water and that she had assistance getting back up. Pt reports she missed her last appointment due to fatigue from household chores and travel.    Pertinent History  Pt with PMH of HTN, HLD, gout, skin cancer of R anterior lower leg, B TKR, back surgery without metal in back, CKD III. S/p anterior tibial tendon repair L ankle 04/18/2018. Physician given clearance to wean out of boot. Pt ambulated into clinic without boot and reported she is not wearing it. Planning to return to work next week.    Patient Stated Goals  Increase L ankle ROM/ strength and independence with gait.    Currently in Pain?  No/denies  There.ex.:  Nustep L710 min. B LE (warm-up). Pt reports B knee pain, cued to slow pace and decreased level from 7 at ~7 min to level 5 and reports improvement in knee pain. Walking/high knee marches in //-bars forward and lateral with UE assist. 10x.  Cuing to increase upright posture.  Seated L/R gastroc/ ankle stretches 5 min  Seated heel and toe raises - x25 cuing for technique L<R Supine L ankle stretches - 3 min Supine L ankle isometrics - manual with moderate resistance   R/L quad/hamstring both 5/5  R/L hip flexors both 4+/5 R/L Hip abduction both 5/5 R/L Hip adduction both 4+/5 R dorsiflexion/plantarflexion both 5/5 L dorsiflexion 4/5 L plantarflexion 5/5  Left ankle figure 8 measurement: 21 inches  L dorsiflexion: 4 degrees L plantarflexion: 54  degrees  L/R ankle AROM: L limited compared to R  Walking in //-bars: high flexion/ lateral walking 5x in //-bars (min. UE assist)  Supine R ankle isometrics (manual with moderate resistance)- 5x eachwith 10 sec. holds Obstacle course (manevering cones/ step overs/ ups/ Airex/ cone taps):  HHA with cone taps.  Walking in PT clinic without UE assist working on hip control/ step pattern while maintain a consistent BOS.     PT Short Term Goals - 06/28/18 1547      PT SHORT TERM GOAL #1   Title  Pt will be compliant with initial HEP daily in preparation for self management of condition.    Baseline  Independent    Time  3    Period  Weeks    Status  Achieved    Target Date  06/28/18        PT Long Term Goals - 06/05/18 1526      PT LONG TERM GOAL #1   Title  Pt will be compliant with HEP for self management of condition to improve functional activities.     Baseline  initial HEP administered    Time  6    Period  Weeks    Status  New    Target Date  07/17/18      PT LONG TERM GOAL #2   Title  Pt will demonstrate LE strength MMT grade of 5/5 bilaterally to improve ability to perform functoinal activities    Baseline  see flowsheet for details 06/05/2018    Time  6    Period  Weeks    Status  New    Target Date  07/17/18      PT LONG TERM GOAL #3   Title  Pt will demonstrate full ROM of L ankle to indicate return to PLOF and decrease risk of falls.    Baseline  see flowsheet for details 06/05/2018    Time  6    Period  Weeks    Status  New    Target Date  07/17/18      PT LONG TERM GOAL #4   Title  Pt will demonstrate gait WFLs and ability to ambulate >83minutes to improve functional activities and decrease risk of falls.    Baseline  significant vaulting noted during ambulation, as well as circumduction with limb intermittently. Decreased L heel strike and toe off as well and decreased stride bilaterally 06/05/2018    Time  6    Period  Weeks    Status  New    Target  Date  07/17/18      PT LONG TERM GOAL #5   Title  Patient's L ankle circumfrence via figure  8 method will be within .5 of an inch of R ankle to demonstrate decreased edema.     Baseline  21 in R ankle, 22.5 inch L ankle    Time  6    Period  Weeks    Status  New    Target Date  07/17/18            Plan - 07/12/18 1400    Clinical Impression Statement  Pt continues to present with moderate L lower leg/ ankle swelling.  No significant improvement in swelling and pt. reports she will occasionally wear compression stockings.  PT recommened that pt continue to wear compression stocking due to swelling and active lifestyle. Pt demonstrates difficulty with ambulating around and over obstacles d/t decreased strength/stability of the L ankle musculature indicating increased fall risk. Pt required CGA/handheld assist with obstacle course exercise due to LOB with stepping around and over steps/ cones/ Airex pad. Pt's L ankle ROM (DF: 4 deg.) and strength decreased compared to R.  Pt continues to demonstrate abnormal gait mechanics (limited L ankle IV/ ankle strength) and will benefit from use of SPC.  Pt will continue to benefit from further physical therapy to improve ankle strength, ROM, and to decrease fall risk.    Personal Factors and Comorbidities  Age;Comorbidity 3+    Comorbidities   HTN, HLD, gout, skin cancer of R anterior lower leg, B TKR, back surgery without metal in back, CKD III.    Examination-Activity Limitations  Stairs;Stand;Locomotion Level    Examination-Participation Restrictions  Yard Work;Cleaning;Community Activity    Stability/Clinical Decision Making  Stable/Uncomplicated    Rehab Potential  Good    PT Frequency  2x / week    PT Duration  6 weeks    PT Treatment/Interventions  ADLs/Self Care Home Management;Gait training;Cryotherapy;Therapeutic exercise;Patient/family education;Splinting;Taping;Scar mobilization;Manual techniques;Balance training;Stair training;Electrical  Stimulation;Aquatic Therapy;Moist Heat;DME Instruction;Therapeutic activities;Functional mobility training;Neuromuscular re-education;Passive range of motion;Dry needling;Vestibular;Joint Manipulations    PT Next Visit Plan  Balance training, LE strengthening.    PT Home Exercise Plan  [email or web address] medbridge access code (ankle theraband strengthening, ankle alphabet)       Patient will benefit from skilled therapeutic intervention in order to improve the following deficits and impairments:  Abnormal gait, Decreased balance, Decreased endurance, Decreased mobility, Difficulty walking, Hypomobility, Improper body mechanics, Decreased range of motion, Decreased activity tolerance, Decreased strength, Pain  Visit Diagnosis: 1. Muscle weakness (generalized)   2. Other abnormalities of gait and mobility   3. Pain in left ankle and joints of left foot        Problem List Patient Active Problem List   Diagnosis Date Noted  . Mid sternal chest pain 06/06/2017  . SOB (shortness of breath) 06/06/2017  . Presence of left artificial knee joint 05/13/2016  . Primary osteoarthritis of left knee 05/05/2016  . Status post total right knee replacement 09/21/2014  . Gout 07/16/2013  . Hyperlipidemia, unspecified 07/16/2013  . Inflammatory arthritis 07/16/2013  . CKD (chronic kidney disease), stage III ([place]) 06/24/2013  . Benign essential hypertension 05/02/2013  . 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 complications 12/26/8248  . Breast screening, unspecified 09/17/2012  . Leg ulcer ([place]) 09/17/2012   Pura Spice, PT, DPT # [phone] 07/12/2018, 3:15 PM  [facility] [address] [place], [place], [address] Phone: [phone]   Fax:  [phone]  Name: Sheryl Kennedy MRN: [id number] Date of Birth: [birth year]

## 2018-07-13 ENCOUNTER — Other Ambulatory Visit: Payer: Self-pay

## 2018-07-13 ENCOUNTER — Encounter: Payer: Self-pay | Admitting: Podiatry

## 2018-07-13 ENCOUNTER — Ambulatory Visit (INDEPENDENT_AMBULATORY_CARE_PROVIDER_SITE_OTHER): Payer: Medicare HMO | Admitting: Podiatry

## 2018-07-13 VITALS — Temp 97.2°F

## 2018-07-13 DIAGNOSIS — M66272 Spontaneous rupture of extensor tendons, left ankle and foot: Secondary | ICD-10-CM

## 2018-07-13 DIAGNOSIS — Z9889 Other specified postprocedural states: Secondary | ICD-10-CM

## 2018-07-16 NOTE — Progress Notes (Signed)
   Subjective:  Patient presents today status post repair anterior tibial tendon left. DOS: 04/18/2018. She states she is doing well and improving. She reports some intermittent aching with associated swelling. She denies any modifying factors. She has completed physical therapy. Patient is here for further evaluation and treatment.   Past Medical History:  Diagnosis Date  . Arthritis   . Cancer ([place]) 08/2012   skin right anterior lower leg, squamous cell  . Gout   . Hx of seasonal allergies   . Hyperlipidemia   . Hypertension   . 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 complications       Objective/Physical Exam Neurovascular status intact.  Skin incisions appear to be well coapted. No sign of infectious process noted. No dehiscence. No active bleeding noted. Moderate edema noted to the surgical extremity.  Assessment: 1. s/p repair anterior tibial tendon left. DOS: 04/18/2018  Plan of Care:  1. Patient was evaluated. 2. May resume full activity with no restrictions.  3. Recommended good shoe gear.  4. Return to clinic as needed.    Edrick Kins, DPM [facility]  Dr. Edrick Kins, [address]                                        [place], [place] [id number]                Office [phone]  Fax [phone]

## 2018-07-17 ENCOUNTER — Ambulatory Visit: Payer: Medicare HMO | Admitting: Physical Therapy

## 2018-09-10 ENCOUNTER — Emergency Department
Admission: EM | Admit: 2018-09-10 | Discharge: 2018-09-11 | Disposition: A | Discharge status: Home / Self Care | Payer: Medicare HMO | Attending: Emergency Medicine | Admitting: Emergency Medicine

## 2018-09-10 ENCOUNTER — Encounter: Payer: Self-pay | Admitting: Emergency Medicine

## 2018-09-10 ENCOUNTER — Emergency Department: Payer: Medicare HMO

## 2018-09-10 ENCOUNTER — Other Ambulatory Visit: Payer: Self-pay

## 2018-09-10 DIAGNOSIS — S0181XA Laceration without foreign body of other part of head, initial encounter: Secondary | ICD-10-CM

## 2018-09-10 DIAGNOSIS — S01412A Laceration without foreign body of left cheek and temporomandibular area, initial encounter: Secondary | ICD-10-CM | POA: Insufficient documentation

## 2018-09-10 DIAGNOSIS — S0083XA Contusion of other part of head, initial encounter: Secondary | ICD-10-CM | POA: Insufficient documentation

## 2018-09-10 DIAGNOSIS — Z79899 Other long term (current) drug therapy: Secondary | ICD-10-CM | POA: Diagnosis not present

## 2018-09-10 DIAGNOSIS — Y999 Unspecified external cause status: Secondary | ICD-10-CM | POA: Diagnosis not present

## 2018-09-10 DIAGNOSIS — Z7982 Long term (current) use of aspirin: Secondary | ICD-10-CM | POA: Diagnosis not present

## 2018-09-10 DIAGNOSIS — W01190A Fall on same level from slipping, tripping and stumbling with subsequent striking against furniture, initial encounter: Secondary | ICD-10-CM | POA: Diagnosis not present

## 2018-09-10 DIAGNOSIS — Y92099 Unspecified place in other non-institutional residence as the place of occurrence of the external cause: Secondary | ICD-10-CM | POA: Insufficient documentation

## 2018-09-10 DIAGNOSIS — Y939 Activity, unspecified: Secondary | ICD-10-CM | POA: Diagnosis not present

## 2018-09-10 DIAGNOSIS — S098XXA Other specified injuries of head, initial encounter: Secondary | ICD-10-CM | POA: Diagnosis present

## 2018-09-10 DIAGNOSIS — S065XAA Traumatic subdural hemorrhage with loss of consciousness status unknown, initial encounter: Secondary | ICD-10-CM

## 2018-09-10 DIAGNOSIS — S065X9A Traumatic subdural hemorrhage with loss of consciousness of unspecified duration, initial encounter: Secondary | ICD-10-CM

## 2018-09-10 DIAGNOSIS — I129 Hypertensive chronic kidney disease with stage 1 through stage 4 chronic kidney disease, or unspecified chronic kidney disease: Secondary | ICD-10-CM | POA: Diagnosis not present

## 2018-09-10 DIAGNOSIS — N183 Chronic kidney disease, stage 3 (moderate): Secondary | ICD-10-CM | POA: Diagnosis not present

## 2018-09-10 DIAGNOSIS — W19XXXA Unspecified fall, initial encounter: Secondary | ICD-10-CM

## 2018-09-10 DIAGNOSIS — S0990XA Unspecified injury of head, initial encounter: Secondary | ICD-10-CM

## 2018-09-10 MED ORDER — SODIUM CHLORIDE 0.9 % IV BOLUS
500.0000 mL | Freq: Once | INTRAVENOUS | Status: AC
  Administered 2018-09-11: 500 mL via INTRAVENOUS

## 2018-09-10 MED ORDER — LIDOCAINE-EPINEPHRINE 2 %-1:100000 IJ SOLN
30.0000 mL | Freq: Once | INTRAMUSCULAR | Status: AC
  Administered 2018-09-10: 30 mL

## 2018-09-10 NOTE — ED Provider Notes (Signed)
[facility] Emergency Department Provider Note   ____________________________________________   First MD Initiated Contact with Patient 09/10/18 2315     (approximate)  I have reviewed the triage vital signs and the nursing notes.   HISTORY  Chief Complaint Head Injury    HPI Sheryl Kennedy is a 73 y.o. female who presents to the ED from home status post mechanical fall with head injury.  Patient states she tripped on the rug with her sneakers and fell, striking the left side of her head against a dresser she thinks.  Denies LOC.  Takes a baby aspirin daily.  Presents with profuse bleeding from her wound.  Complains of mild headache.  Denies vision changes, neck pain, dizziness, chest pain, shortness of breath, abdominal pain, nausea or vomiting.       Past Medical History:  Diagnosis Date   Arthritis    Cancer ([place]) 08/2012   skin right anterior lower leg, squamous cell   Gout    Hx of seasonal allergies    Hyperlipidemia    Hypertension    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 complications     Patient Active Problem List   Diagnosis Date Noted   Mid sternal chest pain 06/06/2017   SOB (shortness of breath) 06/06/2017   Presence of left artificial knee joint 05/13/2016   Primary osteoarthritis of left knee 05/05/2016   Status post total right knee replacement 09/21/2014   Gout 07/16/2013   Hyperlipidemia, unspecified 07/16/2013   Inflammatory arthritis 07/16/2013   CKD (chronic kidney disease), stage III ([place]) 06/24/2013   Benign essential hypertension 05/02/2013   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 complications 09/62/8366   Breast screening, unspecified 09/17/2012   Leg ulcer ([place]) 09/17/2012    Past Surgical History:  Procedure Laterality Date   BACK SURGERY  2008   no metal in back   BREAST BIOPSY Right 09/13/2010   core/neg   BREAST BIOPSY Right 12/09/2015   US biopsy/neg   BREAST  SURGERY Right 2012   CHONDROPLASTY Left 01/22/2016   Procedure: arthroscopic medial AND LATERAL CHONDROPLASTY;  Surgeon: Dereck Leep, MD;  Location: [facility] ORS;  Service: Orthopedics;  Laterality: Left;   COLONOSCOPY  2011   COLONOSCOPY WITH PROPOFOL N/A 04/14/2015   Procedure: COLONOSCOPY WITH PROPOFOL;  Surgeon: Lollie Sails, MD;  Location: [facility] ENDOSCOPY;  Service: Endoscopy;  Laterality: N/A;   CYST REMOVAL HAND Left 2012   JOINT REPLACEMENT Right 09/30/2013   TOTAL KNEE REPLACEMENT, DR. Marry Guan, [place] ARTHROSCOPY WITH MEDIAL MENISECTOMY Left 01/22/2016   Procedure: KNEE ARTHROSCOPY WITH MEDIAL AND LATERAL MENISECTOMY;  Surgeon: Dereck Leep, MD;  Location: [facility] ORS;  Service: Orthopedics;  Laterality: Left;   KNEE SURGERY  1997   Stab pheblectomy  Left 2013   TONSILLECTOMY     VEIN CLOSURE Bilateral 2012    Prior to Admission medications   Medication Sig Start Date End Date Taking? Authorizing Provider  allopurinol (ZYLOPRIM) 300 MG tablet Take 300 mg by mouth daily.    [provider]  amoxicillin (AMOXIL) 500 MG capsule Take 1 capsule (500 mg total) by mouth 2 (two) times daily. 04/27/18   Edrick Kins, DPM  aspirin 81 MG tablet Take 81 mg by mouth daily. WILL STOP PRIOR TO PROCEDURE    [provider]  Calcium Carbonate-Vit D-Min (CALCIUM 600+D PLUS MINERALS PO) Take 1 tablet by mouth daily.    [provider]  Cholecalciferol (VITAMIN  D3) 25 MCG (1000 UT) CAPS Take by mouth.    [provider]  colchicine 0.6 MG tablet Take 1 tablet (0.6 mg total) by mouth daily. Patient taking differently: Take 0.6 mg by mouth daily as needed.  06/13/13   Hyatt, Max T, DPM  Cyanocobalamin (RA VITAMIN B-12 TR) 1000 MCG TBCR Take 1 tablet by mouth daily.     [provider]  cyclobenzaprine (FLEXERIL) 5 MG tablet Take 5 mg by mouth at bedtime.  05/09/14   [provider]  docusate sodium (COLACE) 100 MG capsule Take 100 mg by  mouth at bedtime.    [provider]  fexofenadine (ALLEGRA) 180 MG tablet Take 180 mg by mouth daily as needed for allergies.     [provider]  fluticasone (FLONASE) 50 MCG/ACT nasal spray Place 1 spray into both nostrils daily as needed for allergies or rhinitis.    [provider]  ibuprofen (ADVIL,MOTRIN) 800 MG tablet Take 1 tablet (800 mg total) by mouth every 8 (eight) hours as needed. 04/18/18   Edrick Kins, DPM  indomethacin (INDOCIN) 50 MG capsule Take 1 capsule (50 mg total) by mouth 2 (two) times daily with a meal. 05/16/13   Hyatt, Max T, DPM  levothyroxine (SYNTHROID) 50 MCG tablet Take 50 mcg by mouth daily before breakfast.    [provider]  metoprolol succinate (TOPROL-XL) 25 MG 24 hr tablet Take 25 mg by mouth daily.  04/26/13   [provider]  Nutritional Supplements (ESTROVEN PM PO) Take 0.5 tablets by mouth at bedtime. For hotflashes    [provider]  potassium chloride SA (K-DUR,KLOR-CON) 20 MEQ tablet Take 20 mEq by mouth 3 (three) times daily.  03/28/13   [provider]  simvastatin (ZOCOR) 40 MG tablet Take 20 mg by mouth daily at 6 PM.  04/08/13   [provider]  spironolactone-hydrochlorothiazide (ALDACTAZIDE) 25-25 MG tablet Take 2 tablets by mouth daily. 09/21/16   [provider]  torsemide (DEMADEX) 20 MG tablet Take 20 mg by mouth daily.  04/26/13   [provider]  traMADol (ULTRAM) 50 MG tablet Take 50 mg by mouth 4 (four) times daily.     [provider]  TURMERIC PO Take 1 tablet by mouth daily.    [provider]    Allergies Naproxen, Pseudoephedrine, Shellfish allergy, and Sulfa antibiotics  Family History  Problem Relation Age of Onset   Heart disease Mother    Breast cancer Neg Hx     Social History Social History   Tobacco Use   Smoking status: Never Smoker   Smokeless tobacco: Never Used  Substance Use Topics   Alcohol use:  Yes    Comment: occassional   Drug use: No    Review of Systems  Constitutional: No fever/chills Eyes: No visual changes. ENT: Positive for head injury and facial laceration.  No sore throat. Cardiovascular: Denies chest pain. Respiratory: Denies shortness of breath. Gastrointestinal: No abdominal pain.  No nausea, no vomiting.  No diarrhea.  No constipation. Genitourinary: Negative for dysuria. Musculoskeletal: Negative for back pain. Skin: Negative for rash. Neurological: Positive for headache.  Negative for focal weakness or numbness.   ____________________________________________   PHYSICAL EXAM:  VITAL SIGNS: ED Triage Vitals [09/10/18 2305]  Enc Vitals Group     BP 126/65     Pulse Rate 73     Resp 18     Temp 98 F (36.7 C)     Temp  Source Oral     SpO2 100 %     Weight      Height      Head Circumference      Peak Flow      Pain Score 3     Pain Loc      Pain Edu?      Excl. in [place]?     Constitutional: Alert and oriented. Well appearing and in mild acute distress. Eyes: Conjunctivae are normal. PERRL. EOMI. Developing hematoma over left eye. Head: Approximately 3 cm reverse L-shaped laceration to left temple with small arterial bleeding. Nose: Atraumatic. Mouth/Throat: Mucous membranes are moist.  No dental malocclusion. Neck: No stridor.  No cervical spine tenderness to palpation.  No step-offs or deformities noted. Cardiovascular: Normal rate, regular rhythm. Grossly normal heart sounds.  Good peripheral circulation. Respiratory: Normal respiratory effort.  No retractions. Lungs CTAB. Gastrointestinal: Soft and nontender. No distention. No abdominal bruits. No CVA tenderness. Musculoskeletal: No spinal tenderness to palpation.  No step-offs or deformities noted.  Pelvis is stable.  Full range of motion bilateral hips.  No lower extremity tenderness nor edema.  No joint effusions. Neurologic: Alert and oriented x3.  CN II-XII grossly intact.  Normal  speech and language. No gross focal neurologic deficits are appreciated.  Skin:  Skin is warm, dry and intact. No rash noted. Psychiatric: Mood and affect are normal. Speech and behavior are normal.  ____________________________________________   LABS (all labs ordered are listed, but only abnormal results are displayed)  Labs Reviewed  CBC WITH DIFFERENTIAL/PLATELET - Abnormal; Notable for the following components:      Result Value   WBC 11.4 (*)    Neutro Abs 8.3 (*)    Monocytes Absolute 1.2 (*)    Abs Immature Granulocytes 0.14 (*)    All other components within normal limits  BASIC METABOLIC PANEL - Abnormal; Notable for the following components:   Sodium 130 (*)    Potassium 3.2 (*)    Chloride 88 (*)    Glucose, Bld 125 (*)    BUN 48 (*)    Creatinine, Ser 1.69 (*)    GFR calc non Af Amer 30 (*)    GFR calc Af Amer 35 (*)    All other components within normal limits   ____________________________________________  EKG  ED ECG REPORT I, Acelyn Basham J, the attending physician, personally viewed and interpreted this ECG.   Date: 09/11/2018  EKG Time: 0125  Rate: 76  Rhythm: normal EKG, normal sinus rhythm  Axis: Normal  Intervals:none  ST&T Change: Nonspecific  ____________________________________________  RADIOLOGY  ED MD interpretation: CT head demonstrates 5 mm left SDH; cervical spine and maxillofacial no fracture or dislocation.  Repeat CT head demonstrates stable 5 mm left SDH.  Official radiology report(s): Ct Head Wo Contrast  Result Date: 09/11/2018 CLINICAL DATA:  Re-evaluation of subdural hemorrhage EXAM: CT HEAD WITHOUT CONTRAST TECHNIQUE: Contiguous axial images were obtained from the base of the skull through the vertex without intravenous contrast. COMPARISON:  09/11/2018 FINDINGS: Brain: No evidence of acute infarction, hydrocephalus, extra-axial collection or mass lesion/mass effect. Stable hyperdense extra-axial fluid collection along the left  frontotemporal lobes measuring 5 mm in thickness. Mild periventricular white matter low attenuation as can be seen with microvascular disease. Mild generalized cerebral atrophy. Vascular: No hyperdense vessel or unexpected calcification. Skull: No osseous abnormality. Sinuses/Orbits: Visualized paranasal sinuses are clear. Visualized mastoid sinuses are clear. Visualized orbits demonstrate no focal abnormality. Other: Mild left periorbital soft tissue  swelling. IMPRESSION: 1. Stable 5 mm left frontotemporal subdural hematoma. No midline shift. Electronically Signed   By: Kathreen Devoid   On: 09/11/2018 07:04   Ct Head Wo Contrast  Result Date: 09/11/2018 CLINICAL DATA:  Head trauma, headache which she is EXAM: CT HEAD WITHOUT CONTRAST CT MAXILLOFACIAL WITHOUT CONTRAST CT CERVICAL SPINE WITHOUT CONTRAST TECHNIQUE: Multidetector CT imaging of the head, cervical spine, and maxillofacial structures were performed using the standard protocol without intravenous contrast. Multiplanar CT image reconstructions of the cervical spine and maxillofacial structures were also generated. COMPARISON:  None. FINDINGS: CT HEAD FINDINGS Brain: There is a small subdural hemorrhage seen overlying the left frontotemporal lobe measuring 5 mm and transverse dimension. No midline shift is seen. There is mild dilatation the ventricles and sulci consistent with age-related atrophy. Gray-white differentiation is normal. Vascular: No hyperdense vessel or unexpected calcification. Skull: The skull is intact. No fracture or focal lesion identified. Sinuses/Orbits: The visualized paranasal sinuses and mastoid air cells are clear. The orbits and globes intact. Other: There is left periorbital soft tissue swelling and soft tissue swelling seen over the left frontal lobe. CT MAXILLOFACIAL FINDINGS Osseous: No acute fracture or other significant osseous abnormality.The nasal bone, mandibles, zygomatic arches and pterygoid plates are intact. Orbits:  No fracture identified. Unremarkable appearance of globes and orbits. Sinuses: The visualized paranasal sinuses and mastoid air cells are unremarkable. Soft tissues: There is left periorbital soft tissue swelling and soft tissue swelling seen over the left frontal lobe with area of superficial laceration and subcutaneous emphysema. Limited intracranial: No acute findings. CT CERVICAL SPINE FINDINGS Alignment: There is straightening of the normal cervical lordosis. A minimal anterolisthesis of C3 on C4 is seen measuring 2 mm. Skull base and vertebrae: Visualized skull base is intact. No atlanto-occipital dissociation. The vertebral body heights are well maintained. No fracture or pathologic osseous lesion seen. Soft tissues and spinal canal: The visualized paraspinal soft tissues are unremarkable. No prevertebral soft tissue swelling is seen. The spinal canal is grossly unremarkable, no large epidural collection or significant canal narrowing. Disc levels: Multilevel disc degenerative changes are seen most notable at C5-C6 and C6-C7 with disc osteophyte complex and uncovertebral osteophytes. Upper chest: The lung apices are clear. Thoracic inlet is within normal limits. Other: None IMPRESSION: 1. 5 mm subdural hemorrhage overlying the left frontotemporal lobe. 2. Left periorbital and frontal soft tissue swelling. 3. No acute fracture or malalignment of the cervical spine 4. No facial fracture. These results were called by telephone at the time of interpretation on 09/11/2018 at 12:37 am to Dr. Lurline Hare , who verbally acknowledged these results. Electronically Signed   By: Prudencio Pair M.D.   On: 09/11/2018 00:38   Ct Cervical Spine Wo Contrast  Result Date: 09/11/2018 CLINICAL DATA:  Head trauma, headache which she is EXAM: CT HEAD WITHOUT CONTRAST CT MAXILLOFACIAL WITHOUT CONTRAST CT CERVICAL SPINE WITHOUT CONTRAST TECHNIQUE: Multidetector CT imaging of the head, cervical spine, and maxillofacial structures were  performed using the standard protocol without intravenous contrast. Multiplanar CT image reconstructions of the cervical spine and maxillofacial structures were also generated. COMPARISON:  None. FINDINGS: CT HEAD FINDINGS Brain: There is a small subdural hemorrhage seen overlying the left frontotemporal lobe measuring 5 mm and transverse dimension. No midline shift is seen. There is mild dilatation the ventricles and sulci consistent with age-related atrophy. Gray-white differentiation is normal. Vascular: No hyperdense vessel or unexpected calcification. Skull: The skull is intact. No fracture or focal lesion identified. Sinuses/Orbits: The visualized  paranasal sinuses and mastoid air cells are clear. The orbits and globes intact. Other: There is left periorbital soft tissue swelling and soft tissue swelling seen over the left frontal lobe. CT MAXILLOFACIAL FINDINGS Osseous: No acute fracture or other significant osseous abnormality.The nasal bone, mandibles, zygomatic arches and pterygoid plates are intact. Orbits: No fracture identified. Unremarkable appearance of globes and orbits. Sinuses: The visualized paranasal sinuses and mastoid air cells are unremarkable. Soft tissues: There is left periorbital soft tissue swelling and soft tissue swelling seen over the left frontal lobe with area of superficial laceration and subcutaneous emphysema. Limited intracranial: No acute findings. CT CERVICAL SPINE FINDINGS Alignment: There is straightening of the normal cervical lordosis. A minimal anterolisthesis of C3 on C4 is seen measuring 2 mm. Skull base and vertebrae: Visualized skull base is intact. No atlanto-occipital dissociation. The vertebral body heights are well maintained. No fracture or pathologic osseous lesion seen. Soft tissues and spinal canal: The visualized paraspinal soft tissues are unremarkable. No prevertebral soft tissue swelling is seen. The spinal canal is grossly unremarkable, no large epidural  collection or significant canal narrowing. Disc levels: Multilevel disc degenerative changes are seen most notable at C5-C6 and C6-C7 with disc osteophyte complex and uncovertebral osteophytes. Upper chest: The lung apices are clear. Thoracic inlet is within normal limits. Other: None IMPRESSION: 1. 5 mm subdural hemorrhage overlying the left frontotemporal lobe. 2. Left periorbital and frontal soft tissue swelling. 3. No acute fracture or malalignment of the cervical spine 4. No facial fracture. These results were called by telephone at the time of interpretation on 09/11/2018 at 12:37 am to Dr. Lurline Hare , who verbally acknowledged these results. Electronically Signed   By: Prudencio Pair M.D.   On: 09/11/2018 00:38   Ct Maxillofacial Wo Cm  Result Date: 09/11/2018 CLINICAL DATA:  Head trauma, headache which she is EXAM: CT HEAD WITHOUT CONTRAST CT MAXILLOFACIAL WITHOUT CONTRAST CT CERVICAL SPINE WITHOUT CONTRAST TECHNIQUE: Multidetector CT imaging of the head, cervical spine, and maxillofacial structures were performed using the standard protocol without intravenous contrast. Multiplanar CT image reconstructions of the cervical spine and maxillofacial structures were also generated. COMPARISON:  None. FINDINGS: CT HEAD FINDINGS Brain: There is a small subdural hemorrhage seen overlying the left frontotemporal lobe measuring 5 mm and transverse dimension. No midline shift is seen. There is mild dilatation the ventricles and sulci consistent with age-related atrophy. Gray-white differentiation is normal. Vascular: No hyperdense vessel or unexpected calcification. Skull: The skull is intact. No fracture or focal lesion identified. Sinuses/Orbits: The visualized paranasal sinuses and mastoid air cells are clear. The orbits and globes intact. Other: There is left periorbital soft tissue swelling and soft tissue swelling seen over the left frontal lobe. CT MAXILLOFACIAL FINDINGS Osseous: No acute fracture or other  significant osseous abnormality.The nasal bone, mandibles, zygomatic arches and pterygoid plates are intact. Orbits: No fracture identified. Unremarkable appearance of globes and orbits. Sinuses: The visualized paranasal sinuses and mastoid air cells are unremarkable. Soft tissues: There is left periorbital soft tissue swelling and soft tissue swelling seen over the left frontal lobe with area of superficial laceration and subcutaneous emphysema. Limited intracranial: No acute findings. CT CERVICAL SPINE FINDINGS Alignment: There is straightening of the normal cervical lordosis. A minimal anterolisthesis of C3 on C4 is seen measuring 2 mm. Skull base and vertebrae: Visualized skull base is intact. No atlanto-occipital dissociation. The vertebral body heights are well maintained. No fracture or pathologic osseous lesion seen. Soft tissues and spinal canal: The  visualized paraspinal soft tissues are unremarkable. No prevertebral soft tissue swelling is seen. The spinal canal is grossly unremarkable, no large epidural collection or significant canal narrowing. Disc levels: Multilevel disc degenerative changes are seen most notable at C5-C6 and C6-C7 with disc osteophyte complex and uncovertebral osteophytes. Upper chest: The lung apices are clear. Thoracic inlet is within normal limits. Other: None IMPRESSION: 1. 5 mm subdural hemorrhage overlying the left frontotemporal lobe. 2. Left periorbital and frontal soft tissue swelling. 3. No acute fracture or malalignment of the cervical spine 4. No facial fracture. These results were called by telephone at the time of interpretation on 09/11/2018 at 12:37 am to Dr. Lurline Hare , who verbally acknowledged these results. Electronically Signed   By: Prudencio Pair M.D.   On: 09/11/2018 00:38    ____________________________________________   PROCEDURES  Procedure(s) performed (including Critical Care):  Marland KitchenMarland KitchenLaceration Repair  Date/Time: 09/10/2018 11:55 PM Performed by:  Paulette Blanch, MD Authorized by: Paulette Blanch, MD   Consent:    Consent obtained:  Verbal   Consent given by:  Patient   Risks discussed:  Infection, pain, poor cosmetic result and poor wound healing Anesthesia (see MAR for exact dosages):    Anesthesia method:  Local infiltration   Local anesthetic:  Lidocaine 1% WITH epi Laceration details:    Location:  Face   Facial location: left temple.   Length (cm):  3   Depth (mm):  5 Repair type:    Repair type:  Intermediate Pre-procedure details:    Preparation:  Patient was prepped and draped in usual sterile fashion Exploration:    Hemostasis achieved with:  Direct pressure   Wound exploration: entire depth of wound probed and visualized     Contaminated: no   Treatment:    Area cleansed with:  Saline   Amount of cleaning:  Extensive   Irrigation solution:  Sterile saline   Irrigation method:  Pressure wash   Visualized foreign bodies/material removed: no   Skin repair:    Repair method:  Sutures   Suture size:  4-0   Wound skin closure material used: Vicryl.   Suture technique:  Simple interrupted   Number of sutures:  4 Approximation:    Approximation:  Close Post-procedure details:    Dressing:  Bulky dressing   Patient tolerance of procedure:  Tolerated well, no immediate complications Comments:     Overlying skin repaired with 4-0 Nylon, running sutures     ____________________________________________   INITIAL IMPRESSION / ASSESSMENT AND PLAN / ED COURSE  As part of my medical decision making, I reviewed the following data within the [place] History obtained from family, Nursing notes reviewed and incorporated, Old chart reviewed, Radiograph reviewed and Notes from prior ED visits     Sheryl Kennedy was evaluated in Emergency Department on 09/11/2018 for the symptoms described in the history of present illness. She was evaluated in the context of the global COVID-19 pandemic, which  necessitated consideration that the patient might be at risk for infection with the SARS-CoV-2 virus that causes COVID-19. Institutional protocols and algorithms that pertain to the evaluation of patients at risk for COVID-19 are in a state of rapid change based on information released by regulatory bodies including the CDC and federal and state organizations. These policies and algorithms were followed during the patient's care in the ED.    73 year old female who presents with head injury status post mechanical fall.  Differential diagnosis includes but  is not limited to [place], traumatic SAH, cervical spine injury, facial fracture.  Laceration repair done immediately upon patient's arrival to the treatment room.  Patient tolerated sutures well.  Will obtain CT head/C-spine/maxillofacial.  Will obtain basic lab work.  Clinical Course as of Sep 11 719  Mon Sep 10, 2018  2358 Surgicel was placed over wound, pressure dressing and ice applied.  Will reexamine after patient returns from CT scan.   [JS]  Tue Sep 11, 2018  0040 Discussed CT head with radiology concerning small 5 mm left-sided subdural hematoma without midline shift.  CT C-spine and maxillofacial unremarkable.  Discussed case with neurosurgery on-call Dr. Lacinda Axon who agrees with 6-hour observation and repeat CT head.  He does recommend holding baby aspirin for 1 week.  Updated patient and spouse were agreeable to plan of care.   [JS]  0107 More bruising noted to left eye area. Head reclined and new ice pack applied. No bleeding through dressing.   [JS]  4982 Repeat CT head reveals stable 5 mm subdural hematoma.  Patient remains neurologically intact without focal deficits.  Wound reexamined; no active bleeding; some serosanguineous oozing.  She will hold her aspirin x7 days.  Suture removal in 5 days.  Follow-up with neurosurgery.  Strict return precautions given.  Patient and spouse verbalized understanding agree with plan of care.   [JS]      Clinical Course User Index [JS] Paulette Blanch, MD     ____________________________________________   FINAL CLINICAL IMPRESSION(S) / ED DIAGNOSES  Final diagnoses:  Injury of head, initial encounter  Fall, initial encounter  Facial laceration, initial encounter  Facial contusion, initial encounter  Subdural hematoma [facility])     ED Discharge Orders    None       Note:  This document was prepared using Dragon voice recognition software and may include unintentional dictation errors.   Paulette Blanch, MD 09/11/18 [phone]

## 2018-09-10 NOTE — ED Triage Notes (Addendum)
Pt to triage via w/c, holding left side of head which is bleeding profusely; pt reports that she tripped and fell approx hr ago; denies LOC but st some HA

## 2018-09-11 ENCOUNTER — Emergency Department: Payer: Medicare HMO

## 2018-09-11 ENCOUNTER — Other Ambulatory Visit: Payer: Self-pay

## 2018-09-11 LAB — CBC WITH DIFFERENTIAL/PLATELET
Abs Immature Granulocytes: 0.14 10*3/uL — ABNORMAL HIGH (ref 0.00–0.07)
Basophils Absolute: 0.1 10*3/uL (ref 0.0–0.1)
Basophils Relative: 1 %
Eosinophils Absolute: 0.4 10*3/uL (ref 0.0–0.5)
Eosinophils Relative: 4 %
HCT: 36.1 % (ref 36.0–46.0)
Hemoglobin: 12.6 g/dL (ref 12.0–15.0)
Immature Granulocytes: 1 %
Lymphocytes Relative: 12 %
Lymphs Abs: 1.3 10*3/uL (ref 0.7–4.0)
MCH: 30.8 pg (ref 26.0–34.0)
MCHC: 34.9 g/dL (ref 30.0–36.0)
MCV: 88.3 fL (ref 80.0–100.0)
Monocytes Absolute: 1.2 10*3/uL — ABNORMAL HIGH (ref 0.1–1.0)
Monocytes Relative: 11 %
Neutro Abs: 8.3 10*3/uL — ABNORMAL HIGH (ref 1.7–7.7)
Neutrophils Relative %: 71 %
Platelets: 240 10*3/uL (ref 150–400)
RBC: 4.09 MIL/uL (ref 3.87–5.11)
RDW: 14.6 % (ref 11.5–15.5)
WBC: 11.4 10*3/uL — ABNORMAL HIGH (ref 4.0–10.5)
nRBC: 0 % (ref 0.0–0.2)

## 2018-09-11 LAB — BASIC METABOLIC PANEL
Anion gap: 12 (ref 5–15)
BUN: 48 mg/dL — ABNORMAL HIGH (ref 8–23)
CO2: 30 mmol/L (ref 22–32)
Calcium: 9.8 mg/dL (ref 8.9–10.3)
Chloride: 88 mmol/L — ABNORMAL LOW (ref 98–111)
Creatinine, Ser: 1.69 mg/dL — ABNORMAL HIGH (ref 0.44–1.00)
GFR calc Af Amer: 35 mL/min — ABNORMAL LOW (ref >60)
GFR calc non Af Amer: 30 mL/min — ABNORMAL LOW (ref >60)
Glucose, Bld: 125 mg/dL — ABNORMAL HIGH (ref 70–99)
Potassium: 3.2 mmol/L — ABNORMAL LOW (ref 3.5–5.1)
Sodium: 130 mmol/L — ABNORMAL LOW (ref 135–145)

## 2018-09-11 MED ORDER — ACETAMINOPHEN 500 MG PO TABS
1000.0000 mg | ORAL_TABLET | Freq: Once | ORAL | Status: AC
  Administered 2018-09-11: 1000 mg via ORAL
  Filled 2018-09-11: qty 2

## 2018-09-11 NOTE — ED Notes (Signed)
Patient transported to CT 

## 2018-09-11 NOTE — Discharge Instructions (Addendum)
1.  Do not take your aspirin for 1 week. 2.  Suture removal in 5 days. 3.  Apply ice to area of swelling several times daily to reduce swelling. 4.  Return to the ER for worsening symptoms, persistent vomiting, lethargy or other concerns.

## 2018-09-11 NOTE — ED Notes (Signed)
Pt resting quietly in room, respirations equal and unlabored, husband at bedside. Room darkened for comfort. Pt waiting for repeat CT at 640am.

## 2018-10-20 IMAGING — MG MM DIGITAL SCREENING BILAT W/ CAD
4 series · 4 of 4 positions shown · non-contrast
Comparison: Previous exam(s).

CLINICAL DATA: Screening.

EXAM:
DIGITAL SCREENING BILATERAL MAMMOGRAM WITH CAD

[R MLO]
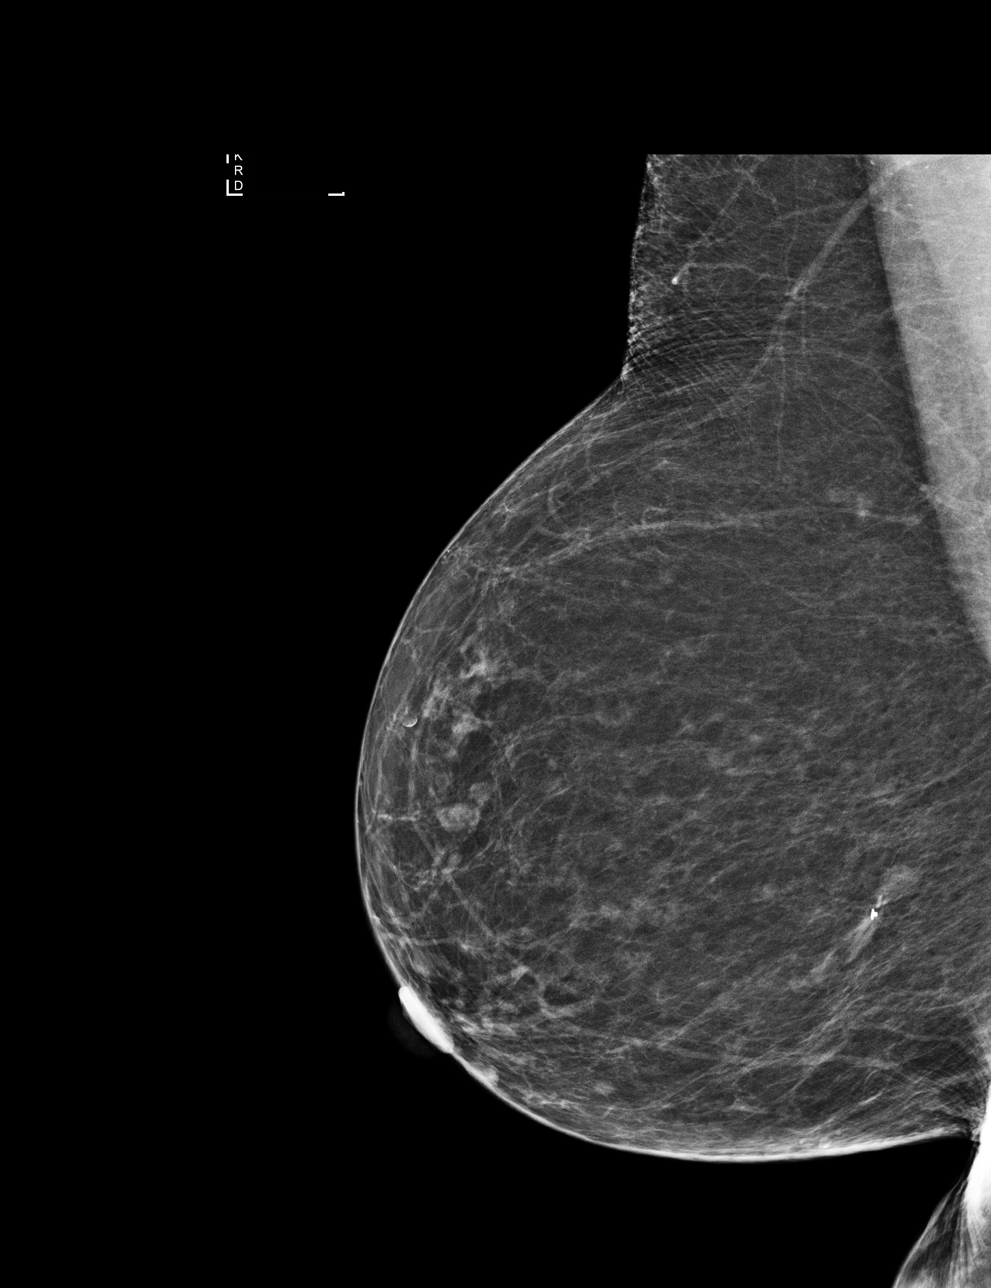

[L CC]
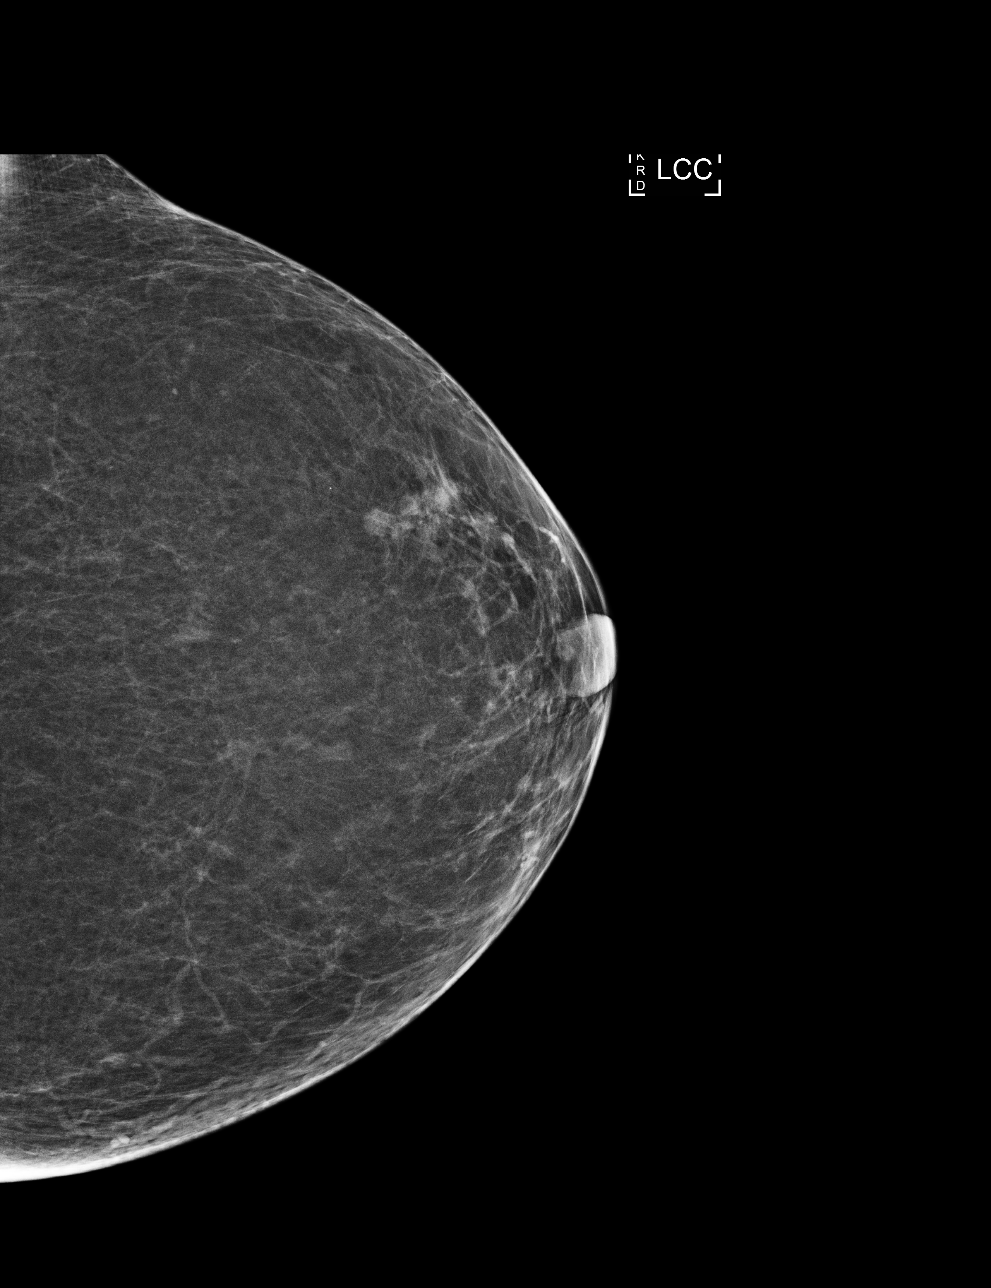

[L MLO]
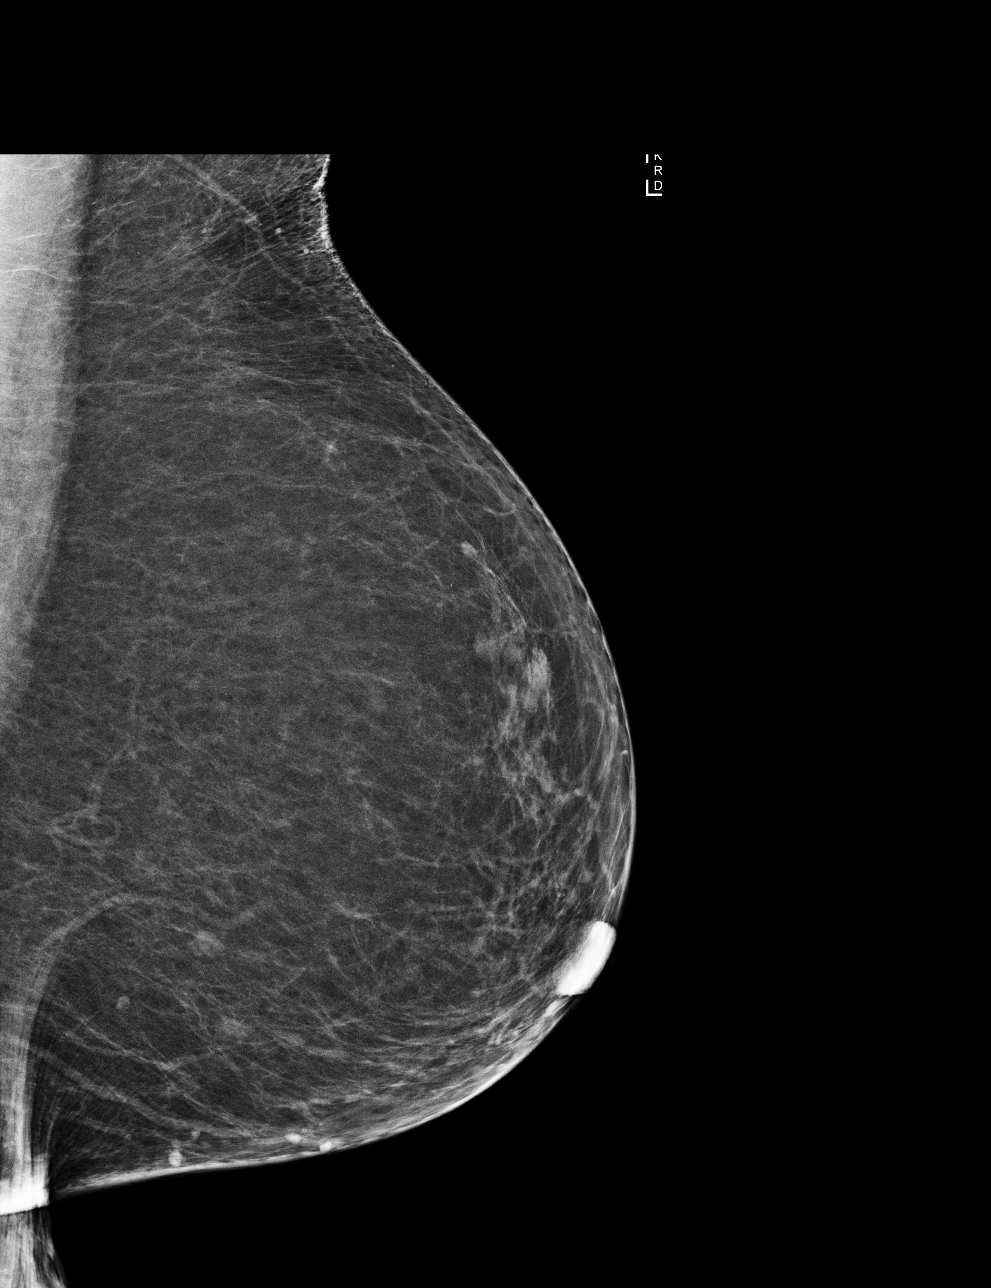

[R CC]
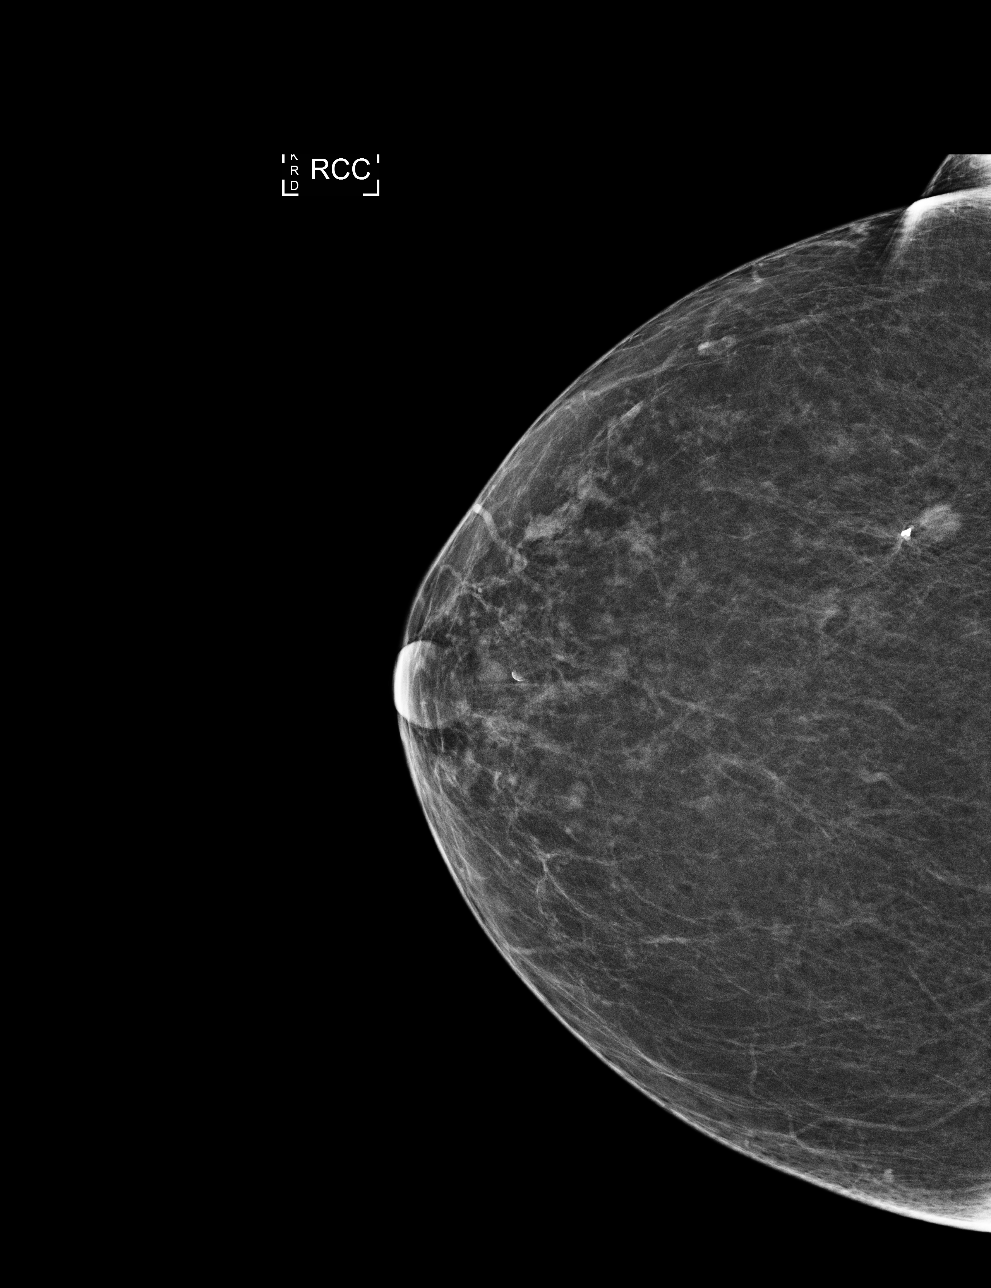

[4 of 4 positions shown; findings below may reference images not displayed]

ACR Breast Density Category b: There are scattered areas of
fibroglandular density.
FINDINGS: In the right breast, a possible mass warrants further evaluation. In
the left breast, no findings suspicious for malignancy. Images were
processed with CAD.
IMPRESSION: Further evaluation is suggested for possible mass in the right
breast.

RECOMMENDATION:
Diagnostic mammogram and possibly ultrasound of the right breast.
(Code:[id number])

The patient will be contacted regarding the findings, and additional
imaging will be scheduled.

BI-RADS CATEGORY  0: Incomplete. Need additional imaging evaluation
and/or prior mammograms for comparison.

## 2018-11-20 ENCOUNTER — Other Ambulatory Visit: Payer: Self-pay | Admitting: Internal Medicine

## 2018-11-20 DIAGNOSIS — Z1231 Encounter for screening mammogram for malignant neoplasm of breast: Secondary | ICD-10-CM

## 2018-11-30 IMAGING — MG US  BREAST BX W/ LOC DEV 1ST LESION IMG BX SPEC US GUIDE*R*
1 series · 8 of 8 positions shown · non-contrast
Comparison: Previous exam(s).

ADDENDUM:
Pathology of the right breast biopsy revealed PSEUDOANGIOMATOUS
STROMAL HYPERPLASIA (PASH). COLUMNAR CELL CHANGE. NEGATIVE FOR
ATYPIA AND MALIGNANCY. This was found to be concordant by Dr. Rutten.

Recommendation: Return to routine screening mammography in October 2016.
At the patient's request, pathology and recommendations were relayed
to the patient by phone. She stated she has done well following the
biopsy. Post biopsy instructions were reviewed with the patient. All
of her questions were answered. She was encouraged to call the
[HOSPITAL] or Ahanmadh, Lukmah ([REDACTED])
with any further questions or concerns.
Pathology and recommendations relayed by Ahanmadh, Lukmah on
12/11/15.
CLINICAL DATA: 70-year-old female for ultrasound-guided biopsy of
an indeterminate right breast mass at [DATE], 8 cm from the nipple
EXAM:
ULTRASOUND GUIDED RIGHT BREAST CORE NEEDLE BIOPSY

[Series 1: MG view · 0.04mm/px · 8 of 13 slices shown]
[im 1/13]
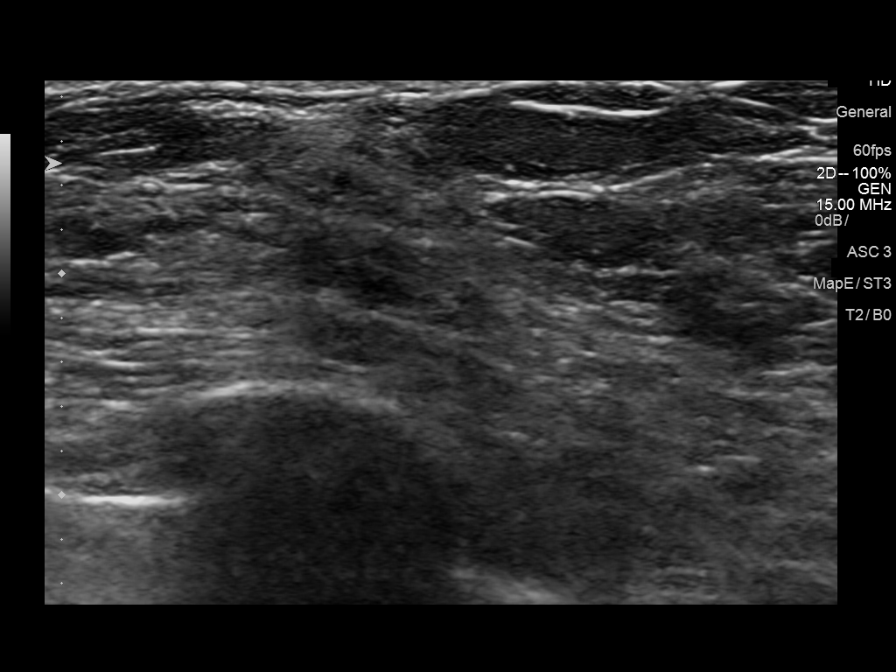
[im 2/13]
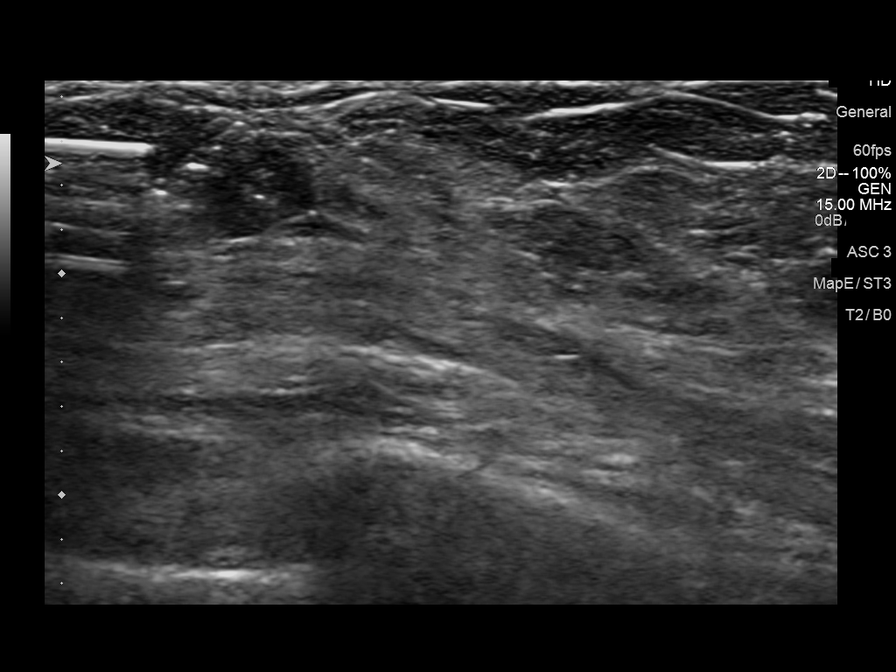
[im 4/13]
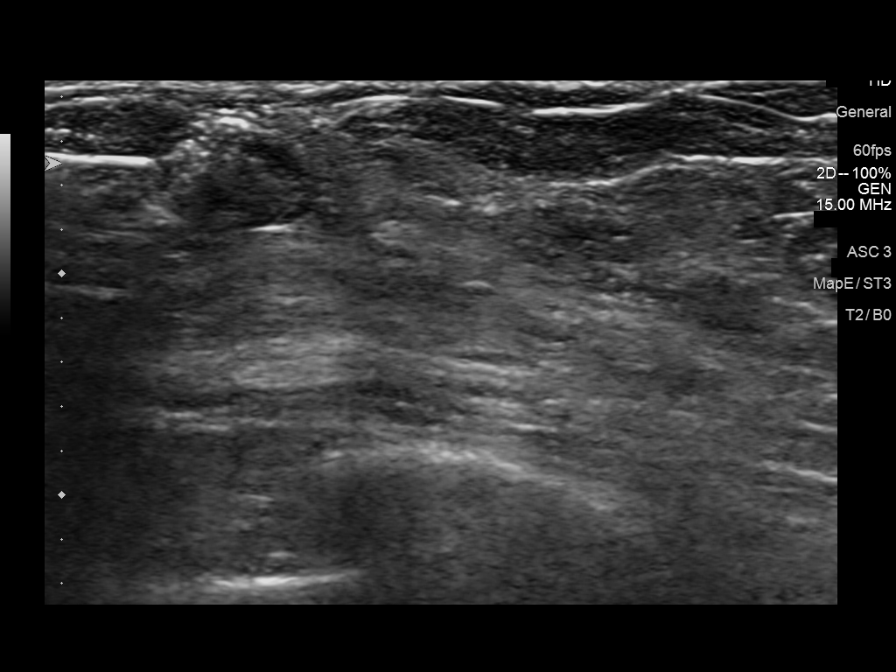
[im 6/13]
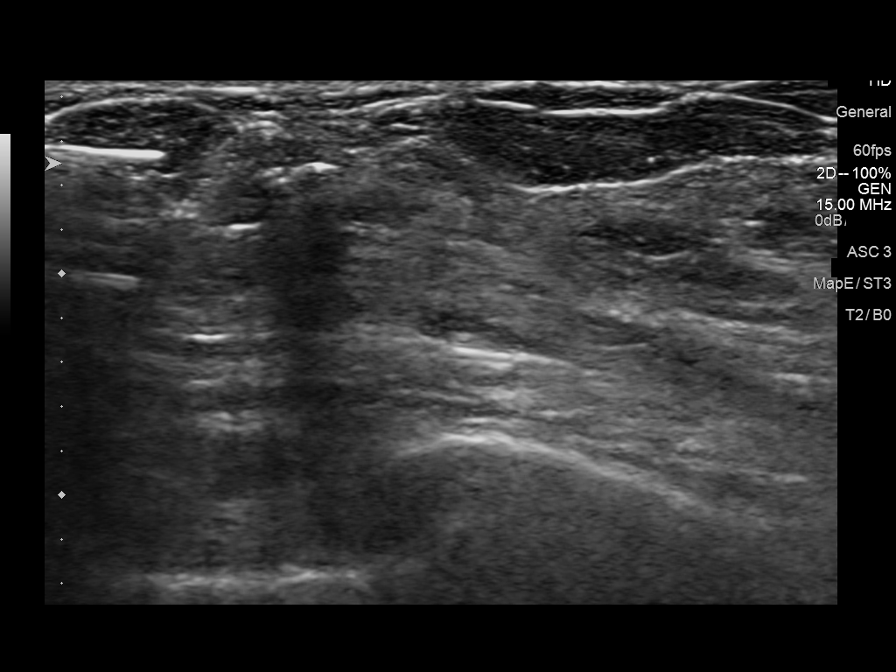
[im 7/13]
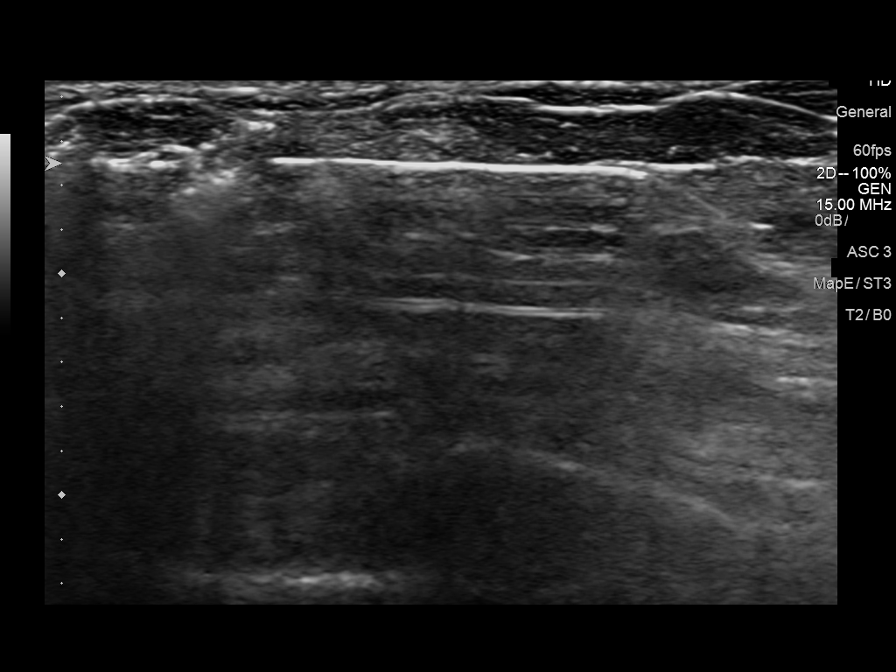
[im 9/13]
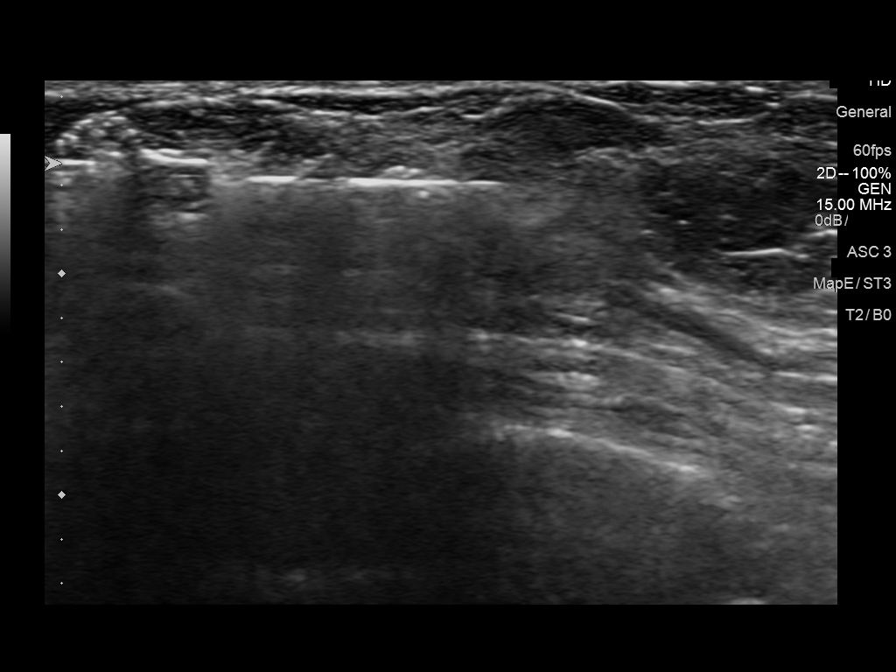
[im 11/13]
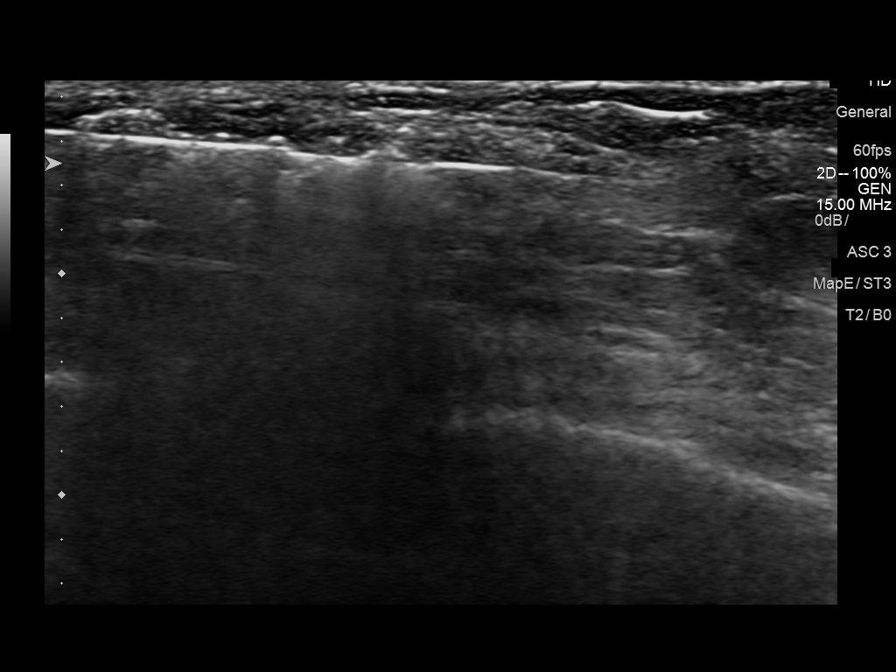
[im 13/13]
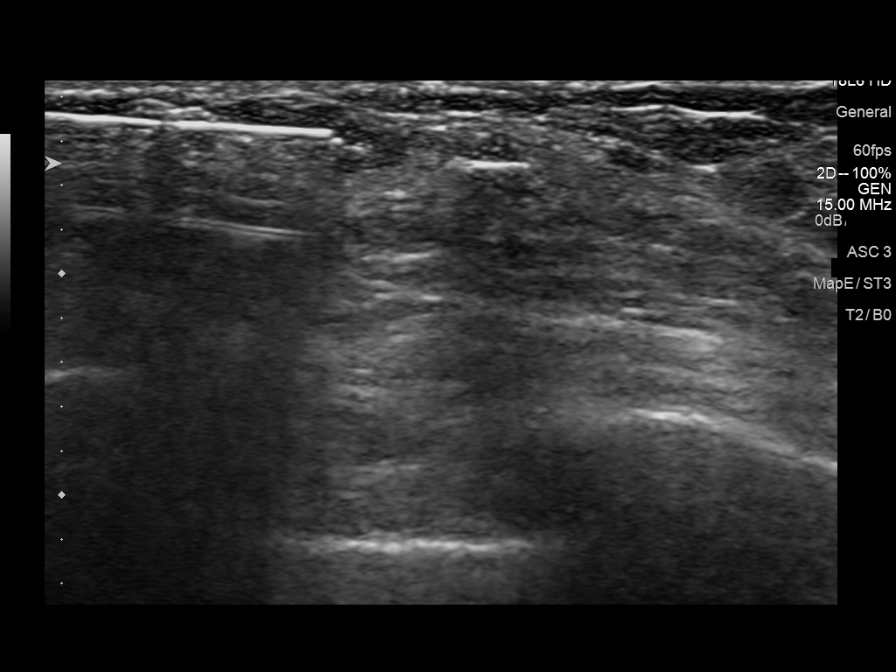

[8 of 8 positions shown; findings below may reference images not displayed]



Using sterile technique and 1% Lidocaine as local anesthetic, under
direct ultrasound visualization, a 12 gauge [place] device was
used to perform biopsy of a right breast mass at [DATE], 8 cm from the
nipple using a lateral to medial approach. At the conclusion of the
procedure a HydroMARK shape 3 coil tissue marker clip was deployed
into the biopsy cavity. Follow up 2 view mammogram was performed and
dictated separately.
IMPRESSION: Ultrasound guided biopsy of a right breast mass at [DATE], 8 cm from
the nipple. No apparent complications.

## 2019-02-12 ENCOUNTER — Ambulatory Visit
Admission: RE | Admit: 2019-02-12 | Discharge: 2019-02-12 | Disposition: A | Discharge status: Home / Self Care | Payer: Medicare HMO | Source: Ambulatory Visit | Attending: Internal Medicine | Admitting: Internal Medicine

## 2019-02-12 DIAGNOSIS — Z1231 Encounter for screening mammogram for malignant neoplasm of breast: Secondary | ICD-10-CM | POA: Diagnosis not present

## 2019-05-03 ENCOUNTER — Ambulatory Visit: Payer: Medicare HMO | Admitting: Dermatology

## 2019-05-03 ENCOUNTER — Other Ambulatory Visit: Payer: Self-pay

## 2019-05-03 DIAGNOSIS — L820 Inflamed seborrheic keratosis: Secondary | ICD-10-CM | POA: Diagnosis not present

## 2019-05-03 DIAGNOSIS — L578 Other skin changes due to chronic exposure to nonionizing radiation: Secondary | ICD-10-CM

## 2019-05-03 DIAGNOSIS — L565 Disseminated superficial actinic porokeratosis (DSAP): Secondary | ICD-10-CM | POA: Diagnosis not present

## 2019-05-03 NOTE — Progress Notes (Signed)
   Follow-Up Visit   Subjective  Sheryl Kennedy is a 74 y.o. female who presents for the following: lesions (B/L leg - has been there for a month or two, irregular appearing, patient concerned and would like checked). Raised and scaly.  Spots on legs that were previously frozen 6 months ago have cleared per patient.  The following portions of the chart were reviewed this encounter and updated as appropriate:     Review of Systems:  No other skin or systemic complaints except as noted in HPI or Assessment and Plan.  Objective  Well appearing patient in no apparent distress; mood and affect are within normal limits.  A focused examination was performed including B/L leg. Relevant physical exam findings are noted in the Assessment and Plan.  Objective  B/L leg: Pink brown scaly macules some with keratotic rim  Objective  Right Lower Leg x 5, L lower leg x 1 (6): Erythematous keratotic waxy stuck-on papule or plaque.    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  Pt will apply to one leg to note if effective. Recommend daily broad spectrum sunscreen SPF 30+ to sun-exposed areas, reapply every 2 hours as needed. Call for new or changing lesions.   Instructions for [facility] Medications  One or more of your medications was sent to the [facility] mail order compounding pharmacy. You will receive an email from them and can purchase the medicine through that link. It will then be mailed to your home at the address you confirmed. If for any reason you do not receive an email from them, please check your spam folder. If you still do not find the email, please let us know.    Inflamed seborrheic keratosis (6) Right Lower Leg x 5, L lower leg x 1  Vrs HyAK  Destruction of lesion - Right Lower Leg x 5, L lower leg x 1  Destruction method: cryotherapy   Informed  consent: discussed and consent obtained   Lesion destroyed using liquid nitrogen: Yes   Region frozen until ice ball extended beyond lesion: Yes   Outcome: patient tolerated procedure well with no complications   Post-procedure details: wound care instructions given     Actinic Damage - diffuse scaly erythematous macules with underlying dyspigmentation - Recommend daily broad spectrum sunscreen SPF 30+ to sun-exposed areas, reapply every 2 hours as needed.  - Call for new or changing lesions. - Samples given of Elta MD Elements (tinted)  Return in about 3 months (around 08/02/2019) for recheck legs, DSAP.  Luther Redo, CMA, am acting as scribe for Brendolyn Patty, MD .   Documentation: I have reviewed the above documentation for accuracy and completeness, and I agree with the above.  Brendolyn Patty, MD

## 2019-08-26 ENCOUNTER — Ambulatory Visit: Payer: Medicare HMO | Admitting: Dermatology

## 2019-08-26 ENCOUNTER — Other Ambulatory Visit: Payer: Self-pay

## 2019-08-26 DIAGNOSIS — L821 Other seborrheic keratosis: Secondary | ICD-10-CM | POA: Diagnosis not present

## 2019-08-26 DIAGNOSIS — L578 Other skin changes due to chronic exposure to nonionizing radiation: Secondary | ICD-10-CM | POA: Diagnosis not present

## 2019-08-26 DIAGNOSIS — L820 Inflamed seborrheic keratosis: Secondary | ICD-10-CM | POA: Diagnosis not present

## 2019-08-26 DIAGNOSIS — L565 Disseminated superficial actinic porokeratosis (DSAP): Secondary | ICD-10-CM

## 2019-08-26 NOTE — Progress Notes (Signed)
   Follow-Up Visit   Subjective  Sheryl Kennedy is a 74 y.o. female who presents for the following: DSAP (lower legs. Patient is using 2% Cholesterol/Lovastatin Cream QD/BID, which she thinks is helping.) and ISKs vs Hypertrophic AKs (lower legs, treated with LN2.). Patient has had PDT treatment to lower legs in the past, but was not tolerable for patient.  Very painful and could only do it for a few minutes.   The following portions of the chart were reviewed this encounter and updated as appropriate:      Review of Systems:  No other skin or systemic complaints except as noted in HPI or Assessment and Plan.  Objective  Well appearing patient in no apparent distress; mood and affect are within normal limits.  A focused examination was performed including lower legs, arms. Relevant physical exam findings are noted in the Assessment and Plan.  Objective  Lower Legs, Arms: Pink brown scaly macules some with keratotic rim.  Objective  Left Posterior Ankle x 2, L Calf x 2, Right  x Pretibia x 3, Right Calf x 1 (8): Erythematous keratotic or waxy stuck-on papule or plaque.    Assessment & Plan   Actinic Damage - diffuse scaly erythematous macules with underlying dyspigmentation - Recommend daily broad spectrum sunscreen SPF 30+ to sun-exposed areas, reapply every 2 hours as needed.  - Call for new or changing lesions.  Seborrheic Keratoses - Stuck-on, waxy, tan-brown papules and plaques  - Discussed benign etiology and prognosis. - Observe - Call for any changes  DSAP (disseminated superficial actinic porokeratosis) Lower Legs, Arms  Improving. Reviewed chronic condition, worsened with sun exposure. Continue 2% Cholesterol/Lovastatin Cream QD/BID lower legs dsp 240g 4Rf. Sent to [facility].  Recommend daily broad spectrum sunscreen SPF 30+ to sun-exposed areas, reapply every 2 hours as needed. Call for new or changing lesions. Wear long pants when outdoors.  Recommend  moisturizing cream 1-2 times daily.  Samples of AmLactin Rapid Dry and Ultra Smoothing.   Inflamed seborrheic keratosis (8) Left Posterior Ankle x 2, L Calf x 2, Right  x Pretibia x 3, Right Calf x 1  vs Hypertrophic AKs  Destruction of lesion - Left Posterior Ankle x 2, L Calf x 2, Right  x Pretibia x 3, Right Calf x 1  Destruction method: cryotherapy   Informed consent: discussed and consent obtained   Lesion destroyed using liquid nitrogen: Yes   Region frozen until ice ball extended beyond lesion: Yes   Outcome: patient tolerated procedure well with no complications   Post-procedure details: wound care instructions given    Return in 3 months (on 11/26/2019) for DSAP, AKs.   IJamesetta Orleans, CMA, am acting as scribe for Brendolyn Patty, MD .  Documentation: I have reviewed the above documentation for accuracy and completeness, and I agree with the above.  Brendolyn Patty MD

## 2019-08-26 NOTE — Patient Instructions (Addendum)
Cryotherapy Aftercare  . Wash gently with soap and water everyday.   Marland Kitchen Apply Vaseline and Band-Aid daily until healed.   Recommend daily broad spectrum sunscreen SPF 30+ to sun-exposed areas, reapply every 2 hours as needed. Call for new or changing lesions.  AmLactin Cream - Apply to arms and lower legs at night.    Instructions for [facility] Medications  One or more of your medications was sent to the [facility] mail order compounding pharmacy. You will receive an email from them and can purchase the medicine through that link. It will then be mailed to your home at the address you confirmed. If for any reason you do not receive an email from them, please check your spam folder. If you still do not find the email, please let us know. [phone]

## 2019-12-04 ENCOUNTER — Other Ambulatory Visit: Payer: Self-pay | Admitting: Internal Medicine

## 2019-12-04 DIAGNOSIS — Z1231 Encounter for screening mammogram for malignant neoplasm of breast: Secondary | ICD-10-CM

## 2019-12-09 ENCOUNTER — Other Ambulatory Visit: Payer: Self-pay

## 2019-12-09 ENCOUNTER — Ambulatory Visit: Payer: Medicare HMO | Admitting: Dermatology

## 2019-12-09 DIAGNOSIS — L578 Other skin changes due to chronic exposure to nonionizing radiation: Secondary | ICD-10-CM | POA: Diagnosis not present

## 2019-12-09 DIAGNOSIS — L820 Inflamed seborrheic keratosis: Secondary | ICD-10-CM

## 2019-12-09 DIAGNOSIS — L565 Disseminated superficial actinic porokeratosis (DSAP): Secondary | ICD-10-CM

## 2019-12-09 MED ORDER — MUPIROCIN 2 % EX OINT: TOPICAL_OINTMENT | CUTANEOUS | 2 refills | Status: DC

## 2019-12-09 NOTE — Patient Instructions (Signed)
Cryotherapy Aftercare  . Wash gently with soap and water everyday.   . Apply Vaseline and Band-Aid daily until healed.  

## 2019-12-09 NOTE — Progress Notes (Signed)
Follow-Up Visit   Subjective  Sheryl Kennedy is a 74 y.o. female who presents for the following: DSAP (lower legs, using 2% Cholesterol/Lovastatin Cream but she doesn't think it's helping much). She does have a few thick spots on her legs.   The following portions of the chart were reviewed this encounter and updated as appropriate:      Review of Systems:  No other skin or systemic complaints except as noted in HPI or Assessment and Plan.  Objective  Well appearing patient in no apparent distress; mood and affect are within normal limits.  A focused examination was performed including lower legs. Relevant physical exam findings are noted in the Assessment and Plan.  Objective  Lower legs: Pink brown scaly macules some with keratotic rim.  Objective  Left Lateral Ankle: 6.58mm slightly firm pink waxy papule, no scale  Right Lateral Lower Leg x 2 (2): Erythematous keratotic or waxy stuck-on papule   Assessment & Plan    Actinic Damage - Severe, chronic, secondary to cumulative UV radiation exposure over time - diffuse scaly erythematous macules and papules with underlying dyspigmentation - Discussed "Field Treatment" for Severe, Confluent Actinic Changes with Pre-Cancerous Actinic Keratoses Field treatment involves treatment of an entire area of skin that has confluent Actinic Changes (Sun/ Ultraviolet light damage) and PreCancerous Actinic Keratoses by method of PhotoDynamic Therapy (PDT) and/or prescription Topical Chemotherapy agents such as 5-fluorouracil, 5-fluorouracil/calcipotriene, and/or imiquimod.  The purpose is to decrease the number of clinically evident and subclinical PreCancerous lesions to prevent progression to development of skin cancer by chemically destroying early precancer changes that may or may not be visible.  It has been shown to reduce the risk of developing skin cancer in the treated area. As a result of treatment, redness, scaling, crusting, and open  sores may occur during treatment course. One or more than one of these methods may be used and may have to be used several times to control, suppress and eliminate the PreCancerous changes. Discussed treatment course, expected reaction, and possible side effects. - Recommend daily broad spectrum sunscreen SPF 30+ to sun-exposed areas, reapply every 2 hours as needed.  - Call for new or changing lesions.   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 Cont 2% Cholesterol/Lovastatin Cream QD/BID lower legs.   Recommend daily broad spectrum sunscreen SPF 30+ to sun-exposed areas, reapply every 2 hours as needed. Call for new or changing lesions. Wear long pants when outdoors.  Discussed PDT treatment to lower legs with no incubation, treatment under light x 30 minutes. Will schedule one appt. to see how she responds and if better tolerated (less pain)  Also discussed 5FU/VitD cream  Inflamed seborrheic keratosis (3) Left Lateral Ankle; Right Lateral Lower Leg x 2 (2)  Start Halog Ointment apply BID to AA left lateral ankle- sample given.    Destruction of lesion - Right Lateral Lower Leg x 2  Destruction method: cryotherapy   Informed consent: discussed and consent obtained   Lesion destroyed using liquid nitrogen: Yes   Region frozen until ice ball extended beyond lesion: Yes   Outcome: patient tolerated procedure well with no complications   Post-procedure details: wound care instructions given    Ordered Medications: mupirocin ointment (BACTROBAN) 2 %  Return in about 6 weeks (around 01/20/2020) for PDT to lower legs. Treat x 30 mins, no incubation. Follow-up with Dr Chauncey Cruel 1 month after treatment.Lindi Adie, CMA, am acting as scribe  for Brendolyn Patty, MD .  Documentation: I have reviewed the above documentation for accuracy and completeness, and I agree with the above.  Brendolyn Patty MD

## 2020-01-22 ENCOUNTER — Other Ambulatory Visit: Payer: Self-pay

## 2020-01-22 ENCOUNTER — Ambulatory Visit: Payer: Medicare HMO

## 2020-01-22 DIAGNOSIS — L570 Actinic keratosis: Secondary | ICD-10-CM | POA: Diagnosis not present

## 2020-01-22 MED ORDER — AMINOLEVULINIC ACID HCL 20 % EX SOLR
2.0000 "application " | Freq: Once | CUTANEOUS | Status: AC
  Administered 2020-01-22: 708 mg via TOPICAL

## 2020-01-22 NOTE — Patient Instructions (Signed)

## 2020-01-22 NOTE — Progress Notes (Signed)
Patient completed PDT therapy today.  1. AK (actinic keratosis) (2) Left Lower Leg - Anterior; Right Lower Leg - Anterior  Photodynamic therapy - Left Lower Leg - Anterior, Right Lower Leg - Anterior Procedure discussed: discussed risks, benefits, side effects. and alternatives   Prep: site scrubbed/prepped with acetone   Location:  Legs Number of lesions:  Multiple Type of treatment:  Blue light Aminolevulinic Acid (see MAR for details): Levulan Number of Levulan sticks used:  2 Incubation time (minutes):  0 Number of minutes under lamp:  30 Cooling:  Floor fan Outcome: patient tolerated procedure well with no complications   Post-procedure details: sunscreen applied

## 2020-02-04 ENCOUNTER — Ambulatory Visit: Payer: Medicare HMO | Admitting: Podiatry

## 2020-02-11 ENCOUNTER — Ambulatory Visit: Payer: Medicare HMO | Admitting: Podiatry

## 2020-02-13 ENCOUNTER — Other Ambulatory Visit: Payer: Self-pay

## 2020-02-13 ENCOUNTER — Ambulatory Visit
Admission: RE | Admit: 2020-02-13 | Discharge: 2020-02-13 | Disposition: A | Discharge status: Home / Self Care | Payer: Medicare HMO | Source: Ambulatory Visit | Attending: Internal Medicine | Admitting: Internal Medicine

## 2020-02-13 DIAGNOSIS — Z1231 Encounter for screening mammogram for malignant neoplasm of breast: Secondary | ICD-10-CM | POA: Diagnosis present

## 2020-02-24 ENCOUNTER — Ambulatory Visit: Payer: Medicare HMO | Admitting: Dermatology

## 2020-02-24 ENCOUNTER — Other Ambulatory Visit: Payer: Self-pay

## 2020-02-24 DIAGNOSIS — L565 Disseminated superficial actinic porokeratosis (DSAP): Secondary | ICD-10-CM | POA: Diagnosis not present

## 2020-02-24 DIAGNOSIS — L578 Other skin changes due to chronic exposure to nonionizing radiation: Secondary | ICD-10-CM

## 2020-02-24 DIAGNOSIS — L570 Actinic keratosis: Secondary | ICD-10-CM | POA: Diagnosis not present

## 2020-02-24 DIAGNOSIS — L820 Inflamed seborrheic keratosis: Secondary | ICD-10-CM

## 2020-02-24 DIAGNOSIS — L821 Other seborrheic keratosis: Secondary | ICD-10-CM | POA: Diagnosis not present

## 2020-02-24 DIAGNOSIS — D692 Other nonthrombocytopenic purpura: Secondary | ICD-10-CM | POA: Diagnosis not present

## 2020-02-24 NOTE — Progress Notes (Signed)
Follow-Up Visit   Subjective  Sheryl Kennedy is a 75 y.o. female who presents for the following: Follow-up.  Patient has DSAP of the bilateral lower legs. She had PDT treatment to the anterior lower legs 1 month ago (no incubation, 30 min treatment). She did not have much of a reaction during or after treatment. No pain. A few spots crusted up afterwards. She is using Cholesterol/Lovastatin cream to legs once a day. She has some scaly areas on legs.  Itchy spot on arm.  The following portions of the chart were reviewed this encounter and updated as appropriate:       Review of Systems:  No other skin or systemic complaints except as noted in HPI or Assessment and Plan.  Objective  Well appearing patient in no apparent distress; mood and affect are within normal limits.  A focused examination was performed including lower legs. Relevant physical exam findings are noted in the Assessment and Plan.  Objective  lower legs: Pink brown scaly macules some with keratotic rim.  Objective  L pretibia x 3, R calf x 2, L lat ankle x 1 (residual) (6): Keratotic papules.  Objective  Left ant ankle x 1, distal L forearm x 1 (2): Erythematous keratotic or waxy stuck-on papule or plaque.    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  The purpose is to decrease the number of clinically evident and  subclinical PreCancerous lesions to prevent progression to development of skin cancer by chemically destroying early precancer changes that may or may not be visible.  It has been shown to reduce the risk of developing skin cancer in the treated area. As a result of treatment, redness, scaling, crusting, and open sores may occur during treatment course. One or more than one of these methods may be used and may have to be used several times to control, suppress and eliminate the PreCancerous changes. Discussed treatment course, expected reaction, and possible side effects. - Recommend daily broad spectrum sunscreen SPF 30+ to sun-exposed areas, reapply every 2 hours as needed.  - Call for new or changing lesions. Will schedule PDT to anterior lower legs, incubation 1 hour, 16:40.  Seborrheic Keratoses - Stuck-on, waxy, tan-brown papules and plaques  - Discussed benign etiology and prognosis. - Observe - Call for any changes  Purpura - Chronic; persistent and recurrent.  Treatable, but not curable. 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 Switch from ointment to 2% Cholesterol/Lovastatin Cream QD/BID lower legs. Patient prefers to have the 60g tube sent in.   Recommend daily broad spectrum sunscreen SPF 30+ to sun-exposed areas, reapply every 2 hours as needed. Call for new or changing lesions. Wear long pants when outdoors.  Hypertrophic actinic  keratosis (6) L pretibia x 3, R calf x 2, L lat ankle x 1 (residual)  Destruction of lesion - L pretibia x 3, R calf x 2, L lat ankle x 1 (residual)  Destruction method: cryotherapy   Informed  consent: discussed and consent obtained   Lesion destroyed using liquid nitrogen: Yes   Region frozen until ice ball extended beyond lesion: Yes   Outcome: patient tolerated procedure well with no complications   Post-procedure details: wound care instructions given    Inflamed seborrheic keratosis (2) Left ant ankle x 1, distal L forearm x 1  Destruction of lesion - Left ant ankle x 1, distal L forearm x 1  Destruction method: cryotherapy   Informed consent: discussed and consent obtained   Lesion destroyed using liquid nitrogen: Yes   Region frozen until ice ball extended beyond lesion: Yes   Outcome: patient tolerated procedure well with no complications   Post-procedure details: wound care instructions given    Other Related Medications mupirocin ointment (BACTROBAN) 2 %  Return in about 3 weeks (around 03/16/2020) for PDT to lower legs, incubation x 1 hour. Follow-up with Dr Chauncey Cruel in 3 months.Lindi Adie, CMA, am acting as scribe for Brendolyn Patty, MD .  Documentation: I have reviewed the above documentation for accuracy and completeness, and I agree with the above.  Brendolyn Patty MD

## 2020-02-24 NOTE — Patient Instructions (Signed)
Cryotherapy Aftercare  . Wash gently with soap and water everyday.   Marland Kitchen Apply Vaseline and Band-Aid daily until healed.   Actinic Damage - Severe, chronic, secondary to cumulative UV radiation exposure over time - diffuse scaly erythematous macules and papules with underlying dyspigmentation - Discussed Prescription "Field Treatment" for Severe, Chronic Confluent Actinic Changes with Pre-Cancerous Actinic Keratoses Field treatment involves treatment of an entire area of skin that has confluent Actinic Changes (Sun/ Ultraviolet light damage) and PreCancerous Actinic Keratoses by method of PhotoDynamic Therapy (PDT) and/or prescription Topical Chemotherapy agents such as 5-fluorouracil, 5-fluorouracil/calcipotriene, and/or imiquimod.  The purpose is to decrease the number of clinically evident and subclinical PreCancerous lesions to prevent progression to development of skin cancer by chemically destroying early precancer changes that may or may not be visible.  It has been shown to reduce the risk of developing skin cancer in the treated area. As a result of treatment, redness, scaling, crusting, and open sores may occur during treatment course. One or more than one of these methods may be used and may have to be used several times to control, suppress and eliminate the PreCancerous changes. Discussed treatment course, expected reaction, and possible side effects. - Recommend daily broad spectrum sunscreen SPF 30+ to sun-exposed areas, reapply every 2 hours as needed.  - Call for new or changing lesions.

## 2020-03-19 ENCOUNTER — Other Ambulatory Visit: Payer: Self-pay

## 2020-03-19 ENCOUNTER — Ambulatory Visit: Payer: Medicare HMO

## 2020-03-19 DIAGNOSIS — L570 Actinic keratosis: Secondary | ICD-10-CM | POA: Diagnosis not present

## 2020-03-19 MED ORDER — AMINOLEVULINIC ACID HCL 20 % EX SOLR
2.0000 "application " | Freq: Once | CUTANEOUS | Status: AC
  Administered 2020-03-19: 708 mg via TOPICAL

## 2020-03-19 NOTE — Progress Notes (Signed)
    Assessment & Plan  AK (actinic keratosis) bilateral lower legs  Photodynamic therapy - bilateral lower legs Procedure discussed: discussed risks, benefits, side effects. and alternatives   Prep: site scrubbed/prepped with acetone   Location:  Bilateral legs Number of lesions:  Multiple (multiple) Type of treatment:  Blue light Aminolevulinic Acid (see MAR for details): Levulan Number of Levulan sticks used:  2 Incubation time (minutes):  60 Number of minutes under lamp:  16 (16) Number of seconds under lamp:  40 (40) Cooling:  Floor fan Outcome: patient tolerated procedure well with no complications   Post-procedure details: sunscreen applied and aftercare instructions given to patient    Return for May 26, 2020 as scheduled .  I, Marye Round, CMA, am acting as scribe for [employer] .

## 2020-03-19 NOTE — Patient Instructions (Signed)
1. d something for pain relief you may take 1 extra strength Tylenol (acetaminophen) AND 2 Ibuprofen (200mg  each) together every 4 hours as needed for pain. (do not take these if you are allergic to them or if you have a reason you should not take them.) Typically, you may only need pain medication for 1 to 3 days.

## 2020-03-19 NOTE — Patient Instructions (Signed)
Levulan/PDT Treatment Common Side Effects  - Burning/stinging, which may be severe and last up to 24-72 hours after your treatment  - Redness, swelling and/or peeling which may last up to 4 weeks  - Scaling/crusting which may last up to 2 weeks  - Sun sensitivity (you MUST avoid sun exposure for 48-72 hours after treatment)  Care Instructions  - Okay to wash with soap and water and shampoo as normal  - If needed, you can do a cold compress (ex. Ice packs) for comfort  - If okay with your Primary Doctor, you may use analgesics such as Tylenol every 4-6 hours, not to exceed recommended dose  - You may apply Cerave Healing Ointment, Vaseline or Aquaphor  - If you have a lot of swelling you may take a Benadryl to help with this (this may cause drowsiness)  Sun Precautions  - Wear a wide brim hat for the next week if outside  - Wear a sunblock with zinc or titanium dioxide at least SPF 50 daily   We will recheck you in 10-12 weeks. If any problems, please call the office and ask to speak with a nurse.   If you have any questions or concerns for your doctor, please call our main line at [phone] and press option 4 to reach your doctor's medical assistant. If no one answers, please leave a voicemail as directed and we will return your call as soon as possible. Messages left after 4 pm will be answered the following business day.   You may also send us a message via MyChart. We typically respond to MyChart messages within 1-2 business days.  For prescription refills, please ask your pharmacy to contact our office. Our fax number is [phone].  If you have an urgent issue when the clinic is closed that cannot wait until the next business day, you can page your doctor at the number below.    Please note that while we do our best to be available for urgent issues outside of office hours, we are not available 24/7.   If you have an urgent issue and are unable to reach us, you may  choose to seek medical care at your doctor's office, retail clinic, urgent care center, or emergency room.  If you have a medical emergency, please immediately call 911 or go to the emergency department.  Pager Numbers  - Dr. Kowalski: [phone]  - Dr. Moye: [phone]  - Dr. Stewart: [phone]  In the event of inclement weather, please call our main line at [phone] for an update on the status of any delays or closures.  Dermatology Medication Tips: Please keep the boxes that topical medications come in in order to help keep track of the instructions about where and how to use these. Pharmacies typically print the medication instructions only on the boxes and not directly on the medication tubes.   If your medication is too expensive, please contact our office at [phone] option 4 or send us a message through MyChart.   We are unable to tell what your co-pay for medications will be in advance as this is different depending on your insurance coverage. However, we may be able to find a substitute medication at lower cost or fill out paperwork to get insurance to cover a needed medication.   If a prior authorization is required to get your medication covered by your insurance company, please allow us 1-2 business days to complete this process.  Drug prices often vary depending on   where the prescription is filled and some pharmacies may offer cheaper prices.  The website www.goodrx.com contains coupons for medications through different pharmacies. The prices here do not account for what the cost may be with help from insurance (it may be cheaper with your insurance), but the website can give you the price if you did not use any insurance.  - You can print the associated coupon and take it with your prescription to the pharmacy.  - You may also stop by our office during regular business hours and pick up a GoodRx coupon card.  - If you need your prescription sent  electronically to a different pharmacy, notify our office through [facility] MyChart or by phone at [phone] option 4.  

## 2020-05-26 ENCOUNTER — Ambulatory Visit: Payer: Medicare HMO | Admitting: Dermatology

## 2020-06-09 ENCOUNTER — Other Ambulatory Visit: Payer: Self-pay | Admitting: Internal Medicine

## 2020-06-09 ENCOUNTER — Other Ambulatory Visit (HOSPITAL_COMMUNITY): Payer: Self-pay | Admitting: Internal Medicine

## 2020-06-09 DIAGNOSIS — R9389 Abnormal findings on diagnostic imaging of other specified body structures: Secondary | ICD-10-CM

## 2020-06-10 ENCOUNTER — Telehealth: Payer: Self-pay

## 2020-06-10 NOTE — Telephone Encounter (Signed)
Patient left a voicemail for our office asking for RF to [facility]. RX RF approved.

## 2020-06-16 ENCOUNTER — Other Ambulatory Visit: Payer: Self-pay

## 2020-06-16 ENCOUNTER — Ambulatory Visit
Admission: RE | Admit: 2020-06-16 | Discharge: 2020-06-16 | Disposition: A | Discharge status: Home / Self Care | Payer: Medicare HMO | Source: Ambulatory Visit | Attending: Internal Medicine | Admitting: Internal Medicine

## 2020-06-16 DIAGNOSIS — R9389 Abnormal findings on diagnostic imaging of other specified body structures: Secondary | ICD-10-CM | POA: Diagnosis not present

## 2020-06-30 ENCOUNTER — Other Ambulatory Visit (HOSPITAL_COMMUNITY): Payer: Self-pay | Admitting: Physician Assistant

## 2020-06-30 ENCOUNTER — Ambulatory Visit
Admission: RE | Admit: 2020-06-30 | Discharge: 2020-06-30 | Disposition: A | Discharge status: Home / Self Care | Payer: Medicare HMO | Source: Ambulatory Visit | Attending: Physician Assistant | Admitting: Physician Assistant

## 2020-06-30 ENCOUNTER — Other Ambulatory Visit: Payer: Self-pay

## 2020-06-30 ENCOUNTER — Other Ambulatory Visit: Payer: Self-pay | Admitting: Physician Assistant

## 2020-06-30 ENCOUNTER — Ambulatory Visit: Payer: Medicare HMO | Admitting: Podiatry

## 2020-06-30 DIAGNOSIS — R600 Localized edema: Secondary | ICD-10-CM

## 2020-07-21 ENCOUNTER — Other Ambulatory Visit: Payer: Self-pay

## 2020-07-21 ENCOUNTER — Ambulatory Visit: Payer: Medicare HMO | Admitting: Podiatry

## 2020-07-21 ENCOUNTER — Ambulatory Visit (INDEPENDENT_AMBULATORY_CARE_PROVIDER_SITE_OTHER): Payer: Medicare HMO

## 2020-07-21 DIAGNOSIS — R600 Localized edema: Secondary | ICD-10-CM

## 2020-07-21 NOTE — Progress Notes (Signed)
   HPI: 75 y.o. female presenting today for evaluation of left lower extremity edema. She was recently diagnosed with a DVT of the left lower extremity that was causing the edema.  She is currently on Plavix as per her PCP.  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  Dermatology: Skin is warm, dry and supple bilateral lower extremities. Negative for open lesions or macerations.  Vascular: Edema noted left lower extremity up to the level of the knee  Neurological: Epicritic and protective threshold grossly intact bilaterally.   Musculoskeletal Exam: Range of motion within normal limits to all pedal and ankle joints bilateral. Muscle strength 5/5 in all groups bilateral.  No significant pain with calf compression  Radiographic Exam:  Diffuse generalized osteoporosis and demineralization noted to the bones normal given the patient's age  Assessment: 1.  DVT left lower extremity 2.  Edema left lower extremity   Plan of Care:  1. Patient evaluated. X-Rays reviewed.  2.  Continue anticoagulant Plavix as per PCP 3.  Recommend compression hose daily 4.  Return to clinic as needed      Edrick Kins, DPM [facility]  Dr. Edrick Kins, DPM    [address], [place] [address]                Office [phone]  Fax [phone]

## 2020-08-07 ENCOUNTER — Other Ambulatory Visit: Payer: Self-pay | Admitting: Physician Assistant

## 2020-08-07 ENCOUNTER — Other Ambulatory Visit (HOSPITAL_COMMUNITY): Payer: Self-pay | Admitting: Physician Assistant

## 2020-08-07 DIAGNOSIS — I82412 Acute embolism and thrombosis of left femoral vein: Secondary | ICD-10-CM

## 2020-08-24 ENCOUNTER — Other Ambulatory Visit: Payer: Self-pay | Admitting: Internal Medicine

## 2020-08-24 ENCOUNTER — Ambulatory Visit
Admission: RE | Admit: 2020-08-24 | Discharge: 2020-08-24 | Disposition: A | Discharge status: Home / Self Care | Payer: Medicare HMO | Source: Ambulatory Visit | Attending: Internal Medicine | Admitting: Internal Medicine

## 2020-08-24 ENCOUNTER — Ambulatory Visit: Payer: Medicare HMO | Admitting: Dermatology

## 2020-08-24 ENCOUNTER — Other Ambulatory Visit: Payer: Self-pay

## 2020-08-24 DIAGNOSIS — L565 Disseminated superficial actinic porokeratosis (DSAP): Secondary | ICD-10-CM

## 2020-08-24 DIAGNOSIS — M7989 Other specified soft tissue disorders: Secondary | ICD-10-CM | POA: Insufficient documentation

## 2020-08-24 DIAGNOSIS — L820 Inflamed seborrheic keratosis: Secondary | ICD-10-CM | POA: Diagnosis not present

## 2020-08-24 DIAGNOSIS — D692 Other nonthrombocytopenic purpura: Secondary | ICD-10-CM | POA: Diagnosis not present

## 2020-08-24 DIAGNOSIS — L570 Actinic keratosis: Secondary | ICD-10-CM

## 2020-08-24 NOTE — Progress Notes (Signed)
Follow-Up Visit   Subjective  Sheryl Kennedy is a 75 y.o. female who presents for the following: DSAP (Lower legs, f/u, PDT with ALA, Cholesterol/lovastatin cream ran out needs refill) and check spots (Scalp, bil arms pt scratches).  Had recent fall and has large bruise and swelling of left lower leg.    The following portions of the chart were reviewed this encounter and updated as appropriate:       Review of Systems:  No other skin or systemic complaints except as noted in HPI or Assessment and Plan.  Objective  Well appearing patient in no apparent distress; mood and affect are within normal limits.  A focused examination was performed including bil lower legs, bil arms, scalp. Relevant physical exam findings are noted in the Assessment and Plan.  bil lower legs Pink brown scaly macules some with keratotic rim  Left Lower Leg - Anterior Purpura and edema of L lower leg  L vertex x 5, Total = 5 (5) Erythematous keratotic or waxy stuck-on papule or plaque.   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  Difficult to treat.  Recommend photoprotection and regular use of spf 30 or higher sunscreen to prevent worsening of condition and precancerous changes.  Continue Cholesterol 2% Lovastatin 2% Cream 240 gm- Apply twice daily as directed to affected areas arms and legs.  Sent to [facility]  Good results with PDT with ALA    Purpura ([place]) Left Lower Leg - Anterior  2ndary to recent trauma  Benign, observe  Inflamed seborrheic keratosis L vertex x 5, Total = 5  Recheck on f/u  Avoid picking  Destruction of lesion - L vertex x 5, Total = 5  Destruction method: cryotherapy   Informed consent: discussed and consent obtained    Lesion destroyed using liquid nitrogen: Yes   Region frozen until ice ball extended beyond lesion: Yes   Outcome: patient tolerated procedure well with no complications   Post-procedure details: wound care instructions given   Additional details:  Prior to procedure, discussed risks of blister formation, small wound, skin dyspigmentation, or rare scar following cryotherapy. Recommend Vaseline ointment to treated areas while healing.   Related Medications mupirocin ointment (BACTROBAN) 2 % Apply to affected areas qd/bid prn.  AK (actinic keratosis) (9) R pretibia x 6, R lat calf x 1, R forearm x 1, R forearm x 1  Hypertrophic  Destruction of lesion - R pretibia x 6, R lat calf x 1, R forearm x 1, R forearm x 1  Destruction method: cryotherapy   Informed consent: discussed and consent obtained   Lesion destroyed using liquid nitrogen: Yes   Region frozen until ice ball extended beyond lesion: Yes   Outcome: patient tolerated procedure well with no complications   Post-procedure details: wound care instructions given   Additional details:  Prior to procedure, discussed risks of blister formation, small wound, skin dyspigmentation, or rare scar following cryotherapy. Recommend Vaseline ointment to treated areas while healing.   Return in about 2 months (around 10/24/2020) for DSAP, Recheck ISKs scalp, AKs.  I, Sonya Hupman, RMA, am acting as scribe for Brendolyn Patty, MD .  Documentation: I have reviewed the above documentation for accuracy and completeness, and I agree with the  above.  Brendolyn Patty MD

## 2020-08-24 NOTE — Patient Instructions (Signed)

## 2020-10-14 ENCOUNTER — Ambulatory Visit
Admission: RE | Admit: 2020-10-14 | Discharge: 2020-10-14 | Disposition: A | Discharge status: Home / Self Care | Payer: Medicare HMO | Source: Ambulatory Visit | Attending: Physician Assistant | Admitting: Physician Assistant

## 2020-10-14 ENCOUNTER — Other Ambulatory Visit: Payer: Self-pay

## 2020-10-14 DIAGNOSIS — I82412 Acute embolism and thrombosis of left femoral vein: Secondary | ICD-10-CM | POA: Insufficient documentation

## 2020-10-26 ENCOUNTER — Ambulatory Visit: Payer: Medicare HMO | Admitting: Dermatology

## 2020-11-04 ENCOUNTER — Ambulatory Visit: Payer: Medicare HMO | Admitting: Dermatology

## 2020-11-04 ENCOUNTER — Other Ambulatory Visit: Payer: Self-pay

## 2020-11-04 ENCOUNTER — Encounter: Payer: Self-pay | Admitting: Dermatology

## 2020-11-04 DIAGNOSIS — L565 Disseminated superficial actinic porokeratosis (DSAP): Secondary | ICD-10-CM

## 2020-11-04 DIAGNOSIS — L578 Other skin changes due to chronic exposure to nonionizing radiation: Secondary | ICD-10-CM | POA: Diagnosis not present

## 2020-11-04 DIAGNOSIS — L821 Other seborrheic keratosis: Secondary | ICD-10-CM

## 2020-11-04 DIAGNOSIS — L570 Actinic keratosis: Secondary | ICD-10-CM

## 2020-11-04 NOTE — Patient Instructions (Addendum)
Cryotherapy Aftercare  Wash gently with soap and water everyday.   Apply Vaseline and Band-Aid daily until healed.    DSAP = Chronic inherited condition of sun-exposed skin, most commonly arms and legs.  Difficult to treat.  Recommend photoprotection and regular use of spf 30 or higher sunscreen to prevent worsening of condition and precancerous changes.  Continue Cholesterol 2% Lovastatin 2% Cream 240 gm- Apply twice daily as directed to affected areas arms and legs.     Seborrheic Keratosis  What causes seborrheic keratoses? Seborrheic keratoses are harmless, common skin growths that first appear during adult life.  As time goes by, more growths appear.  Some people may develop a large number of them.  Seborrheic keratoses appear on both covered and uncovered body parts.  They are not caused by sunlight.  The tendency to develop seborrheic keratoses can be inherited.  They vary in color from skin-colored to gray, brown, or even black.  They can be either smooth or have a rough, warty surface.   Seborrheic keratoses are superficial and look as if they were stuck on the skin.  Under the microscope this type of keratosis looks like layers upon layers of skin.  That is why at times the top layer may seem to fall off, but the rest of the growth remains and re-grows.    Treatment Seborrheic keratoses do not need to be treated, but can easily be removed in the office.  Seborrheic keratoses often cause symptoms when they rub on clothing or jewelry.  Lesions can be in the way of shaving.  If they become inflamed, they can cause itching, soreness, or burning.  Removal of a seborrheic keratosis can be accomplished by freezing, burning, or surgery. If any spot bleeds, scabs, or grows rapidly, please return to have it checked, as these can be an indication of a skin cancer.  If you have any questions or concerns for your doctor, please call our main line at [phone] and press option 4 to reach your  doctor's medical assistant. If no one answers, please leave a voicemail as directed and we will return your call as soon as possible. Messages left after 4 pm will be answered the following business day.   You may also send [place] a message via [facility]. We typically respond to MyChart messages within 1-2 business days.  For prescription refills, please ask your pharmacy to contact our office. Our fax number is [phone].  If you have an urgent issue when the clinic is closed that cannot wait until the next business day, you can page your doctor at the number below.    Please note that while we do our best to be available for urgent issues outside of office hours, we are not available 24/7.   If you have an urgent issue and are unable to reach [place], you may choose to seek medical care at your doctor's office, retail clinic, urgent care center, or emergency room.  If you have a medical emergency, please immediately call 911 or go to the emergency department.  Pager Numbers  - Dr. Nehemiah Massed: [phone]  - Dr. Laurence Ferrari: [phone]  - Dr. Nicole Kindred: [phone]  In the event of inclement weather, please call our main line at [phone] for an update on the status of any delays or closures.  Dermatology Medication Tips: Please keep the boxes that topical medications come in in order to help keep track of the instructions about where and how to use these. Pharmacies typically print the  medication instructions only on the boxes and not directly on the medication tubes.   If your medication is too expensive, please contact our office at [phone] option 4 or send [place] a message through [facility].   We are unable to tell what your co-pay for medications will be in advance as this is different depending on your insurance coverage. However, we may be able to find a substitute medication at lower cost or fill out paperwork to get insurance to cover a needed medication.   If a prior authorization is  required to get your medication covered by your insurance company, please allow [place] 1-2 business days to complete this process.  Drug prices often vary depending on where the prescription is filled and some pharmacies may offer cheaper prices.  The website www.goodrx.com contains coupons for medications through different pharmacies. The prices here do not account for what the cost may be with help from insurance (it may be cheaper with your insurance), but the website can give you the price if you did not use any insurance.  - You can print the associated coupon and take it with your prescription to the pharmacy.  - You may also stop by our office during regular business hours and pick up a GoodRx coupon card.  - If you need your prescription sent electronically to a different pharmacy, notify our office through [facility] or by phone at [phone] option 4.

## 2020-11-04 NOTE — Progress Notes (Signed)
Follow-Up Visit   Subjective  Sheryl Kennedy is a 75 y.o. female who presents for the following: Follow-up.  Patient here for 2 month follow-up. Recheck Inflamed Sks of the left vertex and Aks of the right pretibia, right lat calf, and right forearm. She has had PDT in past and was unable to tolerate it.  She has DSAP of the arms and legs and uses Cholesterol/Lovastatin Cream twice daily with some improvement. She also has a few dark spots on her legs she would like checked.   The following portions of the chart were reviewed this encounter and updated as appropriate:       Review of Systems:  No other skin or systemic complaints except as noted in HPI or Assessment and Plan.  Objective  Well appearing patient in no apparent distress; mood and affect are within normal limits.  A focused examination was performed including face, scalp, lower legs, arms. Relevant physical exam findings are noted in the Assessment and Plan.  arms, legs Pink brown macules some with focal areas of hyperkeratotic scale.  R lat ankle x 2, R calf x 1, R ant ankle x 1, R pretibia x 1, L pretibia x 1, L hand dorsum x 1, Mid crown and vertex (some residual) x 5, L sideburn x 1 (13) Pink scaly papules.    Assessment & Plan   Actinic Damage - chronic, secondary to cumulative UV radiation exposure/sun exposure over time - diffuse scaly erythematous macules with underlying dyspigmentation - Recommend daily broad spectrum sunscreen SPF 30+ to sun-exposed areas, reapply every 2 hours as needed.  - Recommend staying in the shade or wearing long sleeves, sun glasses (UVA+UVB protection) and wide brim hats (4-inch brim around the entire circumference of the hat). - Call for new or changing lesions.  Seborrheic Keratoses - Stuck-on, waxy, tan-brown papules and/or plaques  - Benign-appearing - Discussed benign etiology and prognosis. 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  Difficult to treat.  Recommend photoprotection and regular use of spf 30 or higher sunscreen to prevent worsening of condition and precancerous changes.  Continue Cholesterol 2% Lovastatin 2% Cream 240 gm- Apply twice daily as directed to affected areas arms and legs.    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 They are chronic with expected duration over 1 year. A portion of actinic keratoses will progress to squamous cell carcinoma of the skin. It is not possible to reliably predict which spots will progress to skin cancer and so treatment is recommended to prevent development of skin cancer.  Recommend daily broad spectrum sunscreen SPF 30+ to sun-exposed areas, reapply every 2 hours as needed.  Recommend staying in the shade or wearing long sleeves, sun glasses (UVA+UVB protection) and wide brim hats (4-inch brim around the entire circumference of the hat). Call for new or changing lesions.  Destruction of lesion - R lat ankle x 2, R calf x 1, R ant ankle x 1, R pretibia x 1, L pretibia x 1, L hand dorsum x 1,  Mid crown and vertex (some residual) x 5, L sideburn x 1  Destruction method: cryotherapy   Informed consent: discussed and consent obtained   Lesion destroyed using liquid nitrogen: Yes   Region frozen until ice ball extended beyond lesion: Yes   Outcome: patient tolerated procedure well with no complications   Post-procedure details: wound care instructions given   Additional details:  Prior to procedure, discussed risks of blister formation, small wound, skin dyspigmentation, or  rare scar following cryotherapy. Recommend Vaseline ointment to treated areas while healing.   Return in about 3 months (around 02/04/2021) for AKs, ISKs, DSAP.  IJamesetta Orleans, CMA, am acting as scribe for Brendolyn Patty, MD .  Documentation: I have reviewed the above documentation for accuracy and completeness, and I agree with the above.  Brendolyn Patty MD

## 2021-01-12 ENCOUNTER — Other Ambulatory Visit: Payer: Self-pay | Admitting: Internal Medicine

## 2021-01-13 ENCOUNTER — Other Ambulatory Visit: Payer: Self-pay | Admitting: Internal Medicine

## 2021-01-13 DIAGNOSIS — Z1231 Encounter for screening mammogram for malignant neoplasm of breast: Secondary | ICD-10-CM

## 2021-02-09 ENCOUNTER — Ambulatory Visit: Payer: Medicare HMO | Admitting: Dermatology

## 2021-02-19 ENCOUNTER — Ambulatory Visit
Admission: RE | Admit: 2021-02-19 | Discharge: 2021-02-19 | Disposition: A | Discharge status: Home / Self Care | Payer: Medicare HMO | Source: Ambulatory Visit | Attending: Internal Medicine | Admitting: Internal Medicine

## 2021-02-19 ENCOUNTER — Other Ambulatory Visit: Payer: Self-pay

## 2021-02-19 DIAGNOSIS — Z1231 Encounter for screening mammogram for malignant neoplasm of breast: Secondary | ICD-10-CM | POA: Diagnosis present

## 2021-03-01 ENCOUNTER — Other Ambulatory Visit: Payer: Self-pay

## 2021-03-01 ENCOUNTER — Ambulatory Visit: Payer: Medicare HMO | Admitting: Dermatology

## 2021-03-01 DIAGNOSIS — L565 Disseminated superficial actinic porokeratosis (DSAP): Secondary | ICD-10-CM | POA: Diagnosis not present

## 2021-03-01 DIAGNOSIS — L578 Other skin changes due to chronic exposure to nonionizing radiation: Secondary | ICD-10-CM | POA: Diagnosis not present

## 2021-03-01 DIAGNOSIS — L820 Inflamed seborrheic keratosis: Secondary | ICD-10-CM | POA: Diagnosis not present

## 2021-03-01 DIAGNOSIS — L570 Actinic keratosis: Secondary | ICD-10-CM | POA: Diagnosis not present

## 2021-03-01 DIAGNOSIS — L219 Seborrheic dermatitis, unspecified: Secondary | ICD-10-CM

## 2021-03-01 MED ORDER — FLUOCINONIDE 0.05 % EX SOLN: 1.0000 "application " | Freq: Every day | CUTANEOUS | 2 refills | Status: DC

## 2021-03-01 MED ORDER — KETOCONAZOLE 2 % EX SHAM: 1.0000 "application " | MEDICATED_SHAMPOO | CUTANEOUS | 3 refills | Status: DC

## 2021-03-01 NOTE — Progress Notes (Signed)
Follow-Up Visit   Subjective  Sheryl Kennedy is a 76 y.o. female who presents for the following: DSAP (Arms, legs, 70m f/u, Cholesterol 2%, Lovastatin 2% cr bid) and Actinic Keratosis (Legs, L hand, scalp, L sideburn, 61m f/u).  The patient has spots, moles and lesions to be evaluated, some may be new or changing and the patient has concerns that these could be cancer.  Some spots are itchy on R thigh and L forehead.   The following portions of the chart were reviewed this encounter and updated as appropriate:       Review of Systems:  No other skin or systemic complaints except as noted in HPI or Assessment and Plan.  Objective  Well appearing patient in no apparent distress; mood and affect are within normal limits.  A focused examination was performed including face, scalp, arms, legs. Relevant physical exam findings are noted in the Assessment and Plan.  legs, arms Pink brown macules some with focal areas of hyperkeratotic scale     R medial thigh x 1, R lat thigh x 1, R medial knee x 1, L upper forehead x 1 (4) Stuck on waxy papules with erythema  vertex scalp x 3 (3) Pink scaly macules  Scalp Erythema with white adherent scale crown scalp  L lower leg x 5, R pretibia x 3 (8) Hyperkeratotic scaly papules    Assessment & Plan   Actinic Damage - chronic, secondary to cumulative UV radiation exposure/sun exposure over time - diffuse scaly erythematous macules with underlying dyspigmentation - Recommend daily broad spectrum sunscreen SPF 30+ to sun-exposed areas, reapply every 2 hours as needed.  - Recommend staying in the shade or wearing long sleeves, sun glasses (UVA+UVB protection) and wide brim hats (4-inch brim around the entire circumference of the hat). - Call for new or changing lesions.  - arms, legs  DSAP (disseminated superficial actinic porokeratosis) legs, arms  Stable. 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  Difficult to treat.  Recommend photoprotection and regular use of spf 30 or higher sunscreen to prevent worsening of condition and precancerous changes.  Cont Cholesterol 2% Lovastatin 2% Cream 240 gm- Apply twice daily as directed to affected areas arms and legs.  RF sent to [facility]    Inflamed seborrheic keratosis (4) R medial thigh x 1, R lat thigh x 1, R medial knee x 1, L upper forehead x 1  Destruction of lesion - R medial thigh x 1, R lat thigh x 1, R medial knee x 1, L upper forehead x 1  Destruction method: cryotherapy   Informed consent: discussed and consent obtained   Lesion destroyed using liquid nitrogen: Yes   Region frozen until ice ball extended beyond lesion: Yes   Outcome: patient tolerated procedure well with no complications   Post-procedure details: wound care instructions given   Additional details:  Prior to procedure, discussed risks of blister formation, small wound, skin dyspigmentation, or rare scar following cryotherapy. Recommend Vaseline ointment to treated areas while healing.   Related Medications mupirocin ointment (BACTROBAN) 2 % Apply to affected areas qd/bid prn.  AK (actinic keratosis) (3) vertex scalp x 3  Also treating area for Seb Derm.  If not improved on f/up consider 5FU/VitD solution x 7-10 days.  Actinic keratoses are precancerous spots that appear secondary to cumulative UV radiation exposure/sun exposure over time. They are chronic with expected duration over 1 year. A  portion of actinic keratoses will progress to squamous cell carcinoma of the skin. It is not possible to reliably predict which spots will progress to skin cancer and so treatment is recommended to prevent development of skin cancer.  Recommend daily broad spectrum sunscreen SPF 30+ to sun-exposed areas, reapply every 2 hours as needed.  Recommend staying in the shade or wearing long sleeves, sun glasses (UVA+UVB  protection) and wide brim hats (4-inch brim around the entire circumference of the hat). Call for new or changing lesions.   Destruction of lesion - vertex scalp x 3  Destruction method: cryotherapy   Informed consent: discussed and consent obtained   Lesion destroyed using liquid nitrogen: Yes   Region frozen until ice ball extended beyond lesion: Yes   Outcome: patient tolerated procedure well with no complications   Post-procedure details: wound care instructions given   Additional details:  Prior to procedure, discussed risks of blister formation, small wound, skin dyspigmentation, or rare scar following cryotherapy. Recommend Vaseline ointment to treated areas while healing.   Seborrheic dermatitis Scalp  Seborrheic Dermatitis  -  is a chronic persistent rash characterized by pinkness and scaling most commonly of the mid face but also can occur on the scalp (dandruff), ears; mid chest, mid back and groin.  It tends to be exacerbated by stress and cooler weather.  People who have neurologic disease may experience new onset or exacerbation of existing seborrheic dermatitis.  The condition is not curable but treatable and can be controlled.  Start Ketoconazole 2% shampoo 2-3x/wk, let sit 5 minutes and rinse out Start Lidex scalp solution 1-2 gtts to rough flaky bumps scalp qhs Recheck on f/up  ketoconazole (NIZORAL) 2 % shampoo - Scalp Apply 1 application topically as directed. Wash scalp 2-3 times weekly, Let sit 5 minutes and rinse out  fluocinonide (LIDEX) 0.05 % external solution - Scalp Apply 1 application topically at bedtime. Apply to rough scaly bumps scalp qhs until clear  Hypertrophic actinic keratosis (8) L lower leg x 5, R pretibia x 3  Destruction of lesion - L lower leg x 5, R pretibia x 3  Destruction method: cryotherapy   Informed consent: discussed and consent obtained   Lesion destroyed using liquid nitrogen: Yes   Region frozen until ice ball extended beyond  lesion: Yes   Outcome: patient tolerated procedure well with no complications   Post-procedure details: wound care instructions given   Additional details:  Prior to procedure, discussed risks of blister formation, small wound, skin dyspigmentation, or rare scar following cryotherapy. Recommend Vaseline ointment to treated areas while healing.    Return in about 3 months (around 05/29/2021) for Seb derm f/u, AK f/u, DSAP f/u.  I, Othelia Pulling, RMA, am acting as scribe for Brendolyn Patty, MD .  Documentation: I have reviewed the above documentation for accuracy and completeness, and I agree with the above.  Brendolyn Patty MD

## 2021-03-01 NOTE — Patient Instructions (Addendum)

## 2021-06-07 ENCOUNTER — Ambulatory Visit: Payer: Medicare HMO | Admitting: Dermatology

## 2021-06-07 DIAGNOSIS — L565 Disseminated superficial actinic porokeratosis (DSAP): Secondary | ICD-10-CM | POA: Diagnosis not present

## 2021-06-07 DIAGNOSIS — L219 Seborrheic dermatitis, unspecified: Secondary | ICD-10-CM | POA: Diagnosis not present

## 2021-06-07 DIAGNOSIS — L820 Inflamed seborrheic keratosis: Secondary | ICD-10-CM

## 2021-06-07 DIAGNOSIS — L720 Epidermal cyst: Secondary | ICD-10-CM | POA: Diagnosis not present

## 2021-06-07 DIAGNOSIS — L570 Actinic keratosis: Secondary | ICD-10-CM

## 2021-06-07 DIAGNOSIS — L578 Other skin changes due to chronic exposure to nonionizing radiation: Secondary | ICD-10-CM | POA: Diagnosis not present

## 2021-06-07 MED ORDER — MUPIROCIN 2 % EX OINT: TOPICAL_OINTMENT | CUTANEOUS | 2 refills | Status: DC

## 2021-06-07 NOTE — Patient Instructions (Signed)

## 2021-06-07 NOTE — Progress Notes (Signed)
Follow-Up Visit   Subjective  Sheryl Kennedy is a 76 y.o. female who presents for the following: Recheck AK's (Of the vertex scalp and legs - check for any new or persistent precancerous skin lesions), Seborrheic dermatitis (Of the scalp - treating with Lidex solution and Ketoconazole 2% shampoo, pt has noticed an improvement in condition.), DSAP (Of the legs - treating with cholesterol/lovastatin cream ), and Irregular skin lesion (On the R lat brow - pt is concerned and would like it checked today). It is bothersome.   The following portions of the chart were reviewed this encounter and updated as appropriate:       Review of Systems:  No other skin or systemic complaints except as noted in HPI or Assessment and Plan.  Objective  Well appearing patient in no apparent distress; mood and affect are within normal limits.  A focused examination was performed including the face, scalp, and legs. Relevant physical exam findings are noted in the Assessment and Plan.  B/L leg Pink scaly macules with keratotic rim.  R lat brow 0.3 cm firm blue papule with punctum.  Vertex scalp x 5 (5) Keratotic papules.     Assessment & Plan  Disseminated superficial actinic porokeratosis (DSAP) B/L leg  Stable. 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  Difficult to treat.  Recommend photoprotection and regular use of spf 30 or higher sunscreen to prevent worsening of condition and precancerous changes.   Continue Cholesterol/Lovastatin cream BID.  Seborrheic dermatitis Scalp  Chronic and persistent condition with duration or expected duration over one year. Condition is symptomatic/ bothersome to patient. Improving but not currently at goal.   Seborrheic Dermatitis  -  is a chronic persistent rash characterized by pinkness and scaling most commonly of the mid face but also can occur on the scalp (dandruff), ears;  mid chest, mid back and groin.  It tends to be exacerbated by stress and cooler weather.  People who have neurologic disease may experience new onset or exacerbation of existing seborrheic dermatitis.  The condition is not curable but treatable and can be controlled.  Continue Ketoconazole 2% shampoo 2-3 times weekly, and Lidex solution to aa's scalp QHS.   Related Medications ketoconazole (NIZORAL) 2 % shampoo Apply 1 application topically as directed. Wash scalp 2-3 times weekly, Let sit 5 minutes and rinse out  fluocinonide (LIDEX) 0.05 % external solution Apply 1 application topically at bedtime. Apply to rough scaly bumps scalp qhs until clear  Epidermal inclusion cyst R lat brow  Benign-appearing. Exam most consistent with an epidermal inclusion cyst. Discussed that a cyst is a benign growth that can grow over time and sometimes get irritated or inflamed. Recommend observation if it is not bothersome. Discussed option of surgical excision to remove it if it is growing, symptomatic, or other changes noted.  Symptomatic, irritating, patient would like treated.     Acne/Milia surgery - R lat brow Procedure risks and benefits were discussed with the patient and verbal consent was obtained. Following prep of the skin on the R lat brow with an alcohol swab, extraction of milia was performed with a comedone extractor following superficial incision made over their surfaces with a #11 surgical blade. Capillary hemostasis was achieved with 20% aluminum chloride solution. Vaseline ointment was applied to each site. The patient tolerated the procedure well.  AK (actinic keratosis) (5) Vertex scalp x 5  Actinic keratoses are precancerous spots that  appear secondary to cumulative UV radiation exposure/sun exposure over time. They are chronic with expected duration over 1 year. A portion of actinic keratoses will progress to squamous cell carcinoma of the skin. It is not possible to reliably predict  which spots will progress to skin cancer and so treatment is recommended to prevent development of skin cancer.  Recommend daily broad spectrum sunscreen SPF 30+ to sun-exposed areas, reapply every 2 hours as needed.  Recommend staying in the shade or wearing long sleeves, sun glasses (UVA+UVB protection) and wide brim hats (4-inch brim around the entire circumference of the hat). Call for new or changing lesions.    Start Mupirocin 2% ointment to aa's QD until healed.  Destruction of lesion - Vertex scalp x 5  Destruction method: cryotherapy   Informed consent: discussed and consent obtained   Lesion destroyed using liquid nitrogen: Yes   Region frozen until ice ball extended beyond lesion: Yes   Outcome: patient tolerated procedure well with no complications   Post-procedure details: wound care instructions given   Additional details:  Prior to procedure, discussed risks of blister formation, small wound, skin dyspigmentation, or rare scar following cryotherapy. Recommend Vaseline ointment to treated areas while healing.   Inflamed seborrheic keratosis  Related Medications mupirocin ointment (BACTROBAN) 2 % Apply to affected areas qd/bid prn.   Actinic Damage - chronic, secondary to cumulative UV radiation exposure/sun exposure over time - diffuse scaly erythematous macules with underlying dyspigmentation - Recommend daily broad spectrum sunscreen SPF 30+ to sun-exposed areas, reapply every 2 hours as needed.  - Recommend staying in the shade or wearing long sleeves, sun glasses (UVA+UVB protection) and wide brim hats (4-inch brim around the entire circumference of the hat). - Call for new or changing lesions.  Return in about 3 months (around 09/07/2021) for AKs scalp.  Luther Redo, CMA, am acting as scribe for Brendolyn Patty, MD .  Documentation: I have reviewed the above documentation for accuracy and completeness, and I agree with the above.  Brendolyn Patty MD

## 2021-07-29 ENCOUNTER — Other Ambulatory Visit: Payer: Self-pay | Admitting: Family Medicine

## 2021-07-29 DIAGNOSIS — M5412 Radiculopathy, cervical region: Secondary | ICD-10-CM

## 2021-08-11 ENCOUNTER — Other Ambulatory Visit: Payer: Medicare HMO

## 2021-08-23 ENCOUNTER — Ambulatory Visit
Admission: RE | Admit: 2021-08-23 | Discharge: 2021-08-23 | Disposition: A | Discharge status: Home / Self Care | Payer: Medicare HMO | Source: Ambulatory Visit | Attending: Family Medicine | Admitting: Family Medicine

## 2021-08-23 DIAGNOSIS — M5412 Radiculopathy, cervical region: Secondary | ICD-10-CM

## 2021-09-14 ENCOUNTER — Ambulatory Visit: Payer: Medicare HMO | Admitting: Dermatology

## 2021-09-14 DIAGNOSIS — L565 Disseminated superficial actinic porokeratosis (DSAP): Secondary | ICD-10-CM

## 2021-09-14 DIAGNOSIS — L570 Actinic keratosis: Secondary | ICD-10-CM | POA: Diagnosis not present

## 2021-09-14 DIAGNOSIS — L219 Seborrheic dermatitis, unspecified: Secondary | ICD-10-CM

## 2021-09-14 MED ORDER — KETOCONAZOLE 2 % EX SHAM: 1.0000 | MEDICATED_SHAMPOO | CUTANEOUS | 3 refills | Status: DC

## 2021-09-14 MED ORDER — FLUOCINOLONE ACETONIDE BODY 0.01 % EX OIL: TOPICAL_OIL | CUTANEOUS | 5 refills | Status: DC

## 2021-09-14 MED ORDER — CLOBETASOL PROPIONATE 0.05 % EX SOLN: CUTANEOUS | 0 refills | Status: DC

## 2021-09-14 NOTE — Progress Notes (Signed)
Follow-Up Visit   Subjective  Sheryl Kennedy is a 76 y.o. female who presents for the following: Follow-up (Patient here today for follow up on aks at vertex scalp. Patient reports still having some sore areas at top of scalp. Patient also reports she would like to discuss alternative treatment for seb derm at scalp. She would like something else beside flucinonide solution- too expensive. Patient reports spot at right lower leg that looks different. ).   The following portions of the chart were reviewed this encounter and updated as appropriate:      Review of Systems: No other skin or systemic complaints except as noted in HPI or Assessment and Plan.   Objective  Well appearing patient in no apparent distress; mood and affect are within normal limits.  A focused examination was performed including b/l legs, scalp, right lower leg . Relevant physical exam findings are noted in the Assessment and Plan.  right pretibia x 3, right lower calf x 3, right wrist x 1, left pretibia x 4 Erythematous thin papules/macules with gritty scale.   Scalp Multiple adherent thick scaly papules at crown and scalp, some with crusting  b/l legs 1.5 mm heme crusted papule on rt lateral upper calf, multiple pink/brown macules/papules with keratotic scale   Assessment & Plan  Actinic keratosis right pretibia x 3, right lower calf x 3, right wrist x 1, left pretibia x 4  Actinic keratoses are precancerous spots that appear secondary to cumulative UV radiation exposure/sun exposure over time. They are chronic with expected duration over 1 year. A portion of actinic keratoses will progress to squamous cell carcinoma of the skin. It is not possible to reliably predict which spots will progress to skin cancer and so treatment is recommended to prevent development of skin cancer.  Recommend daily broad spectrum sunscreen SPF 30+ to sun-exposed areas, reapply every 2 hours as needed.  Recommend staying in the  shade or wearing long sleeves, sun glasses (UVA+UVB protection) and wide brim hats (4-inch brim around the entire circumference of the hat). Call for new or changing lesions.  Destruction of lesion - right pretibia x 3, right lower calf x 3, right wrist x 1, left pretibia x 4  Destruction method: cryotherapy   Informed consent: discussed and consent obtained   Lesion destroyed using liquid nitrogen: Yes   Region frozen until ice ball extended beyond lesion: Yes   Outcome: patient tolerated procedure well with no complications   Post-procedure details: wound care instructions given   Additional details:  Prior to procedure, discussed risks of blister formation, small wound, skin dyspigmentation, or rare scar following cryotherapy. Recommend Vaseline ointment to treated areas while healing.   Seborrheic dermatitis Scalp  With pityriasis amiantacea vs Aks, Chronic and persistent condition with duration or expected duration over one year. Condition is bothersome/symptomatic for patient. Currently flared.   Seborrheic Dermatitis  -  is a chronic persistent rash characterized by pinkness and scaling most commonly of the mid face but also can occur on the scalp (dandruff), ears; mid chest, mid back and groin.  It tends to be exacerbated by stress and cooler weather.  People who have neurologic disease may experience new onset or exacerbation of existing seborrheic dermatitis.  The condition is not curable but treatable and can be controlled.  Start Clobetasol 0.05 % external solution - apply topically to scalp in morning after shampoo qd for itchy scalp.  Continue Ketoconazole 2 % shampoo - apply topically as directed. Wash  scalp 2 - 3 times weekly. Let sit 5 minutes and rinse out.   Start Fluocinolone Acetonide body 0.01 % oil - apply topically to crown of scalp cover with shower cap nightly before bed. Wash out in AM. Apply for 2 weeks  If not improved on f/up, will treat spots with  cryotherapy   Fluocinolone Acetonide Body 0.01 % OIL - Scalp Apply topically to itchy scaly spot at scalp cover with shower cap qhs  clobetasol (TEMOVATE) 0.05 % external solution - Scalp Apply topically to scalp in morning after shampoo hair qd for itchy scalp  ketoconazole (NIZORAL) 2 % shampoo - Scalp Apply 1 Application topically as directed. 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 Condition is symptomatic/ bothersome to patient. Not currently at goal.   DSAP is a chronic inherited condition of sun-exposed skin, most commonly affecting the arms and legs.  It is difficult to treat.  Recommend photoprotection and regular use of spf 30 or higher sunscreen to prevent worsening of condition and precancerous changes.  continue Cholesterol 2% Lovastatin 2% Cream 240 gm- Apply twice daily as directed to affected areas arms and legs.   Return for 3 week seb derm follow up. I, Ruthell Rummage, CMA, am acting as scribe for Brendolyn Patty, MD.  Documentation: I have reviewed the above documentation for accuracy and completeness, and I agree with the above.  Brendolyn Patty MD

## 2021-09-14 NOTE — Patient Instructions (Addendum)
Continue Ketoconazole shampoo 2 % - Wash scalp 2-3 times weekly, Let sit 5 minutes and rinse out  Start Clobetasol solution 0.05 % - apply topically to scalp after shampoo in morning to itchy areas of scalp.   Start Fluocinolone Oil 0.01 % - apply topically to itchy scaly areas of scalp nightly cover with shower cap and leave until morning.   Topical steroids (such as triamcinolone, fluocinolone, fluocinonide, mometasone, clobetasol, halobetasol, betamethasone, hydrocortisone) can cause thinning and lightening of the skin if they are used for too long in the same area. Your physician has selected the right strength medicine for your problem and area affected on the body. Please use your medication only as directed by your physician to prevent side effects.    Due to recent changes in healthcare laws, you may see results of your pathology and/or laboratory studies on MyChart before the doctors have had a chance to review them. We understand that in some cases there may be results that are confusing or concerning to you. Please understand that not all results are received at the same time and often the doctors may need to interpret multiple results in order to provide you with the best plan of care or course of treatment. Therefore, we ask that you please give [place] 2 business days to thoroughly review all your results before contacting the office for clarification. Should we see a critical lab result, you will be contacted sooner.   If You Need Anything After Your Visit  If you have any questions or concerns for your doctor, please call our main line at [phone] and press option 4 to reach your doctor's medical assistant. If no one answers, please leave a voicemail as directed and we will return your call as soon as possible. Messages left after 4 pm will be answered the following business day.   You may also send [place] a message via [place]. We typically respond to MyChart messages within 1-2 business  days.  For prescription refills, please ask your pharmacy to contact our office. Our fax number is [phone].  If you have an urgent issue when the clinic is closed that cannot wait until the next business day, you can page your doctor at the number below.    Please note that while we do our best to be available for urgent issues outside of office hours, we are not available 24/7.   If you have an urgent issue and are unable to reach [place], you may choose to seek medical care at your doctor's office, retail clinic, urgent care center, or emergency room.  If you have a medical emergency, please immediately call 911 or go to the emergency department.  Pager Numbers  - Dr. Nehemiah Massed: [phone]  - Dr. Laurence Ferrari: [phone]  - Dr. Nicole Kindred: [phone]  In the event of inclement weather, please call our main line at [phone] for an update on the status of any delays or closures.  Dermatology Medication Tips: Please keep the boxes that topical medications come in in order to help keep track of the instructions about where and how to use these. Pharmacies typically print the medication instructions only on the boxes and not directly on the medication tubes.   If your medication is too expensive, please contact our office at [phone] option 4 or send [place] a message through [place].   We are unable to tell what your co-pay for medications will be in advance as this is different depending on your insurance coverage. However, we  may be able to find a substitute medication at lower cost or fill out paperwork to get insurance to cover a needed medication.   If a prior authorization is required to get your medication covered by your insurance company, please allow [place] 1-2 business days to complete this process.  Drug prices often vary depending on where the prescription is filled and some pharmacies may offer cheaper prices.  The website www.goodrx.com contains coupons for medications through  different pharmacies. The prices here do not account for what the cost may be with help from insurance (it may be cheaper with your insurance), but the website can give you the price if you did not use any insurance.  - You can print the associated coupon and take it with your prescription to the pharmacy.  - You may also stop by our office during regular business hours and pick up a GoodRx coupon card.  - If you need your prescription sent electronically to a different pharmacy, notify our office through [facility] or by phone at [phone] option 4.     Si Usted Necesita Algo Despus de Su Visita  Tambin puede enviarnos un mensaje a travs de Pharmacist, community. Por lo general respondemos a los mensajes de MyChart en el transcurso de 1 a 2 das hbiles.  Para renovar recetas, por favor pida a su farmacia que se ponga en contacto con nuestra oficina. Harland Dingwall de fax es [place] [phone].  Si tiene un asunto urgente cuando la clnica est cerrada y que no puede esperar hasta el siguiente da hbil, puede llamar/localizar a su doctor(a) al nmero que aparece a continuacin.   Por favor, tenga en cuenta que aunque hacemos todo lo posible para estar disponibles para asuntos urgentes fuera del horario de [place], no estamos disponibles las 24 horas del da, los 7 das de la [place].   Si tiene un problema urgente y no puede comunicarse con nosotros, puede optar por buscar atencin mdica  en el consultorio de su doctor(a), en una clnica privada, en un centro de atencin urgente o en una sala de emergencias.  Si tiene Engineering geologist, por favor llame inmediatamente al 911 o vaya a la sala de emergencias.  Nmeros de bper  - Dr. Nehemiah Massed: [phone]  - Dra. Moye: [phone]  - Dra. Nicole Kindred: [phone]  En caso de inclemencias del [place], por favor llame a Johnsie Kindred principal al [phone] para una actualizacin sobre el [place] de cualquier retraso o cierre.  Consejos  para la medicacin en dermatologa: Por favor, guarde las cajas en las que vienen los medicamentos de uso tpico para ayudarle a seguir las instrucciones sobre dnde y cmo usarlos. Las farmacias generalmente imprimen las instrucciones del medicamento slo en las cajas y no directamente en los tubos del [place].   Si su medicamento es muy caro, por favor, pngase en contacto con Zigmund Daniel llamando al [phone] y presione la opcin 4 o envenos un mensaje a travs de Pharmacist, community.   No podemos decirle cul ser su copago por los medicamentos por adelantado ya que esto es diferente dependiendo de la cobertura de su seguro. Sin embargo, es posible que podamos encontrar un medicamento sustituto a Electrical engineer un formulario para que el seguro cubra el medicamento que se considera necesario.   Si se requiere una autorizacin previa para que su compaa de seguros [place] su medicamento, por favor permtanos de 1 a 2 das hbiles para completar este proceso.  Los precios de los [employer]  varan con frecuencia dependiendo del lugar de dnde se surte la receta y alguna farmacias pueden ofrecer precios ms baratos.  El sitio web www.goodrx.com tiene cupones para medicamentos de Airline pilot. Los precios aqu no tienen en cuenta lo que podra costar con la ayuda del seguro (puede ser ms barato con su seguro), pero el sitio web puede darle el precio si no utiliz Research scientist (physical sciences).  - Puede imprimir el cupn correspondiente y llevarlo con su receta a la farmacia.  - Tambin puede pasar por nuestra oficina durante el horario de atencin regular y Charity fundraiser una tarjeta de cupones de GoodRx.  - Si necesita que su receta se enve electrnicamente a una farmacia diferente, informe a nuestra oficina a travs de MyChart de [place] o por telfono llamando al [phone] y presione la opcin 4.

## 2021-10-05 ENCOUNTER — Ambulatory Visit: Payer: Medicare HMO | Admitting: Dermatology

## 2021-10-05 DIAGNOSIS — L570 Actinic keratosis: Secondary | ICD-10-CM | POA: Diagnosis not present

## 2021-10-05 DIAGNOSIS — L578 Other skin changes due to chronic exposure to nonionizing radiation: Secondary | ICD-10-CM

## 2021-10-05 DIAGNOSIS — L219 Seborrheic dermatitis, unspecified: Secondary | ICD-10-CM

## 2021-10-05 NOTE — Patient Instructions (Addendum)
Cryotherapy Aftercare  Wash gently with soap and water everyday.   Apply Vaseline and Band-Aid daily until healed.    Instructions for [facility] Medications  One or more of your medications was sent to the [facility] mail order compounding pharmacy. You will receive an email from them and can purchase the medicine through that link. It will then be mailed to your home at the address you confirmed. If for any reason you do not receive an email from them, please check your spam folder. If you still do not find the email, please let us know. [facility] phone number is [phone].    Decrease fluocinolone oil 2 times a week to scalp.  Due to recent changes in healthcare laws, you may see results of your pathology and/or laboratory studies on MyChart before the doctors have had a chance to review them. We understand that in some cases there may be results that are confusing or concerning to you. Please understand that not all results are received at the same time and often the doctors may need to interpret multiple results in order to provide you with the best plan of care or course of treatment. Therefore, we ask that you please give [place] 2 business days to thoroughly review all your results before contacting the office for clarification. Should we see a critical lab result, you will be contacted sooner.   If You Need Anything After Your Visit  If you have any questions or concerns for your doctor, please call our main line at [phone] and press option 4 to reach your doctor's medical assistant. If no one answers, please leave a voicemail as directed and we will return your call as soon as possible. Messages left after 4 pm will be answered the following business day.   You may also send [place] a message via [place]. We typically respond to MyChart messages within 1-2 business days.  For prescription refills, please ask your pharmacy to contact our office. Our fax number is  [phone].  If you have an urgent issue when the clinic is closed that cannot wait until the next business day, you can page your doctor at the number below.    Please note that while we do our best to be available for urgent issues outside of office hours, we are not available 24/7.   If you have an urgent issue and are unable to reach [place], you may choose to seek medical care at your doctor's office, retail clinic, urgent care center, or emergency room.  If you have a medical emergency, please immediately call 911 or go to the emergency department.  Pager Numbers  - Dr. Nehemiah Massed: [phone]  - Dr. Laurence Ferrari: [phone]  - Dr. Nicole Kindred: [phone]  In the event of inclement weather, please call our main line at [phone] for an update on the status of any delays or closures.  Dermatology Medication Tips: Please keep the boxes that topical medications come in in order to help keep track of the instructions about where and how to use these. Pharmacies typically print the medication instructions only on the boxes and not directly on the medication tubes.   If your medication is too expensive, please contact our office at [phone] option 4 or send [place] a message through [place].   We are unable to tell what your co-pay for medications will be in advance as this is different depending on your insurance coverage. However, we may be able to find a substitute medication at lower  cost or fill out paperwork to get insurance to cover a needed medication.   If a prior authorization is required to get your medication covered by your insurance company, please allow [place] 1-2 business days to complete this process.  Drug prices often vary depending on where the prescription is filled and some pharmacies may offer cheaper prices.  The website www.goodrx.com contains coupons for medications through different pharmacies. The prices here do not account for what the cost may be with help from  insurance (it may be cheaper with your insurance), but the website can give you the price if you did not use any insurance.  - You can print the associated coupon and take it with your prescription to the pharmacy.  - You may also stop by our office during regular business hours and pick up a GoodRx coupon card.  - If you need your prescription sent electronically to a different pharmacy, notify our office through [facility] or by phone at [phone] option 4.     Si Usted Necesita Algo Despus de Su Visita  Tambin puede enviarnos un mensaje a travs de Pharmacist, community. Por lo general respondemos a los mensajes de MyChart en el transcurso de 1 a 2 das hbiles.  Para renovar recetas, por favor pida a su farmacia que se ponga en contacto con nuestra oficina. Harland Dingwall de fax es [place] [phone].  Si tiene un asunto urgente cuando la clnica est cerrada y que no puede esperar hasta el siguiente da hbil, puede llamar/localizar a su doctor(a) al nmero que aparece a continuacin.   Por favor, tenga en cuenta que aunque hacemos todo lo posible para estar disponibles para asuntos urgentes fuera del horario de [place], no estamos disponibles las 24 horas del da, los 7 das de la [place].   Si tiene un problema urgente y no puede comunicarse con nosotros, puede optar por buscar atencin mdica  en el consultorio de su doctor(a), en una clnica privada, en un centro de atencin urgente o en una sala de emergencias.  Si tiene Engineering geologist, por favor llame inmediatamente al 911 o vaya a la sala de emergencias.  Nmeros de bper  - Dr. Nehemiah Massed: [phone]  - Dra. Moye: [phone]  - Dra. Nicole Kindred: [phone]  En caso de inclemencias del [place], por favor llame a Johnsie Kindred principal al [phone] para una actualizacin sobre el [place] de cualquier retraso o cierre.  Consejos para la medicacin en dermatologa: Por favor, guarde las cajas en las que vienen los  medicamentos de uso tpico para ayudarle a seguir las instrucciones sobre dnde y cmo usarlos. Las farmacias generalmente imprimen las instrucciones del medicamento slo en las cajas y no directamente en los tubos del [place].   Si su medicamento es muy caro, por favor, pngase en contacto con Zigmund Daniel llamando al [phone] y presione la opcin 4 o envenos un mensaje a travs de Pharmacist, community.   No podemos decirle cul ser su copago por los medicamentos por adelantado ya que esto es diferente dependiendo de la cobertura de su seguro. Sin embargo, es posible que podamos encontrar un medicamento sustituto a Electrical engineer un formulario para que el seguro cubra el medicamento que se considera necesario.   Si se requiere una autorizacin previa para que su compaa de seguros [place] su medicamento, por favor permtanos de 1 a 2 das hbiles para completar este proceso.  Los precios de los medicamentos varan con frecuencia dependiendo del Environmental consultant de dnde se surte  la receta y alguna farmacias pueden ofrecer precios ms baratos.  El sitio web www.goodrx.com tiene cupones para medicamentos de Airline pilot. Los precios aqu no tienen en cuenta lo que podra costar con la ayuda del seguro (puede ser ms barato con su seguro), pero el sitio web puede darle el precio si no utiliz Research scientist (physical sciences).  - Puede imprimir el cupn correspondiente y llevarlo con su receta a la farmacia.  - Tambin puede pasar por nuestra oficina durante el horario de atencin regular y Charity fundraiser una tarjeta de cupones de GoodRx.  - Si necesita que su receta se enve electrnicamente a una farmacia diferente, informe a nuestra oficina a travs de MyChart de [place] o por telfono llamando al [phone] y presione la opcin 4.

## 2021-10-05 NOTE — Progress Notes (Signed)
Follow-Up Visit   Subjective  Sheryl Kennedy is a 76 y.o. female who presents for the following: Follow-up.  Patient presents for 3 week follow-up Seborrheic dermatitis vs Aks of the scalp. She is using clobetasol solution, Derma-Smoothe oil, and ketoconazole shampoo. Scalp has improved, but she still has a few scaly spots.  The following portions of the chart were reviewed this encounter and updated as appropriate:       Review of Systems:  No other skin or systemic complaints except as noted in HPI or Assessment and Plan.  Objective  Well appearing patient in no apparent distress; mood and affect are within normal limits.  A focused examination was performed including face, scalp. Relevant physical exam findings are noted in the Assessment and Plan.  Scalp Mild erythema and focal scaling  Vertex Scalp (6) Keratotic macules.    Assessment & Plan  Actinic Damage - chronic, secondary to cumulative UV radiation exposure/sun exposure over time - diffuse scaly erythematous macules with underlying dyspigmentation - Recommend daily broad spectrum sunscreen SPF 30+ to sun-exposed areas, reapply every 2 hours as needed.  - Recommend staying in the shade or wearing long sleeves, sun glasses (UVA+UVB protection) and wide brim hats (4-inch brim around the entire circumference of the hat). - Call for new or changing lesions.  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 Condition is symptomatic/ bothersome to patient. Improving but not currently at goal.  Seborrheic Dermatitis  -  is a chronic persistent rash characterized by pinkness and scaling most commonly of the mid face but also can occur on the scalp (dandruff), ears; mid chest, mid back and groin.  It tends to be exacerbated by stress and cooler weather.  People who have neurologic disease may experience new onset or exacerbation of  existing seborrheic dermatitis.  The condition is not curable but treatable and can be controlled.  Continue Clobetasol 0.05 % external solution - apply topically to scalp in morning after shampoo qd for itchy scalp. Avoid face.   Continue Ketoconazole 2 % shampoo - apply topically as directed. Wash scalp 2 - 3 times weekly. Let sit 5 minutes and rinse out.    Decrease Fluocinolone Acetonide body 0.01 % oil - apply topically to crown of scalp 2x/wk, cover with shower cap nightly before bed. Wash out in AM.   Topical steroids (such as triamcinolone, fluocinolone, fluocinonide, mometasone, clobetasol, halobetasol, betamethasone, hydrocortisone) can cause thinning and lightening of the skin if they are used for too long in the same area. Your physician has selected the right strength medicine for your problem and area affected on the body. Please use your medication only as directed by your physician to prevent side effects.    Related Medications Fluocinolone Acetonide Body 0.01 % OIL Apply topically to itchy scaly spot at scalp cover with shower cap qhs  clobetasol (TEMOVATE) 0.05 % external solution Apply topically to scalp in morning after shampoo hair qd for itchy scalp  ketoconazole (NIZORAL) 2 % shampoo Apply 1 Application topically as directed. Wash scalp 2-3 times weekly, Let sit 5 minutes and rinse out  Hypertrophic actinic keratosis (6) Vertex Scalp  Actinic keratoses are precancerous spots that appear secondary to cumulative UV radiation exposure/sun exposure over time. They are chronic with expected duration over 1 year. A portion of actinic keratoses will progress to squamous cell carcinoma of the skin. It is not possible to reliably predict which spots  will progress to skin cancer and so treatment is recommended to prevent development of skin cancer.  Recommend daily broad spectrum sunscreen SPF 30+ to sun-exposed areas, reapply every 2 hours as needed.  Recommend staying in  the shade or wearing long sleeves, sun glasses (UVA+UVB protection) and wide brim hats (4-inch brim around the entire circumference of the hat). Call for new or changing lesions.  Destruction of lesion - Vertex Scalp  Destruction method: cryotherapy   Informed consent: discussed and consent obtained   Lesion destroyed using liquid nitrogen: Yes   Region frozen until ice ball extended beyond lesion: Yes   Outcome: patient tolerated procedure well with no complications   Post-procedure details: wound care instructions given   Additional details:  Prior to procedure, discussed risks of blister formation, small wound, skin dyspigmentation, or rare scar following cryotherapy. Recommend Vaseline ointment to treated areas while healing.    Return in about 2 months (around 12/05/2021) for AKs, [facility] Derm.  IJamesetta Orleans, CMA, am acting as scribe for Brendolyn Patty, MD .  Documentation: I have reviewed the above documentation for accuracy and completeness, and I agree with the above.  Brendolyn Patty MD

## 2021-10-25 ENCOUNTER — Emergency Department: Payer: Medicare HMO

## 2021-10-25 ENCOUNTER — Encounter: Payer: Self-pay | Admitting: Physician Assistant

## 2021-10-25 ENCOUNTER — Other Ambulatory Visit: Payer: Self-pay

## 2021-10-25 ENCOUNTER — Inpatient Hospital Stay (HOSPITAL_BASED_OUTPATIENT_CLINIC_OR_DEPARTMENT_OTHER)
Admit: 2021-10-25 | Discharge: 2021-10-25 | Disposition: A | Discharge status: Home / Self Care | Payer: Medicare HMO | Attending: Internal Medicine | Admitting: Internal Medicine

## 2021-10-25 ENCOUNTER — Observation Stay
Admission: EM | Admit: 2021-10-25 | Discharge: 2021-10-26 | Disposition: A | Discharge status: Home / Self Care | Payer: Medicare HMO | Attending: Internal Medicine | Admitting: Internal Medicine

## 2021-10-25 ENCOUNTER — Encounter
Admission: EM | Disposition: A | Discharge status: Home / Self Care | Payer: Self-pay | Source: Home / Self Care | Attending: Emergency Medicine

## 2021-10-25 DIAGNOSIS — I309 Acute pericarditis, unspecified: Principal | ICD-10-CM | POA: Diagnosis present

## 2021-10-25 DIAGNOSIS — I319 Disease of pericardium, unspecified: Secondary | ICD-10-CM | POA: Diagnosis present

## 2021-10-25 DIAGNOSIS — E785 Hyperlipidemia, unspecified: Secondary | ICD-10-CM | POA: Diagnosis not present

## 2021-10-25 DIAGNOSIS — Z79899 Other long term (current) drug therapy: Secondary | ICD-10-CM | POA: Insufficient documentation

## 2021-10-25 DIAGNOSIS — N184 Chronic kidney disease, stage 4 (severe): Secondary | ICD-10-CM | POA: Diagnosis not present

## 2021-10-25 DIAGNOSIS — I213 ST elevation (STEMI) myocardial infarction of unspecified site: Secondary | ICD-10-CM | POA: Insufficient documentation

## 2021-10-25 DIAGNOSIS — I2510 Atherosclerotic heart disease of native coronary artery without angina pectoris: Secondary | ICD-10-CM | POA: Diagnosis not present

## 2021-10-25 DIAGNOSIS — Z96651 Presence of right artificial knee joint: Secondary | ICD-10-CM | POA: Diagnosis not present

## 2021-10-25 DIAGNOSIS — Z86718 Personal history of other venous thrombosis and embolism: Secondary | ICD-10-CM | POA: Diagnosis not present

## 2021-10-25 DIAGNOSIS — Z85828 Personal history of other malignant neoplasm of skin: Secondary | ICD-10-CM | POA: Insufficient documentation

## 2021-10-25 DIAGNOSIS — Z7982 Long term (current) use of aspirin: Secondary | ICD-10-CM | POA: Insufficient documentation

## 2021-10-25 DIAGNOSIS — I129 Hypertensive chronic kidney disease with stage 1 through stage 4 chronic kidney disease, or unspecified chronic kidney disease: Secondary | ICD-10-CM | POA: Diagnosis not present

## 2021-10-25 DIAGNOSIS — E876 Hypokalemia: Secondary | ICD-10-CM | POA: Diagnosis not present

## 2021-10-25 DIAGNOSIS — D72829 Elevated white blood cell count, unspecified: Secondary | ICD-10-CM | POA: Diagnosis not present

## 2021-10-25 DIAGNOSIS — N179 Acute kidney failure, unspecified: Secondary | ICD-10-CM

## 2021-10-25 DIAGNOSIS — R079 Chest pain, unspecified: Secondary | ICD-10-CM | POA: Diagnosis not present

## 2021-10-25 DIAGNOSIS — I10 Essential (primary) hypertension: Secondary | ICD-10-CM | POA: Diagnosis present

## 2021-10-25 DIAGNOSIS — N1832 Chronic kidney disease, stage 3b: Secondary | ICD-10-CM

## 2021-10-25 DIAGNOSIS — N189 Chronic kidney disease, unspecified: Secondary | ICD-10-CM

## 2021-10-25 HISTORY — PX: LEFT HEART CATH AND CORONARY ANGIOGRAPHY: CATH118249

## 2021-10-25 LAB — ECHOCARDIOGRAM COMPLETE
AR max vel: 2.32 cm2
AV Area VTI: 2.09 cm2
AV Area mean vel: 2.13 cm2
AV Mean grad: 2 mmHg
AV Peak grad: 3.6 mmHg
Ao pk vel: 0.95 m/s
Area-P 1/2: 3.34 cm2
Height: 60 in
S' Lateral: 1.8 cm
Weight: 2338.64 oz

## 2021-10-25 LAB — BASIC METABOLIC PANEL
Anion gap: 11 (ref 5–15)
BUN: 54 mg/dL — ABNORMAL HIGH (ref 8–23)
CO2: 27 mmol/L (ref 22–32)
Calcium: 10.1 mg/dL (ref 8.9–10.3)
Chloride: 96 mmol/L — ABNORMAL LOW (ref 98–111)
Creatinine, Ser: 1.96 mg/dL — ABNORMAL HIGH (ref 0.44–1.00)
GFR, Estimated: 26 mL/min — ABNORMAL LOW (ref >60)
Glucose, Bld: 109 mg/dL — ABNORMAL HIGH (ref 70–99)
Potassium: 4 mmol/L (ref 3.5–5.1)
Sodium: 134 mmol/L — ABNORMAL LOW (ref 135–145)

## 2021-10-25 LAB — CBC
HCT: 39.5 % (ref 36.0–46.0)
Hemoglobin: 13 g/dL (ref 12.0–15.0)
MCH: 31.1 pg (ref 26.0–34.0)
MCHC: 32.9 g/dL (ref 30.0–36.0)
MCV: 94.5 fL (ref 80.0–100.0)
Platelets: 233 10*3/uL (ref 150–400)
RBC: 4.18 MIL/uL (ref 3.87–5.11)
RDW: 15.8 % — ABNORMAL HIGH (ref 11.5–15.5)
WBC: 17.9 10*3/uL — ABNORMAL HIGH (ref 4.0–10.5)
nRBC: 0 % (ref 0.0–0.2)

## 2021-10-25 LAB — TROPONIN I (HIGH SENSITIVITY)
Troponin I (High Sensitivity): 8 ng/L (ref <18)
Troponin I (High Sensitivity): 8 ng/L (ref <18)

## 2021-10-25 LAB — SEDIMENTATION RATE: Sed Rate: 33 mm/hr — ABNORMAL HIGH (ref 0–30)

## 2021-10-25 LAB — C-REACTIVE PROTEIN: CRP: 24.3 mg/dL — ABNORMAL HIGH (ref <1.0)

## 2021-10-25 LAB — MRSA NEXT GEN BY PCR, NASAL: MRSA by PCR Next Gen: NOT DETECTED

## 2021-10-25 SURGERY — LEFT HEART CATH AND CORONARY ANGIOGRAPHY
Anesthesia: Moderate Sedation

## 2021-10-25 MED ORDER — HEPARIN (PORCINE) IN NACL 1000-0.9 UT/500ML-% IV SOLN
INTRAVENOUS | Status: DC | PRN
  Administered 2021-10-25 (×2): 500 mL

## 2021-10-25 MED ORDER — FENTANYL CITRATE (PF) 100 MCG/2ML IJ SOLN
INTRAMUSCULAR | Status: AC
  Filled 2021-10-25: qty 2

## 2021-10-25 MED ORDER — ALLOPURINOL 300 MG PO TABS
300.0000 mg | ORAL_TABLET | Freq: Every day | ORAL | Status: DC
  Administered 2021-10-26: 300 mg via ORAL
  Filled 2021-10-25: qty 1

## 2021-10-25 MED ORDER — INFLUENZA VAC A&B SA ADJ QUAD 0.5 ML IM PRSY
0.5000 mL | PREFILLED_SYRINGE | INTRAMUSCULAR | Status: DC
  Filled 2021-10-25: qty 0.5

## 2021-10-25 MED ORDER — ONDANSETRON HCL 4 MG/2ML IJ SOLN: 4.0000 mg | Freq: Four times a day (QID) | INTRAMUSCULAR | Status: DC | PRN

## 2021-10-25 MED ORDER — MIDAZOLAM HCL 2 MG/2ML IJ SOLN
INTRAMUSCULAR | Status: AC
  Filled 2021-10-25: qty 2

## 2021-10-25 MED ORDER — MIDAZOLAM HCL 2 MG/2ML IJ SOLN
INTRAMUSCULAR | Status: DC | PRN
  Administered 2021-10-25: .5 mg via INTRAVENOUS

## 2021-10-25 MED ORDER — SIMVASTATIN 20 MG PO TABS
20.0000 mg | ORAL_TABLET | Freq: Every day | ORAL | Status: DC
  Administered 2021-10-25: 20 mg via ORAL
  Filled 2021-10-25: qty 1

## 2021-10-25 MED ORDER — ENOXAPARIN SODIUM 30 MG/0.3ML IJ SOSY
30.0000 mg | PREFILLED_SYRINGE | INTRAMUSCULAR | Status: DC
  Administered 2021-10-26: 30 mg via SUBCUTANEOUS
  Filled 2021-10-25: qty 0.3

## 2021-10-25 MED ORDER — ACETAMINOPHEN 325 MG PO TABS: 650.0000 mg | ORAL_TABLET | ORAL | Status: DC | PRN

## 2021-10-25 MED ORDER — VERAPAMIL HCL 2.5 MG/ML IV SOLN
INTRAVENOUS | Status: AC
  Filled 2021-10-25: qty 2

## 2021-10-25 MED ORDER — LIDOCAINE HCL 1 % IJ SOLN
INTRAMUSCULAR | Status: AC
  Filled 2021-10-25: qty 20

## 2021-10-25 MED ORDER — SODIUM CHLORIDE 0.9 % IV SOLN: 250.0000 mL | INTRAVENOUS | Status: DC | PRN

## 2021-10-25 MED ORDER — SODIUM CHLORIDE 0.9% FLUSH: 3.0000 mL | INTRAVENOUS | Status: DC | PRN

## 2021-10-25 MED ORDER — SODIUM CHLORIDE 0.9 % IV SOLN
INTRAVENOUS | Status: DC | PRN
  Administered 2021-10-25: 250 mL via INTRAVENOUS

## 2021-10-25 MED ORDER — COLCHICINE 0.3 MG HALF TABLET
0.3000 mg | ORAL_TABLET | Freq: Every day | ORAL | Status: DC
  Administered 2021-10-25 – 2021-10-26 (×2): 0.3 mg via ORAL
  Filled 2021-10-25 (×2): qty 1

## 2021-10-25 MED ORDER — HEPARIN (PORCINE) IN NACL 1000-0.9 UT/500ML-% IV SOLN
INTRAVENOUS | Status: AC
  Filled 2021-10-25: qty 1000

## 2021-10-25 MED ORDER — SODIUM CHLORIDE 0.9% FLUSH
3.0000 mL | Freq: Two times a day (BID) | INTRAVENOUS | Status: DC
  Administered 2021-10-26: 3 mL via INTRAVENOUS

## 2021-10-25 MED ORDER — VITAMIN B-12 1000 MCG PO TABS
1000.0000 ug | ORAL_TABLET | Freq: Every day | ORAL | Status: DC
  Administered 2021-10-26: 1000 ug via ORAL
  Filled 2021-10-25: qty 1

## 2021-10-25 MED ORDER — PNEUMOCOCCAL 20-VAL CONJ VACC 0.5 ML IM SUSY: 0.5000 mL | PREFILLED_SYRINGE | INTRAMUSCULAR | Status: DC

## 2021-10-25 MED ORDER — CHLORHEXIDINE GLUCONATE CLOTH 2 % EX PADS
6.0000 | MEDICATED_PAD | Freq: Every day | CUTANEOUS | Status: DC
  Administered 2021-10-25: 6 via TOPICAL

## 2021-10-25 MED ORDER — ORAL CARE MOUTH RINSE: 15.0000 mL | OROMUCOSAL | Status: DC | PRN

## 2021-10-25 MED ORDER — HEPARIN SODIUM (PORCINE) 1000 UNIT/ML IJ SOLN
INTRAMUSCULAR | Status: DC | PRN
  Administered 2021-10-25: 3000 [IU] via INTRAVENOUS

## 2021-10-25 MED ORDER — IOHEXOL 300 MG/ML  SOLN
INTRAMUSCULAR | Status: DC | PRN
  Administered 2021-10-25: 40 mL

## 2021-10-25 MED ORDER — VERAPAMIL HCL 2.5 MG/ML IV SOLN
INTRAVENOUS | Status: DC | PRN
  Administered 2021-10-25 (×2): 2.5 mg via INTRA_ARTERIAL

## 2021-10-25 MED ORDER — LEVOTHYROXINE SODIUM 50 MCG PO TABS
50.0000 ug | ORAL_TABLET | Freq: Every day | ORAL | Status: DC
  Administered 2021-10-26: 50 ug via ORAL
  Filled 2021-10-25: qty 1

## 2021-10-25 MED ORDER — HEPARIN SODIUM (PORCINE) 1000 UNIT/ML IJ SOLN
INTRAMUSCULAR | Status: AC
  Filled 2021-10-25: qty 10

## 2021-10-25 MED ORDER — SODIUM CHLORIDE 0.9 % IV SOLN: INTRAVENOUS | Status: DC

## 2021-10-25 MED ORDER — LACTATED RINGERS IV SOLN: INTRAVENOUS | Status: AC

## 2021-10-25 MED ORDER — HYDRALAZINE HCL 20 MG/ML IJ SOLN: 10.0000 mg | INTRAMUSCULAR | Status: AC | PRN

## 2021-10-25 MED ORDER — DOCUSATE SODIUM 100 MG PO CAPS: 100.0000 mg | ORAL_CAPSULE | Freq: Every day | ORAL | Status: DC

## 2021-10-25 MED ORDER — LIDOCAINE HCL (PF) 1 % IJ SOLN
INTRAMUSCULAR | Status: DC | PRN
  Administered 2021-10-25: 2 mL

## 2021-10-25 MED ORDER — FENTANYL CITRATE (PF) 100 MCG/2ML IJ SOLN
INTRAMUSCULAR | Status: DC | PRN
Start: 2021-10-25 — End: 2021-10-25
  Administered 2021-10-25: 12.5 ug via INTRAVENOUS

## 2021-10-25 MED ORDER — TRAMADOL HCL 50 MG PO TABS
50.0000 mg | ORAL_TABLET | Freq: Four times a day (QID) | ORAL | Status: DC
  Administered 2021-10-25 – 2021-10-26 (×4): 50 mg via ORAL
  Filled 2021-10-25 (×4): qty 1

## 2021-10-25 SURGICAL SUPPLY — 13 items
BAND ZEPHYR COMPRESS 30 LONG (HEMOSTASIS) IMPLANT
CATH 5F 110X4 TIG (CATHETERS) IMPLANT
CATH INFINITI 5FR ANG PIGTAIL (CATHETERS) IMPLANT
DRAPE BRACHIAL (DRAPES) IMPLANT
GLIDESHEATH SLEND SS 6F .021 (SHEATH) IMPLANT
GUIDEWIRE INQWIRE 1.5J.035X260 (WIRE) IMPLANT
INQWIRE 1.5J .035X260CM (WIRE) ×1
KIT ENCORE 26 ADVANTAGE (KITS) IMPLANT
PACK CARDIAC CATH (CUSTOM PROCEDURE TRAY) ×1 IMPLANT
PROTECTION STATION PRESSURIZED (MISCELLANEOUS) ×1
SET ATX SIMPLICITY (MISCELLANEOUS) IMPLANT
STATION PROTECTION PRESSURIZED (MISCELLANEOUS) IMPLANT
WIRE HITORQ VERSACORE ST 145CM (WIRE) IMPLANT

## 2021-10-25 NOTE — Brief Op Note (Signed)
BRIEF CARDIAC CATHETERIZATION NOTE  10/25/2021  3:17 PM  PATIENT:  Sheryl Kennedy  76 y.o. 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 Normal left ventricular systolic function and filling pressure (LVEF greater than 65%, LVEDP 12 mmHg).  RECOMMENDATIONS: Suspect acute pericarditis.  Obtain echocardiogram and initiate colchicine. Medical therapy and risk factor modification to prevent progression of mild CAD.  Nelva Bush, MD [facility]

## 2021-10-25 NOTE — Assessment & Plan Note (Addendum)
Creatinine elevated on admission at 1.96.  Baseline between 1.5 and 1.7.  Likely in the setting of poor p.o. intake leading to dehydration and hypotension.  -Hold nephrotoxic agents including home spironolactone-hydrochlorothiazide, torsemide and ibuprofen - IV fluid resuscitation with LR - Repeat BMP in the morning

## 2021-10-25 NOTE — ED Notes (Signed)
Dr. End at bedside. 

## 2021-10-25 NOTE — Assessment & Plan Note (Signed)
Patient presenting with 3-day history of central upper chest pain with radiation into both shoulders, shortness of breath, in addition to nausea, diarrhea, malaise.  Initial EKG with inferior lead ST elevation concerning for STEMI.  Patient underwent urgent left heart cath with no evidence of obstructive CAD.    I suspect the etiology may be viral in nature given pain onset was with nausea, malaise and diarrhea.  Given she is no longer experiencing diarrhea, no indication for stool testing.  BUN is elevated, however seems near baseline, so low suspicion for uremic pericarditis.  Per chart review, no prior history of autoimmune disorders., although she did have a positive ANA in 2015 that was checked for foot pain. RA factor and CCP antibodies negative in July 2023.  - Cardiology following; appreciate their recommendations - Continue colchicine 0.3 mg daily - Echocardiogram pending - ESR and CRP pending

## 2021-10-25 NOTE — ED Notes (Signed)
Pt states she took ASA '325mg'$  prior to her arrival.

## 2021-10-25 NOTE — ED Triage Notes (Signed)
Pt via POV from home. Pt was at [facility]. Pt c/o mid sternal chest pain since Friday night. Pt states she also having some SOB. Pt states that movement makes it worse. Pt is A&Ox4 and NAD

## 2021-10-25 NOTE — ED Provider Notes (Signed)
[facility] Provider Note    Event Date/Time   First MD Initiated Contact with Patient 10/25/21 1354     (approximate)   History   Chief Complaint Chest Pain   HPI  Sheryl Kennedy is a 76 y.o. female with past medical history of hypertension, hyperlipidemia, and CKD who presents to the ED complaint of chest pain.  Patient reports that she initially developed pressure in her chest after eating a large meal on Friday night.  She states that the pain was severe at that time, but seem to be better after she woke up the next morning.  She felt better for much of the day on Saturday but dealt with recurrent pain yesterday as well as today.  She continues to describe the pain as a pressure that is worse with any exertion.  She also reports feeling out of breath with exertion, denies any associated fevers or cough.  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  She continues to have pain now that she rates as a 5 out of 10.  She took 325 mg of aspirin prior to arrival, denies any significant cardiac history.     Physical Exam   Triage Vital Signs: ED Triage Vitals  Enc Vitals Group     BP 10/25/21 1400 111/83     Pulse Rate 10/25/21 1400 84     Resp 10/25/21 1400 18     Temp --      Temp src --      SpO2 10/25/21 1400 94 %     Weight 10/25/21 1357 140 lb (63.5 kg)     Height 10/25/21 1357 5' (1.524 m)     Head Circumference --      Peak Flow --      Pain Score 10/25/21 1357 5     Pain Loc --      Pain Edu? --      Excl. in [place]? --     Most recent vital signs: Vitals:   10/25/21 1400 10/25/21 1429  BP: 111/83   Pulse: 84   Resp: 18   SpO2: 94% 94%    Constitutional: Alert and oriented. Eyes: Conjunctivae are normal. Head: Atraumatic. Nose: No congestion/rhinnorhea. Mouth/Throat: Mucous membranes are moist.  Cardiovascular: Normal rate, regular rhythm. Grossly normal  heart sounds.  2+ radial pulses bilaterally. Respiratory: Normal respiratory effort.  No retractions. Lungs CTAB.  No chest wall tenderness to palpation. Gastrointestinal: Soft and nontender. No distention. Musculoskeletal: No lower extremity tenderness nor edema.  Neurologic:  Normal speech and language. No gross focal neurologic deficits are appreciated.    ED Results / Procedures / Treatments   Labs (all labs ordered are listed, but only abnormal results are displayed) Labs Reviewed  BASIC METABOLIC PANEL - Abnormal; Notable for the following components:      Result Value   Sodium 134 (*)    Chloride 96 (*)    Glucose, Bld 109 (*)    BUN 54 (*)    Creatinine, Ser 1.96 (*)    GFR, Estimated 26 (*)    All other components within normal limits  CBC - Abnormal; Notable for the following components:   WBC 17.9 (*)    RDW 15.8 (*)    All other components within normal limits  TROPONIN I (HIGH SENSITIVITY)     EKG  ED ECG REPORT I, Blake Divine,  the attending physician, personally viewed and interpreted this ECG.   Date: 10/25/2021  EKG Time: 13:50  Rate: 83  Rhythm: normal sinus rhythm  Axis: Normal  Intervals:none  ST&T Change: ST elevation in inferior and lateral leads noted  ED ECG REPORT I, Blake Divine, the attending physician, personally viewed and interpreted this ECG.   Date: 10/25/2021  EKG Time: 14:01  Rate: 80  Rhythm: normal sinus rhythm  Axis: Normal  Intervals:none  ST&T Change: Inferolateral ST elevation, worsening compared to previous   PROCEDURES:  Critical Care performed: Yes, see critical care procedure note(s)  .Critical Care  Performed by: Blake Divine, MD Authorized by: Blake Divine, MD   Critical care provider statement:    Critical care time (minutes):  30   Critical care time was exclusive of:  Separately billable procedures and treating other patients and teaching time   Critical care was necessary to treat or prevent  imminent or life-threatening deterioration of the following conditions:  Cardiac failure   Critical care was time spent personally by me on the following activities:  Development of treatment plan with patient or surrogate, discussions with consultants, evaluation of patient's response to treatment, examination of patient, ordering and review of laboratory studies, ordering and review of radiographic studies, ordering and performing treatments and interventions, pulse oximetry, re-evaluation of patient's condition and review of old charts   I assumed direction of critical care for this patient from another provider in my specialty: no     Care discussed with: admitting provider      MEDICATIONS ORDERED IN ED: Medications  Heparin (Porcine) in NaCl 1000-0.9 UT/500ML-% SOLN (500 mLs  Given 10/25/21 1430)  lidocaine (PF) (XYLOCAINE) 1 % injection (2 mLs  Given 10/25/21 1432)  midazolam (VERSED) injection (0.5 mg Intravenous Given 10/25/21 1433)  fentaNYL (SUBLIMAZE) injection (12.5 mcg Intravenous Given 10/25/21 1433)  verapamil (ISOPTIN) injection (2.5 mg Intra-arterial Given 10/25/21 1453)  0.9 %  sodium chloride infusion (250 mLs Intravenous New Bag/Given 10/25/21 1447)  heparin sodium (porcine) injection (3,000 Units Intravenous Given 10/25/21 1439)  iohexol (OMNIPAQUE) 300 MG/ML solution (40 mLs  Given 10/25/21 1458)     IMPRESSION / MDM / [place] / ED COURSE  I reviewed the triage vital signs and the nursing notes.                              76 y.o. female with past medical history of hypertension, hyperlipidemia, and CKD who presents to the ED complaining of intermittent chest pain for the past 3 days which has been associated with exertion.  Patient's presentation is most consistent with acute presentation with potential threat to life or bodily function.  Differential diagnosis includes, but is not limited to, ACS, PE, pneumonia, dissection, pneumothorax,  musculoskeletal pain, pericarditis, GERD.  Patient nontoxic-appearing and in no acute distress, vital signs are unremarkable.  Initial EKG showed small amount of ST elevation inferolaterally but with no reciprocal changes.  Patient was brought back to a room immediately and repeat EKG showed worsening ST elevation, after which code STEMI was activated.  Patient reports taking loading dose of aspirin at home, we will hold off on nitroglycerin given concern for inferior MI.  Dr. Saunders Revel of cardiology is at the bedside and will take patient to the Cath Lab for emergent cardiac catheterization.  Labs today showed chronic kidney disease worse compared to previous with no significant electrolyte abnormality, patient with mild leukocytosis  as well but no significant anemia.      FINAL CLINICAL IMPRESSION(S) / ED DIAGNOSES   Final diagnoses:  ST elevation myocardial infarction (STEMI), unspecified artery ([place])     Rx / DC Orders   ED Discharge Orders     None        Note:  This document was prepared using Dragon voice recognition software and may include unintentional dictation errors.   Blake Divine, MD 10/25/21 [phone]

## 2021-10-25 NOTE — Assessment & Plan Note (Signed)
-   Holding home antihypertensives in the setting of hypotension and AKI.

## 2021-10-25 NOTE — Assessment & Plan Note (Deleted)
Creatinine elevated on admission at 1.96.  Baseline between 1.5 and 1.7.  I wonder if

## 2021-10-25 NOTE — Progress Notes (Signed)
   10/25/21 1500  Clinical Encounter Type  Visited With Health care provider  Visit Type Initial  Referral From Nurse  Consult/Referral To Chaplain   Chaplain responded to code stemi. Patient taken to cath lab. Chaplain is available for follow up as needed.

## 2021-10-25 NOTE — H&P (Signed)
History and Physical    Patient: Sheryl Kennedy QZR:[id number] DOB: 04-Oct-1945 DOA: 10/25/2021 DOS: the patient was seen and examined on 10/25/2021 PCP: Idelle Crouch, MD  Patient coming from: Home  Chief Complaint:  Chief Complaint  Patient presents with   Chest Pain   HPI: Sheryl Kennedy is a 76 y.o. female with medical history significant of hypertension, hyperlipidemia, gout, CKD stage IIIb, who presents to the ED with complaints of chest pain.  Sheryl Kennedy states she was in her usual state of health until Friday, October 20 when she went out for a celebratory dinner where she ate a heavy meal.  Afterwards, she developed nausea with no vomiting, malaise and central chest pain with radiation up to bilateral shoulders.  The pain was not substernal or pressure-like.  She also was experiencing shortness of breath at that time that was not worse with any certain positions.  At this point, she took 3 aspirins and noticed improvement in her symptoms by the next day.  On October 21, she was no longer experiencing nausea or chest pain but did have approximately 5-6 episodes of diarrhea that was nonbloody, nonmelanotic.  On October 22, she began to develop the same chest pain and shortness of breath once again.  Today, she noted her shortness of breath and chest pain became severe, and so she went for evaluation at the [facility].  She was sent immediately to the ED.  At the time of our conversation, Sheryl Kennedy states she is unsure if her chest pain changed in any certain position, however she did inform cardiology that chest pain was worse with laying flat and deep breaths.  Mrs. Sheryl Kennedy states proximately 2-3 weeks ago, she was experiencing nasal congestion and cough, but suspected it was secondary to seasonal allergies.  She denies any recent fever, chills.  ED Course On arrival to the ED, blood pressure was initially 111/83 before decreasing to 87/57.  Heart rate between 75-85.  She was  saturating at 94% on room air but placed on 2 L nasal cannula due to dyspnea.Initial blood work demonstrated elevated white blood count at 17.9, sodium of 134, glucose of 109, BUN of 54, and creatinine of 1.96.  Troponin negative x2 at 8.  EKG was obtained that showed ST elevation in the inferior leads.  Cardiology was consulted urgently for STEMI and patient was taken immediately for left heart cath.  No evidence of obstructive CAD on left heart cath and given positional changes and chest pain, acute pericarditis was suspected.  TR[facility] contacted for admission by cardiology.  Review of Systems: As mentioned in the history of present illness. All other systems reviewed and are negative.  Past Medical History:  Diagnosis Date   Arthritis    Cancer ([place]) 08/2012   skin right anterior lower leg, squamous cell   DSAP (disseminated superficial actinic porokeratosis)    Gout    Hx of seasonal allergies    Hyperlipidemia    Hypertension    Squamous cell carcinoma of skin    R anterior lower leg   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 complications    Past Surgical History:  Procedure Laterality Date   BACK SURGERY  2008   no metal in back   BREAST BIOPSY Right 09/13/2010   core/neg   BREAST BIOPSY Right 12/09/2015   US biopsy/neg   BREAST SURGERY Right 2012   CHONDROPLASTY Left 01/22/2016   Procedure: arthroscopic medial AND LATERAL CHONDROPLASTY;  Surgeon: Jeneen Rinks  Vira Blanco, MD;  Location: [facility] ORS;  Service: Orthopedics;  Laterality: Left;   COLONOSCOPY  2011   COLONOSCOPY WITH PROPOFOL N/A 04/14/2015   Procedure: COLONOSCOPY WITH PROPOFOL;  Surgeon: Lollie Sails, MD;  [facility] ENDOSCOPY;  Service: Endoscopy;  Laterality: N/A;   CYST REMOVAL HAND Left 2012   JOINT REPLACEMENT Right 09/30/2013   TOTAL KNEE REPLACEMENT, DR. Marry Guan, [place] ARTHROSCOPY WITH MEDIAL MENISECTOMY Left 01/22/2016   Procedure: KNEE ARTHROSCOPY WITH MEDIAL AND LATERAL MENISECTOMY;  Surgeon: Dereck Leep, MD;  Location: [facility] ORS;  Service: Orthopedics;  Laterality: Left;   KNEE SURGERY  1997   Stab pheblectomy  Left 2013   TONSILLECTOMY     VEIN CLOSURE Bilateral 2012   Social History:  reports that she has never smoked. She has never used smokeless tobacco. She reports current alcohol use. She reports that she does not use drugs.  Allergies  Allergen Reactions   Naproxen Swelling    In hands and joints   Pseudoephedrine Rash and Other (See Comments)    Rhinitis and sneezing Rhinitis and sneezing Rhinitis and sneezing   Shellfish Allergy Other (See Comments)    Joint swelling, and "gout"     Sulfa Antibiotics Rash    Family History  Problem Relation Age of Onset   Heart failure Mother    Heart disease Mother    Arthritis Mother    Colon cancer Paternal Grandmother    Breast cancer Neg Hx     Prior to Admission medications   Medication Sig Start Date End Date Taking? Authorizing Provider  allopurinol (ZYLOPRIM) 300 MG tablet Take 300 mg by mouth daily.    [provider]  aspirin 325 MG tablet Take 325 mg by mouth daily.    [provider]  Calcium Carbonate-Vit D-Min (CALCIUM 600+D PLUS MINERALS PO) Take 1 tablet by mouth daily.    [provider]  Cholecalciferol (VITAMIN D3) 25 MCG (1000 UT) CAPS Take by mouth.    [provider]  clobetasol (TEMOVATE) 0.05 % external solution Apply topically to scalp in morning after shampoo hair qd for itchy scalp 09/14/21   Brendolyn Patty, MD  Cyanocobalamin 1000 MCG TBCR Take 1 tablet by mouth daily.     [provider]  docusate sodium (COLACE) 100 MG capsule Take 100 mg by mouth at bedtime.    [provider]  fexofenadine (ALLEGRA) 180 MG tablet Take 180 mg by mouth daily as needed for allergies.    [provider]  Fluocinolone Acetonide Body 0.01 % OIL Apply topically to itchy scaly spot at scalp cover with shower cap qhs 09/14/21   Brendolyn Patty, MD  fluticasone  [facility]) 50 MCG/ACT nasal spray Place 1 spray into both nostrils daily as needed for allergies or rhinitis.    [provider]  ibuprofen (ADVIL,MOTRIN) 800 MG tablet Take 1 tablet (800 mg total) by mouth every 8 (eight) hours as needed. 04/18/18   Edrick Kins, DPM  ketoconazole (NIZORAL) 2 % shampoo Apply 1 Application topically as directed. Wash scalp 2-3 times weekly, Let sit 5 minutes and rinse out 09/14/21   Brendolyn Patty, MD  levothyroxine (SYNTHROID) 50 MCG tablet Take 50 mcg by mouth daily before breakfast.    [provider]  metoprolol succinate (TOPROL-XL) 25 MG 24 hr tablet Take 25 mg by mouth daily.  04/26/13   [provider]  mupirocin ointment (BACTROBAN) 2 % Apply to affected areas qd/bid prn. 06/07/21  Brendolyn Patty, MD  Nutritional Supplements (ESTROVEN PM PO) Take 0.5 tablets by mouth at bedtime. For hotflashes    [provider]  potassium chloride SA (K-DUR,KLOR-CON) 20 MEQ tablet Take 20 mEq by mouth 3 (three) times daily.  03/28/13   [provider]  simvastatin (ZOCOR) 40 MG tablet Take 20 mg by mouth daily at 6 PM.  04/08/13   [provider]  spironolactone-hydrochlorothiazide (ALDACTAZIDE) 25-25 MG tablet Take 2 tablets by mouth daily. 09/21/16   [provider]  torsemide (DEMADEX) 20 MG tablet Take 20 mg by mouth daily.  04/26/13   [provider]  traMADol (ULTRAM) 50 MG tablet Take 50 mg by mouth 4 (four) times daily.    [provider]    Physical Exam: Vitals:   10/25/21 1630 10/25/21 1635 10/25/21 1700 10/25/21 1730  BP:  101/69 (!) 100/59 96/64  Pulse: 81 (!) 159 73 (!) 126  Resp: '17 20 17 18  ' Temp:      TempSrc:      SpO2: 90% 100% 96% 100%  Weight:      Height:       Physical Exam Vitals and nursing note reviewed.  Constitutional:      General: She is not in acute distress.    Appearance: She is normal weight. She is not toxic-appearing.  HENT:     Head: Normocephalic and  atraumatic.  Eyes:     Extraocular Movements: Extraocular movements intact.  Neck:     Vascular: No JVD.  Cardiovascular:     Rate and Rhythm: Normal rate and regular rhythm.     Heart sounds:     No systolic murmur is present.     No diastolic murmur is present.     No friction rub.  Pulmonary:     Effort: Pulmonary effort is normal. No tachypnea or respiratory distress.     Breath sounds: Rales (Minimal fine crackles heard only in the left middle lung field.) present. No decreased breath sounds, wheezing or rhonchi.  Chest:     Chest wall: Tenderness (Mild tenderness to palpation on the anterior superior right chest wall.) present.  Abdominal:     Palpations: Abdomen is soft.     Tenderness: There is no abdominal tenderness. There is no guarding.  Musculoskeletal:     Cervical back: Normal range of motion and neck supple.     Right lower leg: No edema.     Left lower leg: No edema.  Skin:    General: Skin is warm and dry.  Neurological:     General: No focal deficit present.     Mental Status: She is alert and oriented to person, place, and time.  Psychiatric:        Mood and Affect: Mood normal.        Behavior: Behavior normal.    Data Reviewed: CBC remarkable for white blood count of 17.9.  BMP remarkable for sodium of 134, chloride 96, glucose 109, BUN 54, creatinine 1.96 with GFR of 26.  Troponin within normal limits at 8.  EKG personally reviewed.  Sinus rhythm with a rate of 83.  Right axis deviation.  Evidence of 1 to 2 mm ST elevation in leads II, 3, aVF, V6.  No PR depression noted in lead II.  Results are pending, will review when available.  Assessment and Plan: * Acute pericarditis Patient presenting with 3-day history of central upper chest pain with radiation into both shoulders, shortness of breath, in  addition to nausea, diarrhea, malaise.  Initial EKG with inferior lead ST elevation concerning for STEMI.  Patient underwent urgent left heart cath with no  evidence of obstructive CAD.    I suspect the etiology may be viral in nature given pain onset was with nausea, malaise and diarrhea.  Given she is no longer experiencing diarrhea, no indication for stool testing.  BUN is elevated, however seems near baseline, so low suspicion for uremic pericarditis.  Per chart review, no prior history of autoimmune disorders., although she did have a positive ANA in 2015 that was checked for foot pain. RA factor and CCP antibodies negative in July 2023.  - Cardiology following; appreciate their recommendations - Continue colchicine 0.3 mg daily - Echocardiogram pending - ESR and CRP pending  Acute kidney injury superimposed on chronic kidney disease ([place]) Creatinine elevated on admission at 1.96.  Baseline between 1.5 and 1.7.  Likely in the setting of poor p.o. intake leading to dehydration and hypotension.  -Hold nephrotoxic agents including home spironolactone-hydrochlorothiazide, torsemide and ibuprofen - IV fluid resuscitation with LR - Repeat BMP in the morning  Leukocytosis Likely multifactorial, secondary to acute pericarditis and reactive in the setting of recent diarrhea.  -Repeat CBC in the a.m.  Benign essential hypertension - Holding home antihypertensives in the setting of hypotension and AKI.   Advance Care Planning:   Code Status: Full Code   Consults: Cardiology  Family Communication: Patient's husband and daughter updated at bedside  Severity of Illness: The appropriate patient status for this patient is INPATIENT. Inpatient status is judged to be reasonable and necessary in order to provide the required intensity of service to ensure the patient's safety. The patient's presenting symptoms, physical exam findings, and initial radiographic and laboratory data in the context of their chronic comorbidities is felt to place them at high risk for further clinical deterioration. Furthermore, it is not anticipated that the patient will be  medically stable for discharge from the hospital within 2 midnights of admission.   * I certify that at the point of admission it is my clinical judgment that the patient will require inpatient hospital care spanning beyond 2 midnights from the point of admission due to high intensity of service, high risk for further deterioration and high frequency of surveillance required.*  Author: Jose Persia, MD 10/25/2021 5:55 PM  For on call review [email or web address].

## 2021-10-25 NOTE — Assessment & Plan Note (Signed)
Likely multifactorial, secondary to acute pericarditis and reactive in the setting of recent diarrhea.  -Repeat CBC in the a.m.

## 2021-10-25 NOTE — Consult Note (Signed)
Cardiology Consultation:   Patient ID: [name]; [id number]; 04-11-1945   Admit date: 10/25/2021 Date of Consult: 10/25/2021  Primary Care Provider: Idelle Crouch, MD Primary Cardiologist: [place] Primary Electrophysiologist:  None   Patient Profile:   Sheryl Kennedy is a 76 y.o. 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 Charna Archer.  History of Present Illness:   Sheryl Kennedy is followed by [facility] cardiology.  In 2019, she underwent treadmill MPI for chest pain and shortness of breath which showed she was able to exercise for 3 minutes and 15 seconds with the test being stopped for fatigue.  LVEF 74%.  No evidence of ischemia.  Echo at that time demonstrated a preserved LV systolic function, normal wall motion, mild LVH, and mild mitral and tricuspid regurgitation.  Most recent echo from 03/2021 demonstrated an EF of 50 to 55%, mild LVH, grade 1 diastolic dysfunction, normal RV systolic function, and mild mitral and tricuspid regurgitation.  She was most recently evaluated by her primary cardiologist's office in 06/2021 and was without symptoms of angina or decompensation.  She developed onset of severe substernal chest pain and dyspnea on 10/22/2021 that was rated a 10 out of 10.  She did not take medication or seek medical care at that time.  She went to sleep and woke up on 10/2019 and was feeling somewhat better, therefore she did not pursue further evaluation.  However, she again developed severe substernal chest pressure and shortness of breath on 10/23 rated a 5 out of 10 and persisted.  No associated diaphoresis, nausea, vomiting, palpitations, dizziness, presyncope, or syncope.  Due to persisting symptoms, she took an aspirin at home and drove herself to the ED.  Initial EKG  notable for inferior ST elevation with repeat EKG 10 minutes later showing inferolateral ST elevation with reciprocal changes.  Hemodynamically stable.  Labs notable for BUN 54, serum creatinine 1.96, WBC 7.9, and initial high-sensitivity troponin of 8.  She was evaluated by STEMI MD with continued 5 out of 10 chest pain.  She was taken emergently to the cath lab.   Past Medical History:  Diagnosis Date   Arthritis    Cancer ([place]) 08/2012   skin right anterior lower leg, squamous cell   DSAP (disseminated superficial actinic porokeratosis)    Gout    Hx of seasonal allergies    Hyperlipidemia    Hypertension    Squamous cell carcinoma of skin    R anterior lower leg   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 complications     Past Surgical History:  Procedure Laterality Date   BACK SURGERY  2008   no metal in back   BREAST BIOPSY Right 09/13/2010   core/neg   BREAST BIOPSY Right 12/09/2015   US biopsy/neg   BREAST SURGERY Right 2012   CHONDROPLASTY Left 01/22/2016   Procedure: arthroscopic medial AND LATERAL CHONDROPLASTY;  Surgeon: Dereck Leep, MD;  Location: [facility] ORS;  Service: Orthopedics;  Laterality: Left;   COLONOSCOPY  2011   COLONOSCOPY WITH PROPOFOL N/A 04/14/2015   Procedure: COLONOSCOPY WITH PROPOFOL;  Surgeon: Lollie Sails, MD;  Location: [facility] ENDOSCOPY;  Service: Endoscopy;  Laterality: N/A;   CYST REMOVAL HAND Left 2012   JOINT REPLACEMENT Right 09/30/2013   TOTAL KNEE REPLACEMENT,  DR. Marry Guan, [place] ARTHROSCOPY WITH MEDIAL MENISECTOMY Left 01/22/2016   Procedure: KNEE ARTHROSCOPY WITH MEDIAL AND LATERAL MENISECTOMY;  Surgeon: Dereck Leep, MD;  Location: [facility] ORS;  Service: Orthopedics;  Laterality: Left;   KNEE SURGERY  1997   Stab pheblectomy  Left 2013   TONSILLECTOMY     VEIN CLOSURE Bilateral 2012     Home Meds: Prior to Admission medications   Medication Sig Start Date End Date Taking? Authorizing Provider  allopurinol (ZYLOPRIM) 300  MG tablet Take 300 mg by mouth daily.    [provider]  aspirin 325 MG tablet Take 325 mg by mouth daily.    [provider]  Calcium Carbonate-Vit D-Min (CALCIUM 600+D PLUS MINERALS PO) Take 1 tablet by mouth daily.    [provider]  Cholecalciferol (VITAMIN D3) 25 MCG (1000 UT) CAPS Take by mouth.    [provider]  clobetasol (TEMOVATE) 0.05 % external solution Apply topically to scalp in morning after shampoo hair qd for itchy scalp 09/14/21   Brendolyn Patty, MD  Cyanocobalamin 1000 MCG TBCR Take 1 tablet by mouth daily.     [provider]  docusate sodium (COLACE) 100 MG capsule Take 100 mg by mouth at bedtime.    [provider]  fexofenadine (ALLEGRA) 180 MG tablet Take 180 mg by mouth daily as needed for allergies.    [provider]  Fluocinolone Acetonide Body 0.01 % OIL Apply topically to itchy scaly spot at scalp cover with shower cap qhs 09/14/21   Brendolyn Patty, MD  fluticasone [facility]) 50 MCG/ACT nasal spray Place 1 spray into both nostrils daily as needed for allergies or rhinitis.    [provider]  ibuprofen (ADVIL,MOTRIN) 800 MG tablet Take 1 tablet (800 mg total) by mouth every 8 (eight) hours as needed. 04/18/18   Edrick Kins, DPM  ketoconazole (NIZORAL) 2 % shampoo Apply 1 Application topically as directed. Wash scalp 2-3 times weekly, Let sit 5 minutes and rinse out 09/14/21   Brendolyn Patty, MD  levothyroxine (SYNTHROID) 50 MCG tablet Take 50 mcg by mouth daily before breakfast.    [provider]  metoprolol succinate (TOPROL-XL) 25 MG 24 hr tablet Take 25 mg by mouth daily.  04/26/13   [provider]  mupirocin ointment (BACTROBAN) 2 % Apply to affected areas qd/bid prn. 06/07/21   Brendolyn Patty, MD  Nutritional Supplements (ESTROVEN PM PO) Take 0.5 tablets by mouth at bedtime. For hotflashes    [provider]  potassium chloride SA (K-DUR,KLOR-CON) 20 MEQ tablet Take 20  mEq by mouth 3 (three) times daily.  03/28/13   [provider]  simvastatin (ZOCOR) 40 MG tablet Take 20 mg by mouth daily at 6 PM.  04/08/13   [provider]  spironolactone-hydrochlorothiazide (ALDACTAZIDE) 25-25 MG tablet Take 2 tablets by mouth daily. 09/21/16   [provider]  torsemide (DEMADEX) 20 MG tablet Take 20 mg by mouth daily.  04/26/13   [provider]  traMADol (ULTRAM) 50 MG tablet Take 50 mg by mouth 4 (four) times daily.    [provider]    Inpatient Medications: Scheduled Meds:  betamethasone acetate-betamethasone sodium phosphate  3 mg Intramuscular Once   Continuous Infusions:  PRN Meds:   Allergies:   Allergies  Allergen Reactions   Naproxen Swelling    In hands and joints   Pseudoephedrine Rash and Other (See Comments)    Rhinitis and sneezing Rhinitis and sneezing  Rhinitis and sneezing   Shellfish Allergy Other (See Comments)    Joint swelling, and "gout"     Sulfa Antibiotics Rash    Social History:   Social History   Socioeconomic History   Marital status: Married    Spouse name: Not on file   Number of children: Not on file   Years of education: Not on file   Highest education level: Not on file  Occupational History   Not on file  Tobacco Use   Smoking status: Never   Smokeless tobacco: Never  Substance and Sexual Activity   Alcohol use: Yes    Comment: occassional   Drug use: No   Sexual activity: Not Currently  Other Topics Concern   Not on file  Social History Narrative   Not on file   Social Determinants of Health   Financial Resource Strain: Not on file  Food Insecurity: Not on file  Transportation Needs: Not on file  Physical Activity: Not on file  Stress: Not on file  Social Connections: Not on file  Intimate Partner Violence: Not on file     Family History:   Family History  Problem Relation Age of Onset   Heart failure Mother    Heart disease Mother    Arthritis  Mother    Colon cancer Paternal Grandmother    Breast cancer Neg Hx     ROS:  Review of Systems  Constitutional:  Positive for malaise/fatigue. Negative for chills, diaphoresis, fever and weight loss.  HENT:  Negative for congestion.   Eyes:  Negative for discharge and redness.  Respiratory:  Positive for shortness of breath. Negative for cough, sputum production and wheezing.   Cardiovascular:  Positive for chest pain. Negative for palpitations, orthopnea, claudication, leg swelling and PND.  Gastrointestinal:  Negative for abdominal pain, heartburn, nausea and vomiting.  Musculoskeletal:  Negative for falls and myalgias.  Skin:  Negative for rash.  Neurological:  Negative for dizziness, tingling, tremors, sensory change, speech change, focal weakness, loss of consciousness and weakness.  Endo/Heme/Allergies:  Does not bruise/bleed easily.  Psychiatric/Behavioral:  Negative for substance abuse. The patient is not nervous/anxious.   All other systems reviewed and are negative.     Physical Exam/Data:   Vitals:   10/25/21 1357 10/25/21 1400 10/25/21 1429  BP:  111/83   Pulse:  84   Resp:  18   SpO2:  94% 94%  Weight: 63.5 kg    Height: 5' (1.524 m)     No intake or output data in the 24 hours ending 10/25/21 1429 Filed Weights   10/25/21 1357  Weight: 63.5 kg   Body mass index is 27.34 kg/m.   Physical Exam: General: Well developed, well nourished, in no acute distress. Head: Normocephalic, atraumatic, sclera non-icteric, no xanthomas, nares without discharge.  Neck: Negative for carotid bruits. JVD not elevated. Lungs: Clear bilaterally to auscultation without wheezes, rales, or rhonchi. Breathing is unlabored. Heart: RRR with S1 S2. No murmurs, rubs, or gallops appreciated. Abdomen: Soft, non-tender, non-distended with normoactive bowel sounds. No hepatomegaly. No rebound/guarding. No obvious abdominal masses. Msk:  Strength and tone appear normal for  age. Extremities: No clubbing or cyanosis. No edema. Distal pedal pulses are 2+ and equal bilaterally. Neuro: Alert and oriented X 3. No facial asymmetry. No focal deficit. Moves all extremities spontaneously. Psych:  Responds to questions appropriately with a normal affect.   EKG:  The EKG was personally reviewed and demonstrates: 10/25/2021 -13:50 -NSR, 83 bpm, inferior  ST elevation. 10/25/2021 - 14:01 - NSR, 80 bpm, inferolateral ST elevation with reciprocal changes Telemetry:  Telemetry was personally reviewed and demonstrates: SR  Weights: [employer]   10/25/21 1357  Weight: 63.5 kg    Relevant CV Studies:  2D echo 03/15/2021: ECHOCARDIOGRAPHIC DESCRIPTIONS  AORTIC ROOT                   Size: Normal             Dissection: INDETERM FOR DISSECTION  AORTIC VALVE               Leaflets: Tricuspid                   Morphology: Normal               Mobility: Fully mobile  LEFT VENTRICLE                   Size: Normal                        Anterior: Normal            Contraction: Normal                         Lateral: Normal             Closest EF: >55% (Estimated)                Septal: Normal              LV Masses: No Masses                       Apical: Normal                    LVH: None                          Inferior: Normal                                                      Posterior: Normal           Dias.FxClass: (Grade 1) relaxation abnormal, E/A reversal  MITRAL VALVE               Leaflets: Normal                        Mobility: Fully mobile             Morphology: Normal  LEFT ATRIUM                   Size: Normal                       LA Masses: No masses              IA Septum: Normal IAS  MAIN PA                   Size: Normal  PULMONIC VALVE             Morphology: Normal  Mobility: Fully mobile  RIGHT VENTRICLE              RV Masses: No Masses                         Size: Normal              Free Wall: Normal                      Contraction: Normal  TRICUSPID VALVE               Leaflets: Normal                        Mobility: Fully mobile             Morphology: Normal  RIGHT ATRIUM                   Size: Normal                        RA Other: None                RA Mass: No masses  PERICARDIUM                  Fluid: No effusion  INFERIOR VENACAVA                   Size: Normal Normal respiratory collapse  _________________________________________________________________________________________   DOPPLER ECHO and OTHER SPECIAL PROCEDURES                 Aortic: No AR                      No AS                         115.0 cm/sec peak vel      5.3 mmHg peak grad                 Mitral: MILD MR                    No MS                         MV Inflow E Vel = 71.8 cm/sec     MV Annulus E'Vel = 8.6 cm/sec                         E/E'Ratio = 8.3              Tricuspid: MILD TR                    No TS                         241.0 cm/sec peak TR vel   26.2 mmHg peak RV pressure              Pulmonary: MILD PR                    No PS                         96.2 cm/sec peak vel  3.7 mmHg peak grad  _________________________________________________________________________________________  INTERPRETATION  NORMAL LEFT VENTRICULAR SYSTOLIC FUNCTION  NORMAL RIGHT VENTRICULAR SYSTOLIC FUNCTION  MILD VALVULAR REGURGITATION (See above)  NO VALVULAR STENOSIS  ___________  Treadmill MPI 06/29/2017: LVEF= 74%   FINDINGS:  Regional wall motion:  reveals normal myocardial thickening and wall  motion.  The overall quality of the study is good.    Artifacts noted: no  Left ventricular cavity: normal.   Perfusion Analysis:  SPECT images demonstrate homogeneous tracer  distribution throughout the myocardium.   __________  2D echo 06/29/2017: ECHOCARDIOGRAPHIC MEASUREMENTS  2D DIMENSIONS  AORTA                  Values   Normal Range   MAIN PA         Values    Normal Range                Annulus:  1.8 cm       [2.1-2.5]         PA Main: nm*       [1.5-2.1]              Aorta Sin: 2.8 cm       [2.7-3.3]    RIGHT VENTRICLE            ST Junction: nm*          [2.3-2.9]         RV Base: nm*       [<4.2]              Asc.Aorta: nm*          [2.3-3.1]          RV Mid: nm*       [<3.5]  LEFT VENTRICLE                                      RV Length: 2.7 cm    [<8.6]                  LVIDd: 4.4 cm       [3.9-5.3]    INFERIOR VENA CAVA                  LVIDs: 3.2 cm                        Max. IVC: nm*       [<=2.1]                     FS: 28.3 %       [>25]            Min. IVC: nm*                    SWT: 1.1 cm       [0.5-0.9]    ------------------                    PWT: 0.79 cm      [0.5-0.9]    nm* - not measured  LEFT ATRIUM                LA Diam: 3.4 cm       [2.7-3.8]            LA A4C Area: nm*          [<  20]              LA Volume: nm*          [22-52]  _________________________________________________________________________________________  ECHOCARDIOGRAPHIC DESCRIPTIONS  AORTIC ROOT                   Size: Normal             Dissection: INDETERM FOR DISSECTION  AORTIC VALVE               Leaflets: Tricuspid                   Morphology: Normal               Mobility: Fully mobile  LEFT VENTRICLE                   Size: Normal                        Anterior: Normal            Contraction: Normal                         Lateral: Normal             Closest EF: >55% (Estimated)                Septal: Normal              LV Masses: No Masses                       Apical: Normal                    LVH: MILD LVH                      Inferior: Normal                                                      Posterior: Normal           Dias.FxClass: N/A  MITRAL VALVE               Leaflets: Normal                        Mobility: Fully mobile             Morphology: Normal  LEFT ATRIUM                   Size: Normal                       LA Masses: No masses  MAIN PA                    Size: Normal  PULMONIC VALVE               Leaflets: N/A                         Morphology: Normal               Mobility: Fully mobile  RIGHT VENTRICLE  RV Masses: No Masses                         Size: Normal              Free Wall: Normal                     Contraction: Normal  TRICUSPID VALVE               Leaflets: Normal                        Mobility: Fully mobile             Morphology: Normal  RIGHT ATRIUM                   Size: Normal                        RA Other: None                RA Mass: No masses  PERICARDIUM                  Fluid: No effusion  INFERIOR VENACAVA                   Size: Normal Normal respiratory collapse  _________________________________________________________________________________________   DOPPLER ECHO and OTHER SPECIAL PROCEDURES                 Aortic: No AR                      No AS                         101.8 cm/sec peak vel      4.1 mmHg peak grad                 Mitral: MILD MR                    No MS                         4.1 cm^2 by DOPPLER                         MV Inflow E Vel = 92.2 cm/sec     MV Annulus E'Vel = 9.5 cm/sec                         E/E'Ratio = 9.7              Tricuspid: MILD TR                    No TS                         225.3 cm/sec peak TR vel   25.3 mmHg peak RV pressure              Pulmonary: MILD PR                    No PS  97.7 cm/sec peak vel       3.8 mmHg peak grad  _________________________________________________________________________________________  INTERPRETATION  NORMAL LEFT VENTRICULAR SYSTOLIC FUNCTION   WITH MILD LVH  NORMAL RIGHT VENTRICULAR SYSTOLIC FUNCTION  MILD VALVULAR REGURGITATION (See above)  NO VALVULAR STENOSIS  MILD MR, TR, PR  EF 50-55%  Closest EF: >55% (Estimated)  LVH: MILD LVH  Mitral: MILD MR  Tricuspid: MILD TR    Laboratory Data:  ChemistryNo results for input(s): "NA", "K", "CL", "CO2", "GLUCOSE", "BUN",  "CREATININE", "CALCIUM", "GFRNONAA", "GFRAA", "ANIONGAP" in the last 168 hours.  No results for input(s): "PROT", "ALBUMIN", "AST", "ALT", "ALKPHOS", "BILITOT" in the last 168 hours. Hematology Recent Labs  Lab 10/25/21 1401  WBC 17.9*  RBC 4.18  HGB 13.0  HCT 39.5  MCV 94.5  MCH 31.1  MCHC 32.9  RDW 15.8*  PLT 233   Cardiac EnzymesNo results for input(s): "TROPONINI" in the last 168 hours. No results for input(s): "TROPIPOC" in the last 168 hours.  BNPNo results for input(s): "BNP", "PROBNP" in the last 168 hours.  DDimer No results for input(s): "DDIMER" in the last 168 hours.  Radiology/Studies:  No results found.  Assessment and Plan:   1.  Inferior ST elevation MI: -Emergent LHC -High-sensitivity troponin pending, cycle to peak -ASA taken in the field -Heparin -Obtain echo post cath -CXR -Check fasting lipid panel and A1c for further risk stratification -Escalate lipid therapy to achieve target LDL -Cardiac rehab -Escalate GDMT as indicated with further recommendations pending cath  2.  CKD stage IIIb-IV: -Post-cath hydration -Monitor for contrast-induced nephropathy  3. HLD: -LDL 101 in 12/2020 -Transition from simvastatin to atorvastatin 80 mg  4.  History of left lower extremity DVT: -Status post completion of apixaban   Shared Decision Making/Informed Consent{  The risks [stroke (1 in 1000), death (1 in 1000), kidney failure [usually temporary] (1 in 500), bleeding (1 in 200), allergic reaction [possibly serious] (1 in 200)], benefits (diagnostic support and management of coronary artery disease) and alternatives of a cardiac catheterization were discussed in detail with Ms. Leask and she is willing to proceed.    For questions or updates, please contact [place] Please consult [email or web address] for contact info under Cardiology/STEMI.   Signed, Christell Faith, PA-C [place] Pager: [phone] 10/25/2021, 2:29 PM

## 2021-10-25 NOTE — ED Notes (Signed)
Pt left ED to cath with Dr. Saunders Revel and on [place].

## 2021-10-26 ENCOUNTER — Encounter: Payer: Self-pay | Admitting: Internal Medicine

## 2021-10-26 DIAGNOSIS — I309 Acute pericarditis, unspecified: Secondary | ICD-10-CM | POA: Diagnosis not present

## 2021-10-26 DIAGNOSIS — I10 Essential (primary) hypertension: Secondary | ICD-10-CM

## 2021-10-26 DIAGNOSIS — N1832 Chronic kidney disease, stage 3b: Secondary | ICD-10-CM | POA: Diagnosis not present

## 2021-10-26 DIAGNOSIS — I319 Disease of pericardium, unspecified: Secondary | ICD-10-CM | POA: Diagnosis present

## 2021-10-26 LAB — COMPREHENSIVE METABOLIC PANEL
ALT: 18 U/L (ref 0–44)
AST: 27 U/L (ref 15–41)
Albumin: 3.2 g/dL — ABNORMAL LOW (ref 3.5–5.0)
Alkaline Phosphatase: 50 U/L (ref 38–126)
Anion gap: 10 (ref 5–15)
BUN: 49 mg/dL — ABNORMAL HIGH (ref 8–23)
CO2: 23 mmol/L (ref 22–32)
Calcium: 9.3 mg/dL (ref 8.9–10.3)
Chloride: 100 mmol/L (ref 98–111)
Creatinine, Ser: 1.7 mg/dL — ABNORMAL HIGH (ref 0.44–1.00)
GFR, Estimated: 31 mL/min — ABNORMAL LOW (ref >60)
Glucose, Bld: 77 mg/dL (ref 70–99)
Potassium: 3.4 mmol/L — ABNORMAL LOW (ref 3.5–5.1)
Sodium: 133 mmol/L — ABNORMAL LOW (ref 135–145)
Total Bilirubin: 0.7 mg/dL (ref 0.3–1.2)
Total Protein: 5.9 g/dL — ABNORMAL LOW (ref 6.5–8.1)

## 2021-10-26 LAB — HEMOGLOBIN A1C
Hgb A1c MFr Bld: 6 % — ABNORMAL HIGH (ref 4.8–5.6)
Mean Plasma Glucose: 125.5 mg/dL

## 2021-10-26 LAB — CBC
HCT: 33.5 % — ABNORMAL LOW (ref 36.0–46.0)
Hemoglobin: 11.2 g/dL — ABNORMAL LOW (ref 12.0–15.0)
MCH: 30.8 pg (ref 26.0–34.0)
MCHC: 33.4 g/dL (ref 30.0–36.0)
MCV: 92 fL (ref 80.0–100.0)
Platelets: 212 10*3/uL (ref 150–400)
RBC: 3.64 MIL/uL — ABNORMAL LOW (ref 3.87–5.11)
RDW: 15.3 % (ref 11.5–15.5)
WBC: 10.7 10*3/uL — ABNORMAL HIGH (ref 4.0–10.5)
nRBC: 0 % (ref 0.0–0.2)

## 2021-10-26 LAB — LIPID PANEL
Cholesterol: 113 mg/dL (ref 0–200)
HDL: 52 mg/dL (ref >40)
LDL Cholesterol: 52 mg/dL (ref 0–99)
Total CHOL/HDL Ratio: 2.2 RATIO
Triglycerides: 45 mg/dL (ref <150)
VLDL: 9 mg/dL (ref 0–40)

## 2021-10-26 LAB — GLUCOSE, CAPILLARY: Glucose-Capillary: 75 mg/dL (ref 70–99)

## 2021-10-26 MED ORDER — IBUPROFEN 400 MG PO TABS: 600.0000 mg | ORAL_TABLET | Freq: Three times a day (TID) | ORAL | Status: DC | PRN

## 2021-10-26 MED ORDER — PANTOPRAZOLE SODIUM 40 MG PO TBEC
40.0000 mg | DELAYED_RELEASE_TABLET | Freq: Every day | ORAL | Status: DC
  Administered 2021-10-26: 40 mg via ORAL
  Filled 2021-10-26: qty 1

## 2021-10-26 MED ORDER — COLCHICINE 0.6 MG PO TABS: 0.3000 mg | ORAL_TABLET | Freq: Every day | ORAL | 0 refills | Status: DC

## 2021-10-26 MED ORDER — POTASSIUM CHLORIDE CRYS ER 20 MEQ PO TBCR
40.0000 meq | EXTENDED_RELEASE_TABLET | Freq: Once | ORAL | Status: AC
  Administered 2021-10-26: 40 meq via ORAL
  Filled 2021-10-26: qty 2

## 2021-10-26 MED ORDER — ASPIRIN 81 MG PO CHEW
81.0000 mg | CHEWABLE_TABLET | Freq: Every day | ORAL | Status: DC
  Administered 2021-10-26: 81 mg via ORAL
  Filled 2021-10-26: qty 1

## 2021-10-26 NOTE — Discharge Summary (Signed)
Physician Discharge Summary   Patient: Sheryl Kennedy MRN: [id number] DOB: [birth year]  Admit date:     10/25/2021  Discharge date: 10/26/21  Discharge Physician: Sharen Hones   PCP: Idelle Crouch, MD   Recommendations at discharge:   Follow-up with PCP in 1 week. Follow-up with cardiology in 1 to 2 weeks.  Discharge Diagnoses: Principal Problem:   Acute pericarditis Active Problems:   Acute kidney injury superimposed on chronic kidney disease (HCC)   Leukocytosis   Benign essential hypertension   Chronic kidney disease, stage 3b (HCC) Acute kidney injury ruled out. Resolved Problems:   * No resolved hospital problems. * Hypokalemia. Hospital Course: Sheryl Kennedy is a 76 y.o. female with medical history significant of hypertension, hyperlipidemia, gout, CKD stage IIIb, who presents to the ED with complaints of chest pain.  Initial EKG showed inferior leads ST elevation.  Patient had a urgent heart cath, no evidence obstructive coronary disease. Echocardiogram showed normal ejection fraction with trace pericardial effusion.  Patient condition is consistent with a viral pericarditis.  She was given colchicine.  Condition has improved, she is medically stable to be discharged.  Assessment and Plan: Acute viral pericarditis Condition stable, continue colchicine for 7 days, follow-up with PCP and cardiology in the office.  Chronic kidney disease stage IIIb. Hypokalemia. Acute kidney injury ruled out. Reviewed prior charts from care everywhere, patient has a chronic kidney stage IIIb.  Renal function still stable, no acute kidney injury. Follow-up with PCP as outpatient.  At the moment, I will discontinue ibuprofen. Potassium 3.4 today, give 40 mill equivalent of oral potassium.  Essential hypertension. Patient was taken torsemide, discontinue spironolactone/HCTZ.       Consultants: Cardiology Procedures performed: Heart cath. Disposition: Home Diet recommendation:   Discharge Diet Orders (From admission, onward)     Start     Ordered   10/26/21 0000  Diet - low sodium heart healthy        10/26/21 1417           Cardiac diet DISCHARGE MEDICATION: Allergies as of 10/26/2021       Reactions   Naproxen Swelling   In hands and joints   Pseudoephedrine Rash, Other (See Comments)   Rhinitis and sneezing Rhinitis and sneezing Rhinitis and sneezing   Shellfish Allergy Other (See Comments)   Joint swelling, and "gout"    Sulfa Antibiotics Rash        Medication List     STOP taking these medications    ibuprofen 800 MG tablet Commonly known as: ADVIL   spironolactone-hydrochlorothiazide 25-25 MG tablet Commonly known as: ALDACTAZIDE       TAKE these medications    allopurinol 300 MG tablet Commonly known as: ZYLOPRIM Take 300 mg by mouth daily.   aspirin 325 MG tablet Take 325 mg by mouth daily.   CALCIUM 600+D PLUS MINERALS PO Take 1 tablet by mouth daily.   clobetasol 0.05 % external solution Commonly known as: TEMOVATE Apply topically to scalp in morning after shampoo hair qd for itchy scalp   colchicine 0.6 MG tablet Take 0.5 tablets (0.3 mg total) by mouth daily for 7 days. Start taking on: October 27, 2021   Cyanocobalamin 1000 MCG Tbcr Take 1 tablet by mouth daily.   docusate sodium 100 MG capsule Commonly known as: COLACE Take 100 mg by mouth at bedtime.   ESTROVEN PM PO Take 0.5 tablets by mouth at bedtime. For hotflashes   fexofenadine 180 MG tablet Commonly known  as: ALLEGRA Take 180 mg by mouth daily as needed for allergies.   Fluocinolone Acetonide Body 0.01 % Oil Apply topically to itchy scaly spot at scalp cover with shower cap qhs   fluticasone 50 MCG/ACT nasal spray Commonly known as: FLONASE Place 1 spray into both nostrils daily as needed for allergies or rhinitis.   ketoconazole 2 % shampoo Commonly known as: NIZORAL Apply 1 Application topically as directed. Wash scalp 2-3 times  weekly, Let sit 5 minutes and rinse out   levothyroxine 50 MCG tablet Commonly known as: SYNTHROID Take 50 mcg by mouth daily before breakfast.   metoprolol succinate 25 MG 24 hr tablet Commonly known as: TOPROL-XL Take 25 mg by mouth daily.   mupirocin ointment 2 % Commonly known as: BACTROBAN Apply to affected areas qd/bid prn.   potassium chloride SA 20 MEQ tablet Commonly known as: KLOR-CON M Take 20 mEq by mouth 3 (three) times daily.   simvastatin 40 MG tablet Commonly known as: ZOCOR Take 20 mg by mouth daily at 6 PM.   torsemide 20 MG tablet Commonly known as: DEMADEX Take 20 mg by mouth daily.   traMADol 50 MG tablet Commonly known as: ULTRAM Take 50 mg by mouth 4 (four) times daily.   Vitamin D3 25 MCG (1000 UT) Caps Take by mouth.        Follow-up Information     Corey Skains, MD. Go in 1 week(s).   Specialty: Cardiology Contact information: [facility] [place] [address] [phone]         Idelle Crouch, MD Follow up in 1 week(s).   Specialty: Internal Medicine Contact information: [facility] [place] [address] [phone]                Discharge Exam: Danley Danker Weights   10/25/21 1357 10/25/21 1515  Weight: 63.5 kg 66.3 kg   General exam: Appears calm and comfortable  Respiratory system: Clear to auscultation. Respiratory effort normal. Cardiovascular system: S1 & S2 heard, RRR. No JVD, murmurs, rubs, gallops or clicks. No pedal edema. Gastrointestinal system: Abdomen is nondistended, soft and nontender. No organomegaly or masses felt. Normal bowel sounds heard. Central nervous system: Alert and oriented. No focal neurological deficits. Extremities: Symmetric 5 x 5 power. Skin: No rashes, lesions or ulcers Psychiatry: Judgement and insight appear normal. Mood & affect appropriate.    Condition at discharge: good  The results of significant  diagnostics from this hospitalization (including imaging, microbiology, ancillary and laboratory) are listed below for reference.   Imaging Studies: CARDIAC CATHETERIZATION  Result Date: 10/25/2021 Conclusions: Mild, nonobstructive coronary artery disease with 20% mid RCA and 30% ostial rPDA stenoses.. Normal left ventricular systolic function and filling pressure (LVEF greater than 65%, LVEDP 12 mmHg).  Recommendations: Suspect acute pericarditis.  Obtain echocardiogram and initiate colchicine. Medical therapy and risk factor modification to prevent progression of mild coronary artery disease. Gentle post-catheterization hydration in the setting of chronic kidney disease and normal LVEF/LVEDP. Nelva Bush, MD [facility]  ECHOCARDIOGRAM COMPLETE  Result Date: 10/25/2021    ECHOCARDIOGRAM REPORT   Patient Name:   SUA SPADAFORA Date of Exam: 10/25/2021 Medical Rec #:  [id number]      Height:       60.0 in Accession #:    [id number]     Weight:       146.2 lb Date of Birth:  01-11-1945      BSA:  1.634 m Patient Age:    50 years       BP:           89/60 mmHg Patient Gender: F              HR:           74 bpm. Exam Location:  [facility] Procedure: 2D Echo, Cardiac Doppler and Color Doppler Indications:     I21.9 Myocardial Infarct  History:         Patient has no prior history of Echocardiogram examinations.                  Cardiac catheterization 10/25/2021; Risk Factors:Hypertension                  and Dyslipidemia.  Sonographer:     Rosalia Hammers Referring Phys:  [phone] CHRISTOPHER END Diagnosing Phys: Nelva Bush MD IMPRESSIONS  1. Septal "bounce" is noted in some images, which could be seen with a constrictive process. Left ventricular ejection fraction, by estimation, is 55 to 60%. The left ventricle has normal function. The left ventricle has no regional wall motion abnormalities. Left ventricular diastolic parameters are consistent with Grade II diastolic dysfunction  (pseudonormalization).  2. Right ventricular systolic function is normal. The right ventricular size is mildly enlarged. There is normal pulmonary artery systolic pressure.  3. The mitral valve is grossly normal. Trivial mitral valve regurgitation. No evidence of mitral stenosis.  4. Tricuspid valve regurgitation is mild to moderate.  5. The aortic valve is tricuspid. Aortic valve regurgitation is not visualized. No aortic stenosis is present.  6. The inferior vena cava is normal in size with <50% respiratory variability, suggesting right atrial pressure of 8 mmHg. FINDINGS  Left Ventricle: Septal "bounce" is noted in some images, which could be seen with a constrictive process. Left ventricular ejection fraction, by estimation, is 55 to 60%. The left ventricle has normal function. The left ventricle has no regional wall motion abnormalities. The left ventricular internal cavity size was normal in size. There is borderline left ventricular hypertrophy. Left ventricular diastolic parameters are consistent with Grade II diastolic dysfunction (pseudonormalization). Right Ventricle: The right ventricular size is mildly enlarged. No increase in right ventricular wall thickness. Right ventricular systolic function is normal. There is normal pulmonary artery systolic pressure. The tricuspid regurgitant velocity is 2.63  m/s, and with an assumed right atrial pressure of 8 mmHg, the estimated right ventricular systolic pressure is 32.3 mmHg. Left Atrium: Left atrial size was normal in size. Right Atrium: Right atrial size was normal in size. Pericardium: Trivial pericardial effusion is present. Mitral Valve: The mitral valve is grossly normal. Trivial mitral valve regurgitation. No evidence of mitral valve stenosis. Tricuspid Valve: The tricuspid valve is normal in structure. Tricuspid valve regurgitation is mild to moderate. Aortic Valve: The aortic valve is tricuspid. Aortic valve regurgitation is not visualized. No aortic  stenosis is present. Aortic valve mean gradient measures 2.0 mmHg. Aortic valve peak gradient measures 3.6 mmHg. Aortic valve area, by VTI measures 2.09 cm. Pulmonic Valve: The pulmonic valve was grossly normal. Pulmonic valve regurgitation is mild to moderate. No evidence of pulmonic stenosis. Aorta: The aortic root and ascending aorta are structurally normal, with no evidence of dilitation. Venous: The inferior vena cava is normal in size with less than 50% respiratory variability, suggesting right atrial pressure of 8 mmHg. IAS/Shunts: No atrial level shunt detected by color flow Doppler.  LEFT VENTRICLE PLAX 2D LVIDd:  3.20 cm   Diastology LVIDs:         1.80 cm   LV e' medial:    7.07 cm/s LV PW:         1.00 cm   LV E/e' medial:  10.7 LV IVS:        0.80 cm   LV e' lateral:   6.64 cm/s LVOT diam:     1.90 cm   LV E/e' lateral: 11.4 LV SV:         38 LV SV Index:   23 LVOT Area:     2.84 cm  RIGHT VENTRICLE RV Basal diam:  4.28 cm RV Mid diam:    3.00 cm RV S prime:     11.90 cm/s TAPSE (M-mode): 1.8 cm LEFT ATRIUM             Index        RIGHT ATRIUM           Index LA diam:        2.20 cm 1.35 cm/m   RA Area:     11.30 cm LA Vol (A2C):   38.6 ml 23.63 ml/m  RA Volume:   25.60 ml  15.67 ml/m LA Vol (A4C):   13.7 ml 8.39 ml/m LA Biplane Vol: 23.0 ml 14.08 ml/m  AORTIC VALVE                    PULMONIC VALVE AV Area (Vmax):    2.32 cm     PR End Diast Vel: 2.40 msec AV Area (Vmean):   2.13 cm AV Area (VTI):     2.09 cm AV Vmax:           95.40 cm/s AV Vmean:          63.400 cm/s AV VTI:            0.182 m AV Peak Grad:      3.6 mmHg AV Mean Grad:      2.0 mmHg LVOT Vmax:         78.10 cm/s LVOT Vmean:        47.700 cm/s LVOT VTI:          0.134 m LVOT/AV VTI ratio: 0.74  AORTA Ao Root diam: 2.80 cm MITRAL VALVE               TRICUSPID VALVE MV Area (PHT): 3.34 cm    TR Peak grad:   27.7 mmHg MV Decel Time: 227 msec    TR Vmax:        263.00 cm/s MV E velocity: 76.00 cm/s MV A velocity: 72.30  cm/s  SHUNTS MV E/A ratio:  1.05        Systemic VTI:  0.13 m                            Systemic Diam: 1.90 cm Nelva Bush MD Electronically signed by Nelva Bush MD Signature Date/Time: 10/25/2021/4:59:48 PM    Final     Microbiology: Results for orders placed or performed during the hospital encounter of 10/25/21  MRSA Next Gen by PCR, Nasal     Status: None   Collection Time: 10/25/21  3:19 PM   Specimen: Nasal Mucosa; Nasal Swab  Result Value Ref Range Status   MRSA by PCR Next Gen NOT DETECTED NOT DETECTED Final    Comment: (NOTE) The GeneXpert MRSA Assay (FDA approved for NASAL  specimens only), is one component of a comprehensive MRSA colonization surveillance program. It is not intended to diagnose MRSA infection nor to guide or monitor treatment for MRSA infections. Test performance is not FDA approved in patients less than 39 years old. Performed at [facility], [address] [place], [place] [address]     Labs: CBC: Recent Labs  Lab 10/25/21 1401 10/26/21 0409  WBC 17.9* 10.7*  HGB 13.0 11.2*  HCT 39.5 33.5*  MCV 94.5 92.0  PLT 233 488   Basic Metabolic Panel: Recent Labs  Lab 10/25/21 1401 10/26/21 0409  NA 134* 133*  K 4.0 3.4*  CL 96* 100  CO2 27 23  GLUCOSE 109* 77  BUN 54* 49*  CREATININE 1.96* 1.70*  CALCIUM 10.1 9.3   Liver Function Tests: Recent Labs  Lab 10/26/21 0409  AST 27  ALT 18  ALKPHOS 50  BILITOT 0.7  PROT 5.9*  ALBUMIN 3.2*   CBG: Recent Labs  Lab 10/25/21 1515  GLUCAP 75    Discharge time spent: greater than 30 minutes.  Signed: Sharen Hones, MD [facility] 10/26/2021

## 2021-10-26 NOTE — Progress Notes (Signed)
Patient provided discharge paperwork and instructions. Patient has no questions at this time. PIV removed, guaze in place. All belongings returned. Patient dressed and awaiting transport to medical mall entrance via wheelchair.

## 2021-10-26 NOTE — Progress Notes (Signed)
  Chaplain On-Call responded to Spiritual Care Consult Orders from Nelva Bush, MD.  The request was to bring Advance Directives information to the patient.  Chaplain met the patient and her husband Eulas Post at bedside. Chaplain provided supportive listening as the patient described the heart problems that led to this hospitalization.  Chaplain offered prayer and spiritual and emotional support for the patient and Eulas Post. Chaplain also gave education and the AD documents to the patient.  Chaplain Pollyann Samples M.Div., [facility]

## 2021-10-26 NOTE — Care Management CC44 (Signed)
Condition Code 44 Documentation Completed  Patient Details  Name: Sheryl Kennedy MRN: [id number] Date of Birth: 09/24/1945   Condition Code 44 given:  yes    Amador Cunas, [place] 10/26/2021, 3:05 PM

## 2021-10-26 NOTE — Consult Note (Signed)
Sheryl Kennedy NOTE       Patient ID: Sheryl Kennedy MRN: [id number] DOB/AGE: Aug 17, 1945 76 y.o.  Admit date: 10/25/2021 Referring Physician Dr. Charleen Kirks Primary Physician Dr. Doy Hutching Primary Cardiologist previous Dr. Corky Sox  Reason for Consultation acute pericarditis   HPI: Sheryl Kennedy is a 25yoF with  PMH of HFpEF (55-60% g3 dd, mi-mod TR 10/2021, prev EF >55%, G1 DD 03/2021) hypertension, hyperlipidemia, CKD 3 who presented to [facility] ED 10/25/2021 with 2 episodes of severe substernal chest pressure and associated shortness of breath prior to admission, initial EKG showed inferior ST elevation, and repeat showing evolving inferolateral ST elevation with reciprocal changes.  The on-call STEMI MD (Dr. Saunders Revel) evaluated the patient, code STEMI was activated and she was taken emergently to the Cath Lab.  LHC revealed mild, nonobstructive CAD (20% mid RCA, 30% ostial RPDA) and normal LVEF and LVEDP. Acute pericarditis is suspected, [facility] clinic cardiology is assuming care on 10/24.  She reports 2 to 3 weeks ago she had what she thought was "allergies" with symptoms of nasal congestion and cough for which she took Allegra and Mucinex with relief.  On Friday 10/20 she had nausea and central chest pain that radiated to her shoulders that was sharp in nature with associated shortness of breath.  She took 3 aspirins with improvement in her symptoms until the next day.  The day prior to admission 10/22 she had similar central chest pain and associated shortness of breath, which reoccurred the following day 10/23.  She went to see her physiatrist at [facility] clinic who referred her directly to the emergency department.  This discomfort continued while in the ED, and EKG was concerning for ST elevations initially inferiorly, and on repeat spread to the lateral leads, code STEMI was activated and she was taken emergently to the Cath Lab by the STEMI MD (Dr. Saunders Revel) and LHC revealed mild,  nonobstructive CAD, inconsistent with STEMI, felt ultimately to be attributed to pericarditis.  She was given IV fluids post procedurally in addition to colchicine at reduced dose 0.6 mg once daily based on her renal function.  At my time of evaluation she is sitting upright in ICU eating breakfast with her husband.  She says her chest pain has much improved, although she still feels occasional "twinges" of sharp central discomfort that are much less severe than when she first presented.  She notes that her chest discomfort is worsened when she is laying in bed, also notes some tightness in her neck with this positioning.  She denies shortness of breath, orthopnea, palpitations, dizziness.  Her peripheral edema is at her baseline and is mostly dependent in nature (resolves with elevation of her legs overnight).  Denies any bleeding or oozing from her right arteriotomy site.  Recent vitals are notable for a blood pressure of 115/62, heart rate in the 80s in sinus rhythm on telemetry.  She is comfortable on room air.  Labs are notable for slight hypokalemia with potassium 3.4, BUN/creatinine improved from yesterday at 49/1.7 and GFR of 31 (was 54/1.96 and 26.  High-sensitivity troponin negative at 8-8.  Lipid panel shows TC 113, HDL 52, LDL 52, triglycerides 45, VLDL 9.  CRP elevated at 24.3.  CBC shows improving leukocytosis from 17.9-10.7 today, downtrending hemoglobin from 13-11.2 today, platelets 212.  Echocardiogram complete revealed preserved LVEF with grade 2 diastolic dysfunction, mild to moderate TR, without evidence of pericardial effusion.  She has a history of Lexiscan stress test 06/2017 which revealed no  exercise-induced arrhythmia or ischemia, fair exercise tolerance (exercised 3 minutes and 15 seconds), normal LV function, no evidence of reversible ischemia, overall a low risk study.  Review of systems complete and found to be negative unless listed above     Past Medical History:   Diagnosis Date   Arthritis    Cancer ([place]) 08/2012   skin right anterior lower leg, squamous cell   DSAP (disseminated superficial actinic porokeratosis)    Gout    Hx of seasonal allergies    Hyperlipidemia    Hypertension    Squamous cell carcinoma of skin    R anterior lower leg   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 complications     Past Surgical History:  Procedure Laterality Date   BACK SURGERY  2008   no metal in back   BREAST BIOPSY Right 09/13/2010   core/neg   BREAST BIOPSY Right 12/09/2015   US biopsy/neg   BREAST SURGERY Right 2012   CHONDROPLASTY Left 01/22/2016   Procedure: arthroscopic medial AND LATERAL CHONDROPLASTY;  Surgeon: Dereck Leep, MD;  Location: [facility] ORS;  Service: Orthopedics;  Laterality: Left;   COLONOSCOPY  2011   COLONOSCOPY WITH PROPOFOL N/A 04/14/2015   Procedure: COLONOSCOPY WITH PROPOFOL;  Surgeon: Lollie Sails, MD;  Location: [facility] ENDOSCOPY;  Service: Endoscopy;  Laterality: N/A;   CYST REMOVAL HAND Left 2012   JOINT REPLACEMENT Right 09/30/2013   TOTAL KNEE REPLACEMENT, DR. Marry Guan, [place] ARTHROSCOPY WITH MEDIAL MENISECTOMY Left 01/22/2016   Procedure: KNEE ARTHROSCOPY WITH MEDIAL AND LATERAL MENISECTOMY;  Surgeon: Dereck Leep, MD;  Location: [facility] ORS;  Service: Orthopedics;  Laterality: Left;   [place]   LEFT HEART CATH AND CORONARY ANGIOGRAPHY N/A 10/25/2021   Procedure: LEFT HEART CATH AND CORONARY ANGIOGRAPHY;  Surgeon: Nelva Bush, MD;  Location: [place] CV LAB;  Service: Cardiovascular;  Laterality: N/A;   Stab pheblectomy  Left 2013   TONSILLECTOMY     VEIN CLOSURE Bilateral 2012    Facility-Administered Medications Prior to Admission  Medication Dose Route Frequency Provider Last Rate Last Admin   betamethasone acetate-betamethasone sodium phosphate (CELESTONE) injection 3 mg  3 mg Intramuscular Once Edrick Kins, DPM       Medications Prior to Admission  Medication Sig Dispense  Refill Last Dose   allopurinol (ZYLOPRIM) 300 MG tablet Take 300 mg by mouth daily.      aspirin 325 MG tablet Take 325 mg by mouth daily.      Calcium Carbonate-Vit D-Min (CALCIUM 600+D PLUS MINERALS PO) Take 1 tablet by mouth daily.      Cholecalciferol (VITAMIN D3) 25 MCG (1000 UT) CAPS Take by mouth.      clobetasol (TEMOVATE) 0.05 % external solution Apply topically to scalp in morning after shampoo hair qd for itchy scalp 50 mL 0    Cyanocobalamin 1000 MCG TBCR Take 1 tablet by mouth daily.       docusate sodium (COLACE) 100 MG capsule Take 100 mg by mouth at bedtime.      fexofenadine (ALLEGRA) 180 MG tablet Take 180 mg by mouth daily as needed for allergies.      Fluocinolone Acetonide Body 0.01 % OIL Apply topically to itchy scaly spot at scalp cover with shower cap qhs 120 mL 5    fluticasone (FLONASE) 50 MCG/ACT nasal spray Place 1 spray into both nostrils daily as needed for allergies or rhinitis.      ibuprofen (ADVIL,MOTRIN) 800  MG tablet Take 1 tablet (800 mg total) by mouth every 8 (eight) hours as needed. 90 tablet 1    ketoconazole (NIZORAL) 2 % shampoo Apply 1 Application topically as directed. Wash scalp 2-3 times weekly, Let sit 5 minutes and rinse out 120 mL 3    levothyroxine (SYNTHROID) 50 MCG tablet Take 50 mcg by mouth daily before breakfast.      metoprolol succinate (TOPROL-XL) 25 MG 24 hr tablet Take 25 mg by mouth daily.       mupirocin ointment (BACTROBAN) 2 % Apply to affected areas qd/bid prn. 22 g 2    Nutritional Supplements (ESTROVEN PM PO) Take 0.5 tablets by mouth at bedtime. For hotflashes      potassium chloride SA (K-DUR,KLOR-CON) 20 MEQ tablet Take 20 mEq by mouth 3 (three) times daily.       simvastatin (ZOCOR) 40 MG tablet Take 20 mg by mouth daily at 6 PM.       spironolactone-hydrochlorothiazide (ALDACTAZIDE) 25-25 MG tablet Take 2 tablets by mouth daily.  9    torsemide (DEMADEX) 20 MG tablet Take 20 mg by mouth daily.       traMADol (ULTRAM) 50 MG  tablet Take 50 mg by mouth 4 (four) times daily.      Social History   Socioeconomic History   Marital status: Married    Spouse name: Not on file   Number of children: Not on file   Years of education: Not on file   Highest education level: Not on file  Occupational History   Not on file  Tobacco Use   Smoking status: Never   Smokeless tobacco: Never  Substance and Sexual Activity   Alcohol use: Yes    Comment: occassional   Drug use: No   Sexual activity: Not Currently  Other Topics Concern   Not on file  Social History Narrative   Not on file   Social Determinants of Health   Financial Resource Strain: Not on file  Food Insecurity: No Food Insecurity (10/25/2021)   Hunger Vital Sign    Worried About Running Out of Food in the Last Year: Never true    Ran Out of Food in the Last Year: Never true  Transportation Needs: No Transportation Needs (10/25/2021)   PRAPARE - Hydrologist (Medical): No    Lack of Transportation (Non-Medical): No  Physical Activity: Not on file  Stress: Not on file  Social Connections: Not on file  Intimate Partner Violence: Not At Risk (10/25/2021)   Humiliation, Afraid, Rape, and Kick questionnaire    Fear of Current or Ex-Partner: No    Emotionally Abused: No    Physically Abused: No    Sexually Abused: No    Family History  Problem Relation Age of Onset   Heart failure Mother    Heart disease Mother    Arthritis Mother    Colon cancer Paternal Grandmother    Breast cancer Neg Hx      Vitals:   10/26/21 0700 10/26/21 0738 10/26/21 0800 10/26/21 0900  BP: (!) 113/58  116/60 113/62  Pulse: 73 85 81 84  Resp: 15 11 (!) 22 14  Temp:  98 F (36.7 C)    TempSrc:  Oral    SpO2: 95% 95% 100% 100%  Weight:      Height:        PHYSICAL EXAM General: Pleasant elderly Caucasian female, well nourished, in no acute distress.  Sitting upright with  legs off ICU bed, eating breakfast with her husband HEENT:   Normocephalic and atraumatic. Neck:  No JVD.  Lungs: Normal respiratory effort on room air.  Trace crackles on the left.  No wheezes. Heart: HRRR . Normal S1 and S2 without gallops or murmurs.  Abdomen: Non-distended appearing.  Msk: Normal strength and tone for age. Extremities: Warm and well perfused. No clubbing, cyanosis.  Trace bilateral lower extremity edema with chronic venous stasis pigmentation, at baseline per patient. R arteriotomy site with some ecchymosis without bleeding or oozing, covered with gauze and tape.  Neuro: Alert and oriented X 3. Psych:  Answers questions appropriately.   Labs: Basic Metabolic Panel: Recent Labs    10/25/21 1401 10/26/21 0409  NA 134* 133*  K 4.0 3.4*  CL 96* 100  CO2 27 23  GLUCOSE 109* 77  BUN 54* 49*  CREATININE 1.96* 1.70*  CALCIUM 10.1 9.3   Liver Function Tests: Recent Labs    10/26/21 0409  AST 27  ALT 18  ALKPHOS 50  BILITOT 0.7  PROT 5.9*  ALBUMIN 3.2*   No results for input(s): "LIPASE", "AMYLASE" in the last 72 hours. CBC: Recent Labs    10/25/21 1401 10/26/21 0409  WBC 17.9* 10.7*  HGB 13.0 11.2*  HCT 39.5 33.5*  MCV 94.5 92.0  PLT 233 212   Cardiac Enzymes: Recent Labs    10/25/21 1401 10/25/21 1708  TROPONINIHS 8 8   BNP: No results for input(s): "BNP" in the last 72 hours. D-Dimer: No results for input(s): "DDIMER" in the last 72 hours. Hemoglobin A1C: No results for input(s): "HGBA1C" in the last 72 hours. Fasting Lipid Panel: Recent Labs    10/26/21 0409  CHOL 113  HDL 52  LDLCALC 52  TRIG 45  CHOLHDL 2.2   Thyroid Function Tests: No results for input(s): "TSH", "T4TOTAL", "T3FREE", "THYROIDAB" in the last 72 hours.  Invalid input(s): "FREET3" Anemia Panel: No results for input(s): "VITAMINB12", "FOLATE", "FERRITIN", "TIBC", "IRON", "RETICCTPCT" in the last 72 hours.   Radiology: CARDIAC CATHETERIZATION  Result Date: 10/25/2021 Conclusions: Mild, nonobstructive coronary artery  disease with 20% mid RCA and 30% ostial rPDA stenoses.. Normal left ventricular systolic function and filling pressure (LVEF greater than 65%, LVEDP 12 mmHg).  Recommendations: Suspect acute pericarditis.  Obtain echocardiogram and initiate colchicine. Medical therapy and risk factor modification to prevent progression of mild coronary artery disease. Gentle post-catheterization hydration in the setting of chronic kidney disease and normal LVEF/LVEDP. Nelva Bush, MD [facility]  ECHOCARDIOGRAM COMPLETE  Result Date: 10/25/2021    ECHOCARDIOGRAM REPORT   Patient Name:   AMAIYA SCRUTON Date of Exam: 10/25/2021 Medical Rec #:  [id number]      Height:       60.0 in Accession #:    [id number]     Weight:       146.2 lb Date of Birth:  [birth year]      BSA:          1.634 m Patient Age:    19 years       BP:           89/60 mmHg Patient Gender: F              HR:           74 bpm. Exam Location:  [facility] Procedure: 2D Echo, Cardiac Doppler and Color Doppler Indications:     I21.9 Myocardial Infarct  History:         Patient  has no prior history of Echocardiogram examinations.                  Cardiac catheterization 10/25/2021; Risk Factors:Hypertension                  and Dyslipidemia.  Sonographer:     Rosalia Hammers Referring Phys:  [id number] CHRISTOPHER END Diagnosing Phys: Nelva Bush MD IMPRESSIONS  1. Septal "bounce" is noted in some images, which could be seen with a constrictive process. Left ventricular ejection fraction, by estimation, is 55 to 60%. The left ventricle has normal function. The left ventricle has no regional wall motion abnormalities. Left ventricular diastolic parameters are consistent with Grade II diastolic dysfunction (pseudonormalization).  2. Right ventricular systolic function is normal. The right ventricular size is mildly enlarged. There is normal pulmonary artery systolic pressure.  3. The mitral valve is grossly normal. Trivial mitral valve regurgitation. No evidence of mitral  stenosis.  4. Tricuspid valve regurgitation is mild to moderate.  5. The aortic valve is tricuspid. Aortic valve regurgitation is not visualized. No aortic stenosis is present.  6. The inferior vena cava is normal in size with <50% respiratory variability, suggesting right atrial pressure of 8 mmHg. FINDINGS  Left Ventricle: Septal "bounce" is noted in some images, which could be seen with a constrictive process. Left ventricular ejection fraction, by estimation, is 55 to 60%. The left ventricle has normal function. The left ventricle has no regional wall motion abnormalities. The left ventricular internal cavity size was normal in size. There is borderline left ventricular hypertrophy. Left ventricular diastolic parameters are consistent with Grade II diastolic dysfunction (pseudonormalization). Right Ventricle: The right ventricular size is mildly enlarged. No increase in right ventricular wall thickness. Right ventricular systolic function is normal. There is normal pulmonary artery systolic pressure. The tricuspid regurgitant velocity is 2.63  m/s, and with an assumed right atrial pressure of 8 mmHg, the estimated right ventricular systolic pressure is 85.0 mmHg. Left Atrium: Left atrial size was normal in size. Right Atrium: Right atrial size was normal in size. Pericardium: Trivial pericardial effusion is present. Mitral Valve: The mitral valve is grossly normal. Trivial mitral valve regurgitation. No evidence of mitral valve stenosis. Tricuspid Valve: The tricuspid valve is normal in structure. Tricuspid valve regurgitation is mild to moderate. Aortic Valve: The aortic valve is tricuspid. Aortic valve regurgitation is not visualized. No aortic stenosis is present. Aortic valve mean gradient measures 2.0 mmHg. Aortic valve peak gradient measures 3.6 mmHg. Aortic valve area, by VTI measures 2.09 cm. Pulmonic Valve: The pulmonic valve was grossly normal. Pulmonic valve regurgitation is mild to moderate. No  evidence of pulmonic stenosis. Aorta: The aortic root and ascending aorta are structurally normal, with no evidence of dilitation. Venous: The inferior vena cava is normal in size with less than 50% respiratory variability, suggesting right atrial pressure of 8 mmHg. IAS/Shunts: No atrial level shunt detected by color flow Doppler.  LEFT VENTRICLE PLAX 2D LVIDd:         3.20 cm   Diastology LVIDs:         1.80 cm   LV e' medial:    7.07 cm/s LV PW:         1.00 cm   LV E/e' medial:  10.7 LV IVS:        0.80 cm   LV e' lateral:   6.64 cm/s LVOT diam:     1.90 cm   LV E/e' lateral: 11.4 LV SV:  38 LV SV Index:   23 LVOT Area:     2.84 cm  RIGHT VENTRICLE RV Basal diam:  4.28 cm RV Mid diam:    3.00 cm RV S prime:     11.90 cm/s TAPSE (M-mode): 1.8 cm LEFT ATRIUM             Index        RIGHT ATRIUM           Index LA diam:        2.20 cm 1.35 cm/m   RA Area:     11.30 cm LA Vol (A2C):   38.6 ml 23.63 ml/m  RA Volume:   25.60 ml  15.67 ml/m LA Vol (A4C):   13.7 ml 8.39 ml/m LA Biplane Vol: 23.0 ml 14.08 ml/m  AORTIC VALVE                    PULMONIC VALVE AV Area (Vmax):    2.32 cm     PR End Diast Vel: 2.40 msec AV Area (Vmean):   2.13 cm AV Area (VTI):     2.09 cm AV Vmax:           95.40 cm/s AV Vmean:          63.400 cm/s AV VTI:            0.182 m AV Peak Grad:      3.6 mmHg AV Mean Grad:      2.0 mmHg LVOT Vmax:         78.10 cm/s LVOT Vmean:        47.700 cm/s LVOT VTI:          0.134 m LVOT/AV VTI ratio: 0.74  AORTA Ao Root diam: 2.80 cm MITRAL VALVE               TRICUSPID VALVE MV Area (PHT): 3.34 cm    TR Peak grad:   27.7 mmHg MV Decel Time: 227 msec    TR Vmax:        263.00 cm/s MV E velocity: 76.00 cm/s MV A velocity: 72.30 cm/s  SHUNTS MV E/A ratio:  1.05        Systemic VTI:  0.13 m                            Systemic Diam: 1.90 cm Nelva Bush MD Electronically signed by Nelva Bush MD Signature Date/Time: 10/25/2021/4:59:48 PM    Final     ECHO 03/2021 ECHOCARDIOGRAPHIC  MEASUREMENTS  2D DIMENSIONS  AORTA                  Values   Normal Range   MAIN PA         Values    Normal Range                Annulus: 1.8 cm       [2.1-2.5]         PA Main: nm*       [1.5-2.1]              Aorta Sin: nm*          [2.7-3.3]    RIGHT VENTRICLE            ST Junction: nm*          [2.3-2.9]         RV Base: nm*       [<  4.2]              Asc.Aorta: 2.8 cm       [2.3-3.1]          RV Mid: 2.3 cm    [<3.5]  LEFT VENTRICLE                                      RV Length: nm*       [<8.6]                  LVIDd: 4.3 cm       [3.9-5.3]    INFERIOR VENA CAVA                  LVIDs: 3.1 cm                        Max. IVC: nm*       [<=2.1]                     FS: 27.8 %       [>25]            Min. IVC: nm*                    SWT: 0.91 cm      [0.5-0.9]    ------------------                    PWT: 0.71 cm      [0.5-0.9]    nm* - not measured  LEFT ATRIUM                LA Diam: 3.2 cm       [2.7-3.8]            LA A4C Area: nm*          [<20]              LA Volume: nm*          [22-52]  _________________________________________________________________________________________  ECHOCARDIOGRAPHIC DESCRIPTIONS  AORTIC ROOT                   Size: Normal             Dissection: INDETERM FOR DISSECTION  AORTIC VALVE               Leaflets: Tricuspid                   Morphology: Normal               Mobility: Fully mobile  LEFT VENTRICLE                   Size: Normal                        Anterior: Normal            Contraction: Normal                         Lateral: Normal             Closest EF: >55% (Estimated)                Septal: Normal  LV Masses: No Masses                       Apical: Normal                    LVH: None                          Inferior: Normal                                                      Posterior: Normal           Dias.FxClass: (Grade 1) relaxation abnormal, E/A reversal  MITRAL VALVE               Leaflets: Normal                         Mobility: Fully mobile             Morphology: Normal  LEFT ATRIUM                   Size: Normal                       LA Masses: No masses              IA Septum: Normal IAS  MAIN PA                   Size: Normal  PULMONIC VALVE             Morphology: Normal                        Mobility: Fully mobile  RIGHT VENTRICLE              RV Masses: No Masses                         Size: Normal              Free Wall: Normal                     Contraction: Normal  TRICUSPID VALVE               Leaflets: Normal                        Mobility: Fully mobile             Morphology: Normal  RIGHT ATRIUM                   Size: Normal                        RA Other: None                RA Mass: No masses  PERICARDIUM                  Fluid: No effusion  INFERIOR VENACAVA                   Size: Normal Normal respiratory collapse  _________________________________________________________________________________________   DOPPLER ECHO and OTHER SPECIAL PROCEDURES                 Aortic: No AR                      No AS                         115.0 cm/sec peak vel      5.3 mmHg peak grad                 Mitral: MILD MR                    No MS                         MV Inflow E Vel = 71.8 cm/sec     MV Annulus E'Vel = 8.6 cm/sec                         E/E'Ratio = 8.3              Tricuspid: MILD TR                    No TS                         241.0 cm/sec peak TR vel   26.2 mmHg peak RV pressure              Pulmonary: MILD PR                    No PS                         96.2 cm/sec peak vel       3.7 mmHg peak grad  _________________________________________________________________________________________  INTERPRETATION  NORMAL LEFT VENTRICULAR SYSTOLIC FUNCTION  NORMAL RIGHT VENTRICULAR SYSTOLIC FUNCTION  MILD VALVULAR REGURGITATION (See above)  NO VALVULAR STENOSIS  _________________________________________________________________________________________   Electronically signed by         Donnelly Angelica, MD on 03/22/2021 08: 40 AM           Performed By: Maurilio Lovely, RDCS     Ordering Physician: Mickel Crow reviewed by me (LT) 10/26/2021 : Sinus rhythm rate 80s 90s  EKG reviewed by me: initial EKG showed inferior ST elevation, and repeat showing evolving inferolateral ST elevation with reciprocal changes.   Data reviewed by me (LT) 10/26/2021: Admission H&P, ED provider note, [facility] cardiology consult note, cath report, echo report, CBC, BMP, CRP, troponins  Principal Problem:   Acute pericarditis Active Problems:   Chronic kidney disease, stage 3b ([place])   Benign essential hypertension   Acute kidney injury superimposed on chronic kidney disease ([place])   Leukocytosis    ASSESSMENT AND PLAN:   Sheryl Kennedy is a 59yoF with  PMH of HFpEF (55-60% g3 dd, mi-mod TR 10/2021, prev EF >55%, G1 DD 03/2021) hypertension, hyperlipidemia, CKD 3 who presented to [facility] ED 10/25/2021 with 2 episodes of severe substernal chest pressure and associated shortness of breath prior to admission, initial EKG showed inferior ST elevation, and repeat showing evolving inferolateral ST elevation with reciprocal changes.  The on-call STEMI MD (Dr. Saunders Revel) evaluated the patient, code STEMI was activated  and she was taken emergently to the Cath Lab.  LHC revealed mild, nonobstructive CAD (20% mid RCA, 30% ostial RPDA) and normal LVEF and LVEDP. Acute pericarditis is suspected, [facility] clinic cardiology is assuming care on 10/24.  #Acute pericarditis #Nonobstructive CAD EKGs on admission showed inferolateral STE concerning for STEMI with ongoing chest discomfort, LHC revealed nonobstructive CAD /inconsistent with code STEMI.  Felt to be related to acute pericarditis with proceeding nasal congestion and cough 2 weeks ago, and with positional chest discomfort.  High-sensitivity troponin negative at 8-8. Echo did not reveal pericardial effusion, CRP is elevated (24.3),  with leukocytosis to 18,000 on day of admission.  -Continue aspirin 81 mg daily -Continue simvastatin 20 mg once daily -Continue colchicine 0.3 mg once daily (dose reduced based on renal function) -Can add ibuprofen 600 mg 3 times daily PRN for breakthrough chest pain (favor limiting this as much as possible with her CKD) in addition to Protonix 40 mg once daily. Notably, she took some ibuprofen over the weekend for her chest pain which did help (she denies any swelling or intolerance to ibuprofen, despite her "joint swelling" with naproxen)   #HFpEF (EF 55-60%, G3 DD, mild to moderate TR 10/2021) Appears compensated from a heart failure standpoint. -Continue Home GDMT includes metoprolol XL 25 mg once daily, spironolactone-HCTZ 25-25, torsemide 20 mg once daily  #CKD 3 Baseline renal function from 6/23 BUN/creatinine 46/1.7 and GFR 29 -S/p post LHC IVF, renal function stable 10/24 at BUN/creatinine 49/1.7 and GFR 31.  [place] for discharge today from a cardiac standpoint. Follow up with Dr. Nehemiah Massed in 1-2 weeks.   This patient's plan of care was discussed and created with Dr. Nehemiah Massed and he is in agreement.  Signed: Tristan Schroeder , PA-C 10/26/2021, 9:23 AM [facility]

## 2021-10-27 LAB — LIPOPROTEIN A (LPA): Lipoprotein (a): 47.5 nmol/L — ABNORMAL HIGH (ref <75.0)

## 2021-12-13 ENCOUNTER — Other Ambulatory Visit: Payer: Self-pay | Admitting: Internal Medicine

## 2021-12-13 ENCOUNTER — Ambulatory Visit: Payer: Medicare HMO | Admitting: Dermatology

## 2021-12-13 DIAGNOSIS — Z1231 Encounter for screening mammogram for malignant neoplasm of breast: Secondary | ICD-10-CM

## 2021-12-14 ENCOUNTER — Ambulatory Visit: Payer: Medicare HMO | Admitting: Dermatology

## 2022-01-05 ENCOUNTER — Other Ambulatory Visit: Payer: Self-pay | Admitting: Dermatology

## 2022-01-05 DIAGNOSIS — L219 Seborrheic dermatitis, unspecified: Secondary | ICD-10-CM

## 2022-02-21 ENCOUNTER — Ambulatory Visit
Admission: RE | Admit: 2022-02-21 | Discharge: 2022-02-21 | Disposition: A | Discharge status: Home / Self Care | Payer: Medicare HMO | Source: Ambulatory Visit | Attending: Internal Medicine | Admitting: Internal Medicine

## 2022-02-21 DIAGNOSIS — Z1231 Encounter for screening mammogram for malignant neoplasm of breast: Secondary | ICD-10-CM

## 2022-03-15 ENCOUNTER — Ambulatory Visit: Payer: Medicare HMO | Admitting: Dermatology

## 2022-03-15 ENCOUNTER — Encounter: Payer: Self-pay | Admitting: Dermatology

## 2022-03-15 VITALS — BP 116/68 | HR 84

## 2022-03-15 DIAGNOSIS — L219 Seborrheic dermatitis, unspecified: Secondary | ICD-10-CM | POA: Diagnosis not present

## 2022-03-15 DIAGNOSIS — Z5111 Encounter for antineoplastic chemotherapy: Secondary | ICD-10-CM | POA: Diagnosis not present

## 2022-03-15 DIAGNOSIS — L578 Other skin changes due to chronic exposure to nonionizing radiation: Secondary | ICD-10-CM | POA: Diagnosis not present

## 2022-03-15 DIAGNOSIS — L820 Inflamed seborrheic keratosis: Secondary | ICD-10-CM | POA: Diagnosis not present

## 2022-03-15 DIAGNOSIS — L570 Actinic keratosis: Secondary | ICD-10-CM

## 2022-03-15 DIAGNOSIS — L565 Disseminated superficial actinic porokeratosis (DSAP): Secondary | ICD-10-CM

## 2022-03-15 DIAGNOSIS — Z7189 Other specified counseling: Secondary | ICD-10-CM

## 2022-03-15 MED ORDER — FLUOROURACIL 5 % EX SOLN: CUTANEOUS | 1 refills | Status: DC

## 2022-03-15 MED ORDER — CALCIPOTRIENE 0.005 % EX SOLN: CUTANEOUS | 1 refills | Status: DC

## 2022-03-15 NOTE — Progress Notes (Signed)
Follow-Up Visit   Subjective  Sheryl Kennedy is a 77 y.o. female who presents for the following: Actinic Keratosis (5 month follow up. Vertex scalp. Tx with LN2) and Seborrheic Dermatitis (Scalp. Using Clobetasol solution, Ketoconazole 2% shampoo, and Fluocinolone oil as directed. Patient reports condition is still there).  She has quite a few scaly itchy spots on legs she wants checked.  She has h/o DSAP and uses SM chol/statin Cream.  The patient has spots, moles and lesions to be evaluated, some may be new or changing and the patient has concerns that these could be cancer.   The following portions of the chart were reviewed this encounter and updated as appropriate:      Review of Systems: No other skin or systemic complaints except as noted in HPI or Assessment and Plan.   Objective  Well appearing patient in no apparent distress; mood and affect are within normal limits.  A focused examination was performed including scalp, face, arms, legs. Relevant physical exam findings are noted in the Assessment and Plan.  Scalp Pink scaly papules/plaques with adherent scale.   Scalp x8 (8) Pink keratotic papules   legs Pink scaly macules with keratotic rim   Right Upper Arm x1, right lateral lower leg x2, right post ankle x1, left lower calf x2, left upper knee x1, right pretibia x2, right calf x3 (12) Erythematous keratotic or waxy stuck-on papule or plaque.   Assessment & Plan   Actinic Damage with PreCancerous Actinic Keratoses Counseling for Topical Chemotherapy Management: Patient exhibits: - Severe, confluent actinic changes with pre-cancerous actinic keratoses that is secondary to cumulative UV radiation exposure over time - Condition that is severe; chronic, not at goal. - diffuse scaly erythematous macules and papules with underlying dyspigmentation - Discussed Prescription "Field Treatment" topical Chemotherapy for Severe, Chronic Confluent Actinic Changes with  Pre-Cancerous Actinic Keratoses Field treatment involves treatment of an entire area of skin that has confluent Actinic Changes (Sun/ Ultraviolet light damage) and PreCancerous Actinic Keratoses by method of PhotoDynamic Therapy (PDT) and/or prescription Topical Chemotherapy agents such as 5-fluorouracil, 5-fluorouracil/calcipotriene, and/or imiquimod.  The purpose is to decrease the number of clinically evident and subclinical PreCancerous lesions to prevent progression to development of skin cancer by chemically destroying early precancer changes that may or may not be visible.  It has been shown to reduce the risk of developing skin cancer in the treated area. As a result of treatment, redness, scaling, crusting, and open sores may occur during treatment course. One or more than one of these methods may be used and may have to be used several times to control, suppress and eliminate the PreCancerous changes. Discussed treatment course, expected reaction, and possible side effects. - Recommend daily broad spectrum sunscreen SPF 30+ to sun-exposed areas, reapply every 2 hours as needed.  - Staying in the shade or wearing long sleeves, sun glasses (UVA+UVB protection) and wide brim hats (4-inch brim around the entire circumference of the hat) are also recommended. - Call for new or changing lesions.  -Start Fluorouracil 5% solution twice daily to scalp for 2 weeks. Apply 1st.  Start Calcipotriene 0.005% solution twice daily to scalp for 2 weeks. Apply 2nd.  Avoid contact with eyes. Wash hands thoroughly after use.  Reviewed course of treatment and expected reaction.  Patient advised to expect inflammation and crusting and advised that erosions are possible.  Patient advised to be diligent with sun protection during and after treatment. Handout with details of how to apply  medication and what to expect provided. Counseled to keep medication out of reach of children and pets.    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 Condition is symptomatic/ bothersome to patient. Improving but not currently at goal.   Seborrheic Dermatitis  -  is a chronic persistent rash characterized by pinkness and scaling most commonly of the mid face but also can occur on the scalp (dandruff), ears; mid chest, mid back and groin.  It tends to be exacerbated by stress and cooler weather.  People who have neurologic disease may experience new onset or exacerbation of existing seborrheic dermatitis.  The condition is not curable but treatable and can be controlled.   Continue Clobetasol 0.05 % external solution - apply topically to scalp in morning after shampoo qd for itchy scalp. Avoid face.   Continue Ketoconazole 2 % shampoo - apply topically as directed. Wash scalp 2 - 3 times weekly. Let sit 5 minutes and rinse out.    Continue Fluocinolone Acetonide body 0.01 % oil - apply topically to crown of scalp 2x/wk, cover with shower cap nightly before bed. Wash out in AM.    Topical steroids (such as triamcinolone, fluocinolone, fluocinonide, mometasone, clobetasol, halobetasol, betamethasone, hydrocortisone) can cause thinning and lightening of the skin if they are used for too long in the same area. Your physician has selected the right strength medicine for your problem and area affected on the body. Please use your medication only as directed by your physician to prevent side effects.       Related Medications Fluocinolone Acetonide Body 0.01 % OIL Apply topically to itchy scaly spot at scalp cover with shower cap qhs  ketoconazole (NIZORAL) 2 % shampoo Apply 1 Application topically as directed. Wash scalp 2-3 times weekly, Let sit 5 minutes and rinse out  clobetasol (TEMOVATE) 0.05 % external solution APPLY TOPICALLY TO SCALP IN THE MORNING AFTER SHAMPOO FOR ITCHY SCALP  AK (actinic keratosis)  (8) Scalp x8  Actinic keratoses are precancerous spots that appear secondary to cumulative UV radiation exposure/sun exposure over time. They are chronic with expected duration over 1 year. A portion of actinic keratoses will progress to squamous cell carcinoma of the skin. It is not possible to reliably predict which spots will progress to skin cancer and so treatment is recommended to prevent development of skin cancer.  Recommend daily broad spectrum sunscreen SPF 30+ to sun-exposed areas, reapply every 2 hours as needed.  Recommend staying in the shade or wearing long sleeves, sun glasses (UVA+UVB protection) and wide brim hats (4-inch brim around the entire circumference of the hat). Call for new or changing lesions.   Once areas treated today have healed, begin this treatment: Start Fluorouracil 5% solution twice daily to scalp for 2 weeks. Apply 1st.  Start Calcipotriene 0.005% solution twice daily to scalp for 2 weeks. Apply 2nd  Avoid contact with eyes. Wash hands thoroughly after use.  5-fluorouracil/calcipotriene cream is is a type of field treatment used to treat precancers, thin skin cancers, and areas of sun damage. Expected reaction includes irritation and mild inflammation potentially progressing to more severe inflammation including redness, scaling, crusting and open sores/erosions.  If too much irritation occurs, ensure application of only a thin layer and decrease frequency of use to achieve a tolerable level of inflammation. Recommend applying Vaseline ointment to open sores as needed.  Minimize sun exposure while under treatment. Recommend daily broad spectrum  sunscreen SPF 30+ to sun-exposed areas, reapply every 2 hours as needed.       Destruction of lesion - Scalp x8  Destruction method: cryotherapy   Informed consent: discussed and consent obtained   Lesion destroyed using liquid nitrogen: Yes   Region frozen until ice ball extended beyond lesion: Yes   Outcome:  patient tolerated procedure well with no complications   Post-procedure details: wound care instructions given   Additional details:  Prior to procedure, discussed risks of blister formation, small wound, skin dyspigmentation, or rare scar following cryotherapy. Recommend Vaseline ointment to treated areas while healing.   Fluorouracil 5 % SOLN - Scalp x8 Apply twice daily to affected areas on scalp for 2 weeks. Apply first  Calcipotriene 0.005 % solution - Scalp x8 Apply twice daily to scalp for 2 weeks. Apply 2nd  DSAP (disseminated superficial actinic porokeratosis) legs  Stable. Chronic and persistent condition with duration or expected duration over one year. Condition is symptomatic/ bothersome to patient. Not currently at goal. 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  Difficult to treat.  Recommend photoprotection and regular use of spf 30 or higher sunscreen to prevent worsening of condition and precancerous changes.   Continue SM Cholesterol/Lovastatin cream BID.    Inflamed seborrheic keratosis (12) Right Upper Arm x1, right lateral lower leg x2, right post ankle x1, left lower calf x2, left upper knee x1, right pretibia x2, right calf x3  Vs hypertrophic AKs  Symptomatic, irritating, patient would like treated.  Destruction of lesion - Right Upper Arm x1, right lateral lower leg x2, right post ankle x1, left lower calf x2, left upper knee x1, right pretibia x2, right calf x3  Destruction method: cryotherapy   Informed consent: discussed and consent obtained   Lesion destroyed using liquid nitrogen: Yes   Region frozen until ice ball extended beyond lesion: Yes   Outcome: patient tolerated procedure well with no complications   Post-procedure details: wound care instructions given   Additional details:  Prior to procedure, discussed risks of blister formation, small wound, skin  dyspigmentation, or rare scar following cryotherapy. Recommend Vaseline ointment to treated areas while healing.   Related Medications mupirocin ointment (BACTROBAN) 2 % Apply to affected areas qd/bid prn.   Return in about 4 weeks (around 04/12/2022) for AK Follow Up scalp.  I, Emelia Salisbury, CMA, am acting as scribe for Brendolyn Patty, MD.  Documentation: I have reviewed the above documentation for accuracy and completeness, and I agree with the above.  Brendolyn Patty MD

## 2022-03-15 NOTE — Patient Instructions (Addendum)
Cryotherapy Aftercare  Wash gently with soap and water everyday.   Apply Vaseline and Band-Aid daily until healed.    Scalp: Begin treatment once areas treated today have healed.  Start Fluorouracil 5% solution twice daily to affected areas on scalp for 2 weeks. Apply 1st.  Start Calcipotriene 0.005% solution twice daily to affected areas on scalp for 2 weeks. Apply 2nd  Avoid contact with eyes. Wash hands thoroughly after use.    Dandruff on scalp: Continue Clobetasol 0.05 % external solution - apply topically to scalp in morning after shampoo qd for itchy scalp. Avoid face.   Continue Ketoconazole 2 % shampoo - apply topically as directed. Wash scalp 2 - 3 times weekly. Let sit 5 minutes and rinse out.    Decrease Fluocinolone Acetonide body 0.01 % oil - apply topically to crown of scalp 2x/wk, cover with shower cap nightly before bed. Wash out in AM.     Legs:  Continue Cholesterol/Lovastatin cream BID.     Due to recent changes in healthcare laws, you may see results of your pathology and/or laboratory studies on MyChart before the doctors have had a chance to review them. We understand that in some cases there may be results that are confusing or concerning to you. Please understand that not all results are received at the same time and often the doctors may need to interpret multiple results in order to provide you with the best plan of care or course of treatment. Therefore, we ask that you please give [place] 2 business days to thoroughly review all your results before contacting the office for clarification. Should we see a critical lab result, you will be contacted sooner.   If You Need Anything After Your Visit  If you have any questions or concerns for your doctor, please call our main line at [phone] and press option 4 to reach your doctor's medical assistant. If no one answers, please leave a voicemail as directed and we will return your call as soon as possible.  Messages left after 4 pm will be answered the following business day.   You may also send [place] a message via [place]. We typically respond to MyChart messages within 1-2 business days.  For prescription refills, please ask your pharmacy to contact our office. Our fax number is [phone].  If you have an urgent issue when the clinic is closed that cannot wait until the next business day, you can page your doctor at the number below.    Please note that while we do our best to be available for urgent issues outside of office hours, we are not available 24/7.   If you have an urgent issue and are unable to reach [place], you may choose to seek medical care at your doctor's office, retail clinic, urgent care center, or emergency room.  If you have a medical emergency, please immediately call 911 or go to the emergency department.  Pager Numbers  - Dr. Nehemiah Massed: [phone]  - Dr. Laurence Ferrari: [phone]  - Dr. Nicole Kindred: [phone]  In the event of inclement weather, please call our main line at [phone] for an update on the status of any delays or closures.  Dermatology Medication Tips: Please keep the boxes that topical medications come in in order to help keep track of the instructions about where and how to use these. Pharmacies typically print the medication instructions only on the boxes and not directly on the medication tubes.   If your medication is too expensive, please  contact our office at [phone] option 4 or send [place] a message through [place].   We are unable to tell what your co-pay for medications will be in advance as this is different depending on your insurance coverage. However, we may be able to find a substitute medication at lower cost or fill out paperwork to get insurance to cover a needed medication.   If a prior authorization is required to get your medication covered by your insurance company, please allow [place] 1-2 business days to complete this process.  Drug  prices often vary depending on where the prescription is filled and some pharmacies may offer cheaper prices.  The website www.goodrx.com contains coupons for medications through different pharmacies. The prices here do not account for what the cost may be with help from insurance (it may be cheaper with your insurance), but the website can give you the price if you did not use any insurance.  - You can print the associated coupon and take it with your prescription to the pharmacy.  - You may also stop by our office during regular business hours and pick up a GoodRx coupon card.  - If you need your prescription sent electronically to a different pharmacy, notify our office through [facility] or by phone at [phone] option 4.     Si Usted Necesita Algo Despus de Su Visita  Tambin puede enviarnos un mensaje a travs de Pharmacist, community. Por lo general respondemos a los mensajes de MyChart en el transcurso de 1 a 2 das hbiles.  Para renovar recetas, por favor pida a su farmacia que se ponga en contacto con nuestra oficina. Harland Dingwall de fax es [place] [phone].  Si tiene un asunto urgente cuando la clnica est cerrada y que no puede esperar hasta el siguiente da hbil, puede llamar/localizar a su doctor(a) al nmero que aparece a continuacin.   Por favor, tenga en cuenta que aunque hacemos todo lo posible para estar disponibles para asuntos urgentes fuera del horario de [place], no estamos disponibles las 24 horas del da, los 7 das de la [place].   Si tiene un problema urgente y no puede comunicarse con nosotros, puede optar por buscar atencin mdica  en el consultorio de su doctor(a), en una clnica privada, en un centro de atencin urgente o en una sala de emergencias.  Si tiene Engineering geologist, por favor llame inmediatamente al 911 o vaya a la sala de emergencias.  Nmeros de bper  - Dr. Nehemiah Massed: [phone]  - Dra. Moye: [phone]  - Dra. Nicole Kindred:  [phone]  En caso de inclemencias del [place], por favor llame a Johnsie Kindred principal al [phone] para una actualizacin sobre el [place] de cualquier retraso o cierre.  Consejos para la medicacin en dermatologa: Por favor, guarde las cajas en las que vienen los medicamentos de uso tpico para ayudarle a seguir las instrucciones sobre dnde y cmo usarlos. Las farmacias generalmente imprimen las instrucciones del medicamento slo en las cajas y no directamente en los tubos del [place].   Si su medicamento es muy caro, por favor, pngase en contacto con Zigmund Daniel llamando al [phone] y presione la opcin 4 o envenos un mensaje a travs de Pharmacist, community.   No podemos decirle cul ser su copago por los medicamentos por adelantado ya que esto es diferente dependiendo de la cobertura de su seguro. Sin embargo, es posible que podamos encontrar un medicamento sustituto a Electrical engineer un formulario para que el seguro [place]  medicamento que se considera necesario.   Si se requiere una autorizacin previa para que su compaa de seguros [place] su medicamento, por favor permtanos de 1 a 2 das hbiles para completar este proceso.  Los precios de los medicamentos varan con frecuencia dependiendo del Environmental consultant de dnde se surte la receta y alguna farmacias pueden ofrecer precios ms baratos.  El sitio web www.goodrx.com tiene cupones para medicamentos de Airline pilot. Los precios aqu no tienen en cuenta lo que podra costar con la ayuda del seguro (puede ser ms barato con su seguro), pero el sitio web puede darle el precio si no utiliz Research scientist (physical sciences).  - Puede imprimir el cupn correspondiente y llevarlo con su receta a la farmacia.  - Tambin puede pasar por nuestra oficina durante el horario de atencin regular y Charity fundraiser una tarjeta de cupones de GoodRx.  - Si necesita que su receta se enve electrnicamente a una farmacia diferente, informe a nuestra oficina a travs de  MyChart de [place] o por telfono llamando al [phone] y presione la opcin 4.

## 2022-04-13 ENCOUNTER — Ambulatory Visit: Payer: Medicare HMO | Admitting: Dermatology

## 2022-04-13 VITALS — BP 114/65 | HR 69

## 2022-04-13 DIAGNOSIS — L570 Actinic keratosis: Secondary | ICD-10-CM | POA: Diagnosis not present

## 2022-04-13 DIAGNOSIS — L219 Seborrheic dermatitis, unspecified: Secondary | ICD-10-CM | POA: Diagnosis not present

## 2022-04-13 DIAGNOSIS — L820 Inflamed seborrheic keratosis: Secondary | ICD-10-CM | POA: Diagnosis not present

## 2022-04-13 DIAGNOSIS — L300 Nummular dermatitis: Secondary | ICD-10-CM | POA: Diagnosis not present

## 2022-04-13 DIAGNOSIS — L578 Other skin changes due to chronic exposure to nonionizing radiation: Secondary | ICD-10-CM | POA: Diagnosis not present

## 2022-04-13 DIAGNOSIS — Z5111 Encounter for antineoplastic chemotherapy: Secondary | ICD-10-CM

## 2022-04-13 DIAGNOSIS — Z7189 Other specified counseling: Secondary | ICD-10-CM

## 2022-04-13 MED ORDER — TRIAMCINOLONE ACETONIDE 0.1 % EX CREA: TOPICAL_CREAM | CUTANEOUS | 2 refills | Status: DC

## 2022-04-13 NOTE — Progress Notes (Signed)
Follow-Up Visit   Subjective  Sheryl Kennedy is a 77 y.o. female who presents for the following: Actinic keratosis of the scalp, 1 month follow-up. She used 5FU and Calcipotriene solution to scalp for 11 days twice daily for each. Areas burn when using the meds.   She also has itchy skin on the thighs and would like a refill of TMC Cream. She uses about once a week. She also has an itchy spot on her L hand.   The following portions of the chart were reviewed this encounter and updated as appropriate: medications, allergies, medical history  Review of Systems:  No other skin or systemic complaints except as noted in HPI or Assessment and Plan.  Objective  Well appearing patient in no apparent distress; mood and affect are within normal limits.  A focused examination was performed of the following areas: Face, scalp  Relevant exam findings are noted in the Assessment and Plan.    Assessment & Plan   ACTINIC DAMAGE WITH PRECANCEROUS ACTINIC KERATOSES Counseling for Topical Chemotherapy Management: Patient exhibits: - Severe, confluent actinic changes with pre-cancerous actinic keratoses that is secondary to cumulative UV radiation exposure over time - Condition that is severe; chronic, not at goal. - diffuse scaly erythematous macules and papules with underlying dyspigmentation - Discussed Prescription "Field Treatment" topical Chemotherapy for Severe, Chronic Confluent Actinic Changes with Pre-Cancerous Actinic Keratoses Field treatment involves treatment of an entire area of skin that has confluent Actinic Changes (Sun/ Ultraviolet light damage) and PreCancerous Actinic Keratoses by method of PhotoDynamic Therapy (PDT) and/or prescription Topical Chemotherapy agents such as 5-fluorouracil, 5-fluorouracil/calcipotriene, and/or imiquimod.  The purpose is to decrease the number of clinically evident and subclinical PreCancerous lesions to prevent progression to development of skin  cancer by chemically destroying early precancer changes that may or may not be visible.  It has been shown to reduce the risk of developing skin cancer in the treated area. As a result of treatment, redness, scaling, crusting, and open sores may occur during treatment course. One or more than one of these methods may be used and may have to be used several times to control, suppress and eliminate the PreCancerous changes. Discussed treatment course, expected reaction, and possible side effects. - Recommend daily broad spectrum sunscreen SPF 30+ to sun-exposed areas, reapply every 2 hours as needed.  - Staying in the shade or wearing long sleeves, sun glasses (UVA+UVB protection) and wide brim hats (4-inch brim around the entire circumference of the hat) are also recommended. - Call for new or changing lesions. - Good reaction from 5FU solution and Calcipotriene solution to the scalp, continue using for 3 more days, or until 5FU bottle is empty.    ACTINIC KERATOSIS  Exam: Erythema with crusted papules of the posterior crown scalp.  Actinic keratoses are precancerous spots that appear secondary to cumulative UV radiation exposure/sun exposure over time. They are chronic with expected duration over 1 year. A portion of actinic keratoses will progress to squamous cell carcinoma of the skin. It is not possible to reliably predict which spots will progress to skin cancer and so treatment is recommended to prevent development of skin cancer.  Recommend daily broad spectrum sunscreen SPF 30+ to sun-exposed areas, reapply every 2 hours as needed.  Recommend staying in the shade or wearing long sleeves, sun glasses (UVA+UVB protection) and wide brim hats (4-inch brim around the entire circumference of the hat). Call for new or changing lesions.  Treatment Plan: Good reaction from  5FU solution and Calcipotriene solution to the scalp, continue using for 3 more days, or until 5FU bottle is empty.    5-fluorouracil/calcipotriene cream is is a type of field treatment used to treat precancers, thin skin cancers, and areas of sun damage. Expected reaction includes irritation and mild inflammation potentially progressing to more severe inflammation including redness, scaling, crusting and open sores/erosions.  If too much irritation occurs, ensure application of only a thin layer and decrease frequency of use to achieve a tolerable level of inflammation. Recommend applying Vaseline ointment to open sores as needed.  Minimize sun exposure while under treatment. Recommend daily broad spectrum sunscreen SPF 30+ to sun-exposed areas, reapply every 2 hours as needed.   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 Avoid face, groin, axilla.  Topical steroids (such as triamcinolone, fluocinolone, fluocinonide, mometasone, clobetasol, halobetasol, betamethasone, hydrocortisone) can cause thinning and lightening of the skin if they are used for too long in the same area. Your physician has selected the right strength medicine for your problem and area affected on the body. Please use your medication only as directed by your physician to prevent side effects.   INFLAMED SEBORRHEIC KERATOSIS Exam: Erythematous keratotic or waxy stuck-on papule or plaque.  Symptomatic, irritating, patient would like treated.  Benign-appearing.  Call clinic for new or changing lesions.   Prior to procedure, discussed risks of blister formation, small wound, skin dyspigmentation, or rare scar following treatment. Recommend Vaseline ointment to treated areas while healing.  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 Condition is symptomatic/ bothersome to patient. Improving but not currently at goal.   Seborrheic Dermatitis  -  is a chronic persistent rash characterized by pinkness and scaling most commonly of the mid face but also can occur on the scalp (dandruff), ears; mid chest, mid back and groin.  It tends to be exacerbated by stress and cooler weather.  People who have neurologic disease may experience new onset or exacerbation of existing seborrheic dermatitis.  The condition is not curable but treatable and can be controlled.   Continue Clobetasol 0.05 % external solution - apply topically to scalp in morning after shampoo qd prn for itchy scalp. Avoid face.   Continue Ketoconazole 2 % shampoo - apply topically as directed. Wash scalp 2 - 3 times weekly. Let sit 5 minutes and rinse out.    Continue Fluocinolone Acetonide body 0.01 % oil - apply topically to crown of scalp 2x/wk prn, cover with shower cap nightly before bed. Wash out in AM.    Topical steroids (such as triamcinolone, fluocinolone, fluocinonide, mometasone, clobetasol, halobetasol, betamethasone, hydrocortisone) can cause thinning and lightening of the skin if they are used for too long in the same area. Your physician has selected the right strength medicine for your problem and area affected on the body. Please use your medication only as directed by your physician to prevent side effects.    Return in about 1 month (around 05/13/2022) for AKs.  ICherlyn Labella, CMA, am acting as scribe for Willeen Niece, MD .  Documentation: I have reviewed the above documentation for accuracy and completeness, and I agree with the above.  Brendolyn Patty, MD

## 2022-04-13 NOTE — Patient Instructions (Addendum)
5-fluorouracil/calcipotriene cream is is a type of field treatment used to treat precancers, thin skin cancers, and areas of sun damage. Expected reaction includes irritation and mild inflammation potentially progressing to more severe inflammation including redness, scaling, crusting and open sores/erosions.  If too much irritation occurs, ensure application of only a thin layer and decrease frequency of use to achieve a tolerable level of inflammation. Recommend applying Vaseline ointment to open sores as needed.  Minimize sun exposure while under treatment. Recommend daily broad spectrum sunscreen SPF 30+ to sun-exposed areas, reapply every 2 hours as needed.    Cryotherapy Aftercare  Wash gently with soap and water everyday.   Apply Vaseline and Band-Aid daily until healed.    Due to recent changes in healthcare laws, you may see results of your pathology and/or laboratory studies on MyChart before the doctors have had a chance to review them. We understand that in some cases there may be results that are confusing or concerning to you. Please understand that not all results are received at the same time and often the doctors may need to interpret multiple results in order to provide you with the best plan of care or course of treatment. Therefore, we ask that you please give [place] 2 business days to thoroughly review all your results before contacting the office for clarification. Should we see a critical lab result, you will be contacted sooner.   If You Need Anything After Your Visit  If you have any questions or concerns for your doctor, please call our main line at [phone] and press option 4 to reach your doctor's medical assistant. If no one answers, please leave a voicemail as directed and we will return your call as soon as possible. Messages left after 4 pm will be answered the following business day.   You may also send [place] a message via MyChart. We typically respond to MyChart messages  within 1-2 business days.  For prescription refills, please ask your pharmacy to contact our office. Our fax number is [phone].  If you have an urgent issue when the clinic is closed that cannot wait until the next business day, you can page your doctor at the number below.    Please note that while we do our best to be available for urgent issues outside of office hours, we are not available 24/7.   If you have an urgent issue and are unable to reach [place], you may choose to seek medical care at your doctor's office, retail clinic, urgent care center, or emergency room.  If you have a medical emergency, please immediately call 911 or go to the emergency department.  Pager Numbers  - Dr. Gwen Pounds: [phone]  - Dr. Neale Burly: [phone]  - Dr. Roseanne Reno: [phone]  In the event of inclement weather, please call our main line at [phone] for an update on the status of any delays or closures.  Dermatology Medication Tips: Please keep the boxes that topical medications come in in order to help keep track of the instructions about where and how to use these. Pharmacies typically print the medication instructions only on the boxes and not directly on the medication tubes.   If your medication is too expensive, please contact our office at [phone] option 4 or send [place] a message through MyChart.   We are unable to tell what your co-pay for medications will be in advance as this is different depending on your insurance coverage. However, we may be able to find a substitute  medication at lower cost or fill out paperwork to get insurance to cover a needed medication.   If a prior authorization is required to get your medication covered by your insurance company, please allow [place] 1-2 business days to complete this process.  Drug prices often vary depending on where the prescription is filled and some pharmacies may offer cheaper prices.  The website www.goodrx.com contains coupons for  medications through different pharmacies. The prices here do not account for what the cost may be with help from insurance (it may be cheaper with your insurance), but the website can give you the price if you did not use any insurance.  - You can print the associated coupon and take it with your prescription to the pharmacy.  - You may also stop by our office during regular business hours and pick up a GoodRx coupon card.  - If you need your prescription sent electronically to a different pharmacy, notify our office through [facility] or by phone at [phone] option 4.     Si Usted Necesita Algo Despus de Su Visita  Tambin puede enviarnos un mensaje a travs de Pharmacist, community. Por lo general respondemos a los mensajes de MyChart en el transcurso de 1 a 2 das hbiles.  Para renovar recetas, por favor pida a su farmacia que se ponga en contacto con nuestra oficina. Harland Dingwall de fax es [place] [phone].  Si tiene un asunto urgente cuando la clnica est cerrada y que no puede esperar hasta el siguiente da hbil, puede llamar/localizar a su doctor(a) al nmero que aparece a continuacin.   Por favor, tenga en cuenta que aunque hacemos todo lo posible para estar disponibles para asuntos urgentes fuera del horario de [place], no estamos disponibles las 24 horas del da, los 7 das de la [place].   Si tiene un problema urgente y no puede comunicarse con nosotros, puede optar por buscar atencin mdica  en el consultorio de su doctor(a), en una clnica privada, en un centro de atencin urgente o en una sala de emergencias.  Si tiene Engineering geologist, por favor llame inmediatamente al 911 o vaya a la sala de emergencias.  Nmeros de bper  - Dr. Nehemiah Massed: [phone]  - Dra. Moye: [phone]  - Dra. Nicole Kindred: [phone]  En caso de inclemencias del [place], por favor llame a Johnsie Kindred principal al [phone] para una actualizacin sobre el [place] de cualquier retraso o  cierre.  Consejos para la medicacin en dermatologa: Por favor, guarde las cajas en las que vienen los medicamentos de uso tpico para ayudarle a seguir las instrucciones sobre dnde y cmo usarlos. Las farmacias generalmente imprimen las instrucciones del medicamento slo en las cajas y no directamente en los tubos del [place].   Si su medicamento es muy caro, por favor, pngase en contacto con Zigmund Daniel llamando al [phone] y presione la opcin 4 o envenos un mensaje a travs de Pharmacist, community.   No podemos decirle cul ser su copago por los medicamentos por adelantado ya que esto es diferente dependiendo de la cobertura de su seguro. Sin embargo, es posible que podamos encontrar un medicamento sustituto a Electrical engineer un formulario para que el seguro cubra el medicamento que se considera necesario.   Si se requiere una autorizacin previa para que su compaa de seguros [place] su medicamento, por favor permtanos de 1 a 2 das hbiles para completar este proceso.  Los precios de los medicamentos varan con frecuencia dependiendo del Environmental consultant de  dnde se surte la receta y alguna farmacias pueden ofrecer precios ms baratos.  El sitio web www.goodrx.com tiene cupones para medicamentos de Airline pilot. Los precios aqu no tienen en cuenta lo que podra costar con la ayuda del seguro (puede ser ms barato con su seguro), pero el sitio web puede darle el precio si no utiliz Research scientist (physical sciences).  - Puede imprimir el cupn correspondiente y llevarlo con su receta a la farmacia.  - Tambin puede pasar por nuestra oficina durante el horario de atencin regular y Charity fundraiser una tarjeta de cupones de GoodRx.  - Si necesita que su receta se enve electrnicamente a una farmacia diferente, informe a nuestra oficina a travs de MyChart de [place] o por telfono llamando al [phone] y presione la opcin 4.

## 2022-04-18 ENCOUNTER — Encounter: Payer: Self-pay | Admitting: Ophthalmology

## 2022-04-18 NOTE — Anesthesia Preprocedure Evaluation (Addendum)
Anesthesia Evaluation  Patient identified by MRN, date of birth, ID band Patient awake    Reviewed: Allergy & Precautions, H&P , NPO status , Patient's Chart, lab work & pertinent test results  Airway Mallampati: II  TM Distance: >3 FB Neck ROM: Full    Dental no notable dental hx.    Pulmonary neg pulmonary ROS   Pulmonary exam normal breath sounds clear to auscultation       Cardiovascular hypertension, Normal cardiovascular exam Rhythm:Regular Rate:Normal     Neuro/Psych negative neurological ROS  negative psych ROS   GI/Hepatic negative GI ROS, Neg liver ROS,,,  Endo/Other  negative endocrine ROS    Renal/GU Renal diseasenegative Renal ROSChronic kidney disease 3b  negative genitourinary   Musculoskeletal  (+) Arthritis ,  gout   Abdominal   Peds negative pediatric ROS (+)  Hematology negative hematology ROS (+)   Anesthesia Other Findings Hypertension  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 complications Arthritis  Hyperlipidemia Hx of seasonal allergies  Gout DSAP (disseminated superficial actinic porokeratosis) Cancer Nosebleed today Squamous cell carcinoma of skin     Reproductive/Obstetrics negative OB ROS                             Anesthesia Physical Anesthesia Plan  ASA: 3  Anesthesia Plan: MAC   Post-op Pain Management:    Induction: Intravenous  PONV Risk Score and Plan:   Airway Management Planned: Natural Airway and Nasal Cannula  Additional Equipment:   Intra-op Plan:   Post-operative Plan:   Informed Consent: I have reviewed the patients History and Physical, chart, labs and discussed the procedure including the risks, benefits and alternatives for the proposed anesthesia with the patient or authorized representative who has indicated his/her understanding and acceptance.     Dental Advisory Given  Plan Discussed with:  Anesthesiologist, CRNA and Surgeon  Anesthesia Plan Comments: (Patient consented for risks of anesthesia including but not limited to:  - adverse reactions to medications - damage to eyes, teeth, lips or other oral mucosa - nerve damage due to positioning  - sore throat or hoarseness - Damage to heart, brain, nerves, lungs, other parts of body or loss of life  Patient voiced understanding.)       Anesthesia Quick Evaluation

## 2022-04-20 NOTE — Discharge Instructions (Signed)

## 2022-04-25 ENCOUNTER — Ambulatory Visit
Admission: RE | Admit: 2022-04-25 | Discharge: 2022-04-25 | Disposition: A | Discharge status: Home / Self Care | Payer: Medicare HMO | Attending: Ophthalmology | Admitting: Ophthalmology

## 2022-04-25 ENCOUNTER — Ambulatory Visit: Payer: Medicare HMO | Admitting: Anesthesiology

## 2022-04-25 ENCOUNTER — Encounter
Admission: RE | Disposition: A | Discharge status: Home / Self Care | Payer: Self-pay | Source: Home / Self Care | Attending: Ophthalmology

## 2022-04-25 ENCOUNTER — Other Ambulatory Visit: Payer: Self-pay

## 2022-04-25 ENCOUNTER — Encounter: Payer: Self-pay | Admitting: Ophthalmology

## 2022-04-25 DIAGNOSIS — M1990 Unspecified osteoarthritis, unspecified site: Secondary | ICD-10-CM | POA: Diagnosis not present

## 2022-04-25 DIAGNOSIS — N1832 Chronic kidney disease, stage 3b: Secondary | ICD-10-CM | POA: Diagnosis not present

## 2022-04-25 DIAGNOSIS — M109 Gout, unspecified: Secondary | ICD-10-CM | POA: Diagnosis not present

## 2022-04-25 DIAGNOSIS — H2512 Age-related nuclear cataract, left eye: Secondary | ICD-10-CM | POA: Diagnosis not present

## 2022-04-25 DIAGNOSIS — I129 Hypertensive chronic kidney disease with stage 1 through stage 4 chronic kidney disease, or unspecified chronic kidney disease: Secondary | ICD-10-CM | POA: Diagnosis not present

## 2022-04-25 HISTORY — PX: CATARACT EXTRACTION W/PHACO: SHX586

## 2022-04-25 SURGERY — PHACOEMULSIFICATION, CATARACT, WITH IOL INSERTION
Anesthesia: Monitor Anesthesia Care | Site: Eye | Laterality: Left

## 2022-04-25 MED ORDER — MOXIFLOXACIN HCL 0.5 % OP SOLN
OPHTHALMIC | Status: DC | PRN
  Administered 2022-04-25: .2 mL via OPHTHALMIC

## 2022-04-25 MED ORDER — LACTATED RINGERS IV SOLN: INTRAVENOUS | Status: DC

## 2022-04-25 MED ORDER — SIGHTPATH DOSE#1 BSS IO SOLN
INTRAOCULAR | Status: DC | PRN
  Administered 2022-04-25: 71 mL via OPHTHALMIC

## 2022-04-25 MED ORDER — SIGHTPATH DOSE#1 NA HYALUR & NA CHOND-NA HYALUR IO KIT
PACK | INTRAOCULAR | Status: DC | PRN
  Administered 2022-04-25: 1 via OPHTHALMIC

## 2022-04-25 MED ORDER — LIDOCAINE HCL (PF) 2 % IJ SOLN
INTRAOCULAR | Status: DC | PRN
  Administered 2022-04-25: 4 mL via INTRAOCULAR

## 2022-04-25 MED ORDER — SIGHTPATH DOSE#1 BSS IO SOLN
INTRAOCULAR | Status: DC | PRN
  Administered 2022-04-25: 15 mL via INTRAOCULAR

## 2022-04-25 MED ORDER — MIDAZOLAM HCL 2 MG/2ML IJ SOLN
INTRAMUSCULAR | Status: DC | PRN
  Administered 2022-04-25: 1 mg via INTRAVENOUS

## 2022-04-25 MED ORDER — TETRACAINE HCL 0.5 % OP SOLN
1.0000 [drp] | OPHTHALMIC | Status: DC | PRN
  Administered 2022-04-25 (×3): 1 [drp] via OPHTHALMIC

## 2022-04-25 MED ORDER — FENTANYL CITRATE (PF) 100 MCG/2ML IJ SOLN
INTRAMUSCULAR | Status: DC | PRN
  Administered 2022-04-25: 50 ug via INTRAVENOUS

## 2022-04-25 MED ORDER — ARMC OPHTHALMIC DILATING DROPS
1.0000 | OPHTHALMIC | Status: DC | PRN
  Administered 2022-04-25 (×3): 1 via OPHTHALMIC

## 2022-04-25 SURGICAL SUPPLY — 18 items
CANNULA ANT/CHMB 27G (MISCELLANEOUS) IMPLANT
CANNULA ANT/CHMB 27GA (MISCELLANEOUS) IMPLANT
CATARACT SUITE SIGHTPATH (MISCELLANEOUS) ×1 IMPLANT
DISSECTOR HYDRO NUCLEUS 50X22 (MISCELLANEOUS) ×1 IMPLANT
FEE CATARACT SUITE SIGHTPATH (MISCELLANEOUS) ×1 IMPLANT
GLOVE SURG GAMMEX PI TX LF 7.5 (GLOVE) ×1 IMPLANT
GLOVE SURG SYN 8.5  E (GLOVE) ×1
GLOVE SURG SYN 8.5 E (GLOVE) ×1 IMPLANT
GLOVE SURG SYN 8.5 PF PI (GLOVE) ×1 IMPLANT
LENS IOL TECNIS EYHANCE 23.0 (Intraocular Lens) IMPLANT
NDL FILTER BLUNT 18X1 1/2 (NEEDLE) ×1 IMPLANT
NEEDLE FILTER BLUNT 18X1 1/2 (NEEDLE) ×1 IMPLANT
PACK VIT ANT 23G (MISCELLANEOUS) IMPLANT
RING MALYGIN (MISCELLANEOUS) IMPLANT
SUT ETHILON 10-0 CS-B-6CS-B-6 (SUTURE)
SUTURE EHLN 10-0 CS-B-6CS-B-6 (SUTURE) IMPLANT
SYR 3ML LL SCALE MARK (SYRINGE) ×1 IMPLANT
SYR 5ML LL (SYRINGE) ×1 IMPLANT

## 2022-04-25 NOTE — Op Note (Signed)
OPERATIVE NOTE  ALDINA PORTA [id number] 04/25/2022   PREOPERATIVE DIAGNOSIS:  Nuclear sclerotic cataract left eye.  H25.12   POSTOPERATIVE DIAGNOSIS:    Nuclear sclerotic cataract left eye.     PROCEDURE:  Phacoemusification with posterior chamber intraocular lens placement of the left eye   LENS:   Implant Name Type Inv. Item Serial No. Manufacturer Lot No. LRB No. Used Action  LENS IOL TECNIS EYHANCE 23.0 - [id number] Intraocular Lens LENS IOL TECNIS EYHANCE 23.0 [id number] [employer]  Left 1 Implanted      Procedure(s): CATARACT EXTRACTION PHACO AND INTRAOCULAR LENS PLACEMENT (IOC) LEFT 3.42 00:25.2 (Left)  DIB00 +23.0   ULTRASOUND TIME: 0 minutes 25 seconds.  CDE 3.42   SURGEON:  Willey Blade, MD, MPH   ANESTHESIA:  Topical with tetracaine drops augmented with 1% preservative-free intracameral lidocaine.  ESTIMATED BLOOD LOSS: <1 mL   COMPLICATIONS:  None.   DESCRIPTION OF PROCEDURE:  The patient was identified in the holding room and transported to the operating room and placed in the supine position under the operating microscope.  The left eye was identified as the operative eye and it was prepped and draped in the usual sterile ophthalmic fashion.   A 1.0 millimeter clear-corneal paracentesis was made at the 5:00 position. 0.5 ml of preservative-free 1% lidocaine with epinephrine was injected into the anterior chamber.  The anterior chamber was filled with viscoelastic.  A 2.4 millimeter keratome was used to make a near-clear corneal incision at the 2:00 position.  A curvilinear capsulorrhexis was made with a cystotome and capsulorrhexis forceps.  Balanced salt solution was used to hydrodissect and hydrodelineate the nucleus.   Phacoemulsification was then used in stop and chop fashion to remove the lens nucleus and epinucleus.  The remaining cortex was then removed using the irrigation and aspiration handpiece. Viscoelastic was then placed into the capsular bag to  distend it for lens placement.  A lens was then injected into the capsular bag.  The remaining viscoelastic was aspirated.   Wounds were hydrated with balanced salt solution.  The anterior chamber was inflated to a physiologic pressure with balanced salt solution.  Intracameral vigamox 0.1 mL undiltued was injected into the eye and a drop placed onto the ocular surface.  No wound leaks were noted.  The patient was taken to the recovery room in stable condition without complications of anesthesia or surgery  Willey Blade 04/25/2022, 9:41 AM

## 2022-04-25 NOTE — Transfer of Care (Signed)
Immediate Anesthesia Transfer of Care Note  Patient: Sheryl Kennedy  Procedure(s) Performed: CATARACT EXTRACTION PHACO AND INTRAOCULAR LENS PLACEMENT (IOC) LEFT 3.42 00:25.2 (Left: Eye)  Patient Location: PACU  Anesthesia Type: MAC  Level of Consciousness: awake, alert  and patient cooperative  Airway and Oxygen Therapy: Patient Spontanous Breathing and Patient connected to supplemental oxygen  Post-op Assessment: Post-op Vital signs reviewed, Patient's Cardiovascular Status Stable, Respiratory Function Stable, Patent Airway and No signs of Nausea or vomiting  Post-op Vital Signs: Reviewed and stable  Complications: No notable events documented.

## 2022-04-25 NOTE — Anesthesia Postprocedure Evaluation (Signed)
Anesthesia Post Note  Patient: Sheryl Kennedy  Procedure(s) Performed: CATARACT EXTRACTION PHACO AND INTRAOCULAR LENS PLACEMENT (IOC) LEFT 3.42 00:25.2 (Left: Eye)  Patient location during evaluation: PACU Anesthesia Type: MAC Level of consciousness: awake and alert Pain management: pain level controlled Vital Signs Assessment: post-procedure vital signs reviewed and stable Respiratory status: spontaneous breathing, nonlabored ventilation, respiratory function stable and patient connected to nasal cannula oxygen Cardiovascular status: stable and blood pressure returned to baseline Postop Assessment: no apparent nausea or vomiting Anesthetic complications: no   No notable events documented.   Last Vitals:  Vitals:   04/25/22 0942 04/25/22 0948  BP: 106/62 (!) 104/55  Pulse: 77 73  Resp: (!) 9 13  Temp: (!) 36.3 C (!) 36.3 C  SpO2: 100% 99%    Last Pain:  Vitals:   04/25/22 0948  TempSrc:   PainSc: 0-No pain                 Marisue Humble

## 2022-04-25 NOTE — H&P (Signed)
[facility] - Closed   Primary Care Physician:  Marguarite Arbour, MD Ophthalmologist: Dr. Willey Blade  Pre-Procedure History & Physical: HPI:  Sheryl Kennedy is a 77 y.o. female here for cataract surgery.   Past Medical History:  Diagnosis Date   Arthritis    Cancer 08/2012   skin right anterior lower leg, squamous cell   DSAP (disseminated superficial actinic porokeratosis)    Gout    Hx of seasonal allergies    Hyperlipidemia    Hypertension    Squamous cell carcinoma of skin    R anterior lower leg   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 complications     Past Surgical History:  Procedure Laterality Date   BACK SURGERY  2008   no metal in back   BREAST BIOPSY Right 09/13/2010   core/neg   BREAST BIOPSY Right 12/09/2015   US biopsy/neg   BREAST SURGERY Right 2012   CHONDROPLASTY Left 01/22/2016   Procedure: arthroscopic medial AND LATERAL CHONDROPLASTY;  Surgeon: Donato Heinz, MD;  Location: [facility] ORS;  Service: Orthopedics;  Laterality: Left;   COLONOSCOPY  2011   COLONOSCOPY WITH PROPOFOL N/A 04/14/2015   Procedure: COLONOSCOPY WITH PROPOFOL;  Surgeon: Christena Deem, MD;  Location: [facility] ENDOSCOPY;  Service: Endoscopy;  Laterality: N/A;   CYST REMOVAL HAND Left 2012   JOINT REPLACEMENT Right 09/30/2013   TOTAL KNEE REPLACEMENT, DR. Ernest Pine, [facility]   KNEE ARTHROSCOPY WITH MEDIAL MENISECTOMY Left 01/22/2016   Procedure: KNEE ARTHROSCOPY WITH MEDIAL AND LATERAL MENISECTOMY;  Surgeon: Donato Heinz, MD;  Location: [facility] ORS;  Service: Orthopedics;  Laterality: Left;   KNEE SURGERY  1997   LEFT HEART CATH AND CORONARY ANGIOGRAPHY N/A 10/25/2021   Procedure: LEFT HEART CATH AND CORONARY ANGIOGRAPHY;  Surgeon: Yvonne Kendall, MD;  Location: [facility] INVASIVE CV LAB;  Service: Cardiovascular;  Laterality: N/A;   Stab pheblectomy  Left 2013   TONSILLECTOMY     VEIN CLOSURE Bilateral 2012    Prior to Admission medications   Medication Sig Start Date End Date Taking?  Authorizing Provider  allopurinol (ZYLOPRIM) 300 MG tablet Take 300 mg by mouth daily.   Yes [provider]  aspirin 325 MG tablet Take 325 mg by mouth daily.   Yes [provider]  Calcipotriene 0.005 % solution Apply twice daily to scalp for 2 weeks. Apply 2nd 03/15/22  Yes Willeen Niece, MD  Calcium Carbonate-Vit D-Min (CALCIUM 600+D PLUS MINERALS PO) Take 1 tablet by mouth daily.   Yes [provider]  Cholecalciferol (VITAMIN D3) 25 MCG (1000 UT) CAPS Take by mouth.   Yes [provider]  clobetasol (TEMOVATE) 0.05 % external solution APPLY TOPICALLY TO SCALP IN THE MORNING AFTER SHAMPOO FOR ITCHY SCALP 01/05/22  Yes Willeen Niece, MD  Cyanocobalamin 1000 MCG TBCR Take 1 tablet by mouth daily.    Yes [provider]  docusate sodium (COLACE) 100 MG capsule Take 100 mg by mouth at bedtime.   Yes [provider]  fexofenadine (ALLEGRA) 180 MG tablet Take 180 mg by mouth daily as needed for allergies.   Yes [provider]  Fluocinolone Acetonide Body 0.01 % OIL Apply topically to itchy scaly spot at scalp cover with shower cap qhs 09/14/21  Yes Willeen Niece, MD  Fluorouracil 5 % SOLN Apply twice daily to affected areas on scalp for 2 weeks. Apply first 03/15/22  Yes Willeen Niece, MD  fluticasone [facility]) 50 MCG/ACT nasal spray Place 1 spray into both  nostrils daily as needed for allergies or rhinitis.   Yes [provider]  ketoconazole (NIZORAL) 2 % shampoo Apply 1 Application topically as directed. Wash scalp 2-3 times weekly, Let sit 5 minutes and rinse out 09/14/21  Yes Willeen Niece, MD  levothyroxine (SYNTHROID) 50 MCG tablet Take 50 mcg by mouth daily before breakfast.   Yes [provider]  metoprolol succinate (TOPROL-XL) 25 MG 24 hr tablet Take 25 mg by mouth daily.  04/26/13  Yes [provider]  mupirocin ointment (BACTROBAN) 2 % Apply to affected areas qd/bid prn. 06/07/21  Yes Willeen Niece, MD   Nutritional Supplements (ESTROVEN PM PO) Take 0.5 tablets by mouth at bedtime. For hotflashes   Yes [provider]  potassium chloride SA (K-DUR,KLOR-CON) 20 MEQ tablet Take 20 mEq by mouth 3 (three) times daily.  03/28/13  Yes [provider]  simvastatin (ZOCOR) 40 MG tablet Take 20 mg by mouth daily at 6 PM.  04/08/13  Yes [provider]  spironolactone-hydrochlorothiazide (ALDACTAZIDE) 25-25 MG tablet Take 1 tablet by mouth daily.   Yes [provider]  torsemide (DEMADEX) 20 MG tablet Take 20 mg by mouth daily.  04/26/13  Yes [provider]  traMADol (ULTRAM) 50 MG tablet Take 50 mg by mouth 4 (four) times daily.   Yes [provider]  triamcinolone cream (KENALOG) 0.1 % Apply to affected areas rash on body once to twice daily until improved. Avoid face, groin, axilla. 04/13/22  Yes Willeen Niece, MD  colchicine 0.6 MG tablet Take 0.5 tablets (0.3 mg total) by mouth daily for 7 days. 10/27/21 11/03/21  Marrion Coy, MD    Allergies as of 04/12/2022 - Review Complete 03/15/2022  Allergen Reaction Noted   Naproxen Swelling 06/12/2014   Pseudoephedrine Rash and Other (See Comments) 05/02/2013   Shellfish allergy Other (See Comments) 09/17/2012   Sulfa antibiotics Rash 06/01/2012    Family History  Problem Relation Age of Onset   Heart failure Mother    Heart disease Mother    Arthritis Mother    Colon cancer Paternal Grandmother    Breast cancer Neg Hx     Social History   Socioeconomic History   Marital status: Married    Spouse name: Not on file   Number of children: Not on file   Years of education: Not on file   Highest education level: Not on file  Occupational History   Not on file  Tobacco Use   Smoking status: Never   Smokeless tobacco: Never  Substance and Sexual Activity   Alcohol use: Yes    Comment: occassional   Drug use: No   Sexual activity: Not Currently  Other Topics Concern   Not on file  Social  History Narrative   Not on file   Social Determinants of Health   Financial Resource Strain: Not on file  Food Insecurity: No Food Insecurity (10/25/2021)   Hunger Vital Sign    Worried About Running Out of Food in the Last Year: Never true    Ran Out of Food in the Last Year: Never true  Transportation Needs: No Transportation Needs (10/25/2021)   PRAPARE - Administrator, Civil Service (Medical): No    Lack of Transportation (Non-Medical): No  Physical Activity: Not on file  Stress: Not on file  Social Connections: Not on file  Intimate Partner Violence: Not At Risk (10/25/2021)   Humiliation, Afraid, Rape, and Kick questionnaire    Fear of Current  or Ex-Partner: No    Emotionally Abused: No    Physically Abused: No    Sexually Abused: No    Review of Systems: See HPI, otherwise negative ROS  Physical Exam: BP 126/62   Temp (!) 97 F (36.1 C) (Tympanic)   Resp 14   Ht 5' 0.98" (1.549 m)   Wt 61.7 kg   SpO2 96%   BMI 25.73 kg/m  General:   Alert, cooperative in NAD Head:  Normocephalic and atraumatic. Respiratory:  Normal work of breathing. Cardiovascular:  RRR  Impression/Plan: Sheryl Kennedy is here for cataract surgery.  Risks, benefits, limitations, and alternatives regarding cataract surgery have been reviewed with the patient.  Questions have been answered.  All parties agreeable.   Willey Blade, MD  04/25/2022, 9:15 AM

## 2022-04-26 ENCOUNTER — Encounter: Payer: Self-pay | Admitting: Ophthalmology

## 2022-04-26 ENCOUNTER — Other Ambulatory Visit: Payer: Self-pay

## 2022-05-16 ENCOUNTER — Encounter: Payer: Self-pay | Admitting: Ophthalmology

## 2022-05-17 ENCOUNTER — Ambulatory Visit: Payer: Medicare HMO | Admitting: Dermatology

## 2022-05-17 ENCOUNTER — Encounter: Payer: Self-pay | Admitting: Dermatology

## 2022-05-17 VITALS — BP 116/73 | HR 69

## 2022-05-17 DIAGNOSIS — L219 Seborrheic dermatitis, unspecified: Secondary | ICD-10-CM

## 2022-05-17 DIAGNOSIS — L820 Inflamed seborrheic keratosis: Secondary | ICD-10-CM

## 2022-05-17 DIAGNOSIS — L305 Pityriasis alba: Secondary | ICD-10-CM

## 2022-05-17 DIAGNOSIS — L578 Other skin changes due to chronic exposure to nonionizing radiation: Secondary | ICD-10-CM

## 2022-05-17 DIAGNOSIS — W908XXA Exposure to other nonionizing radiation, initial encounter: Secondary | ICD-10-CM

## 2022-05-17 DIAGNOSIS — L570 Actinic keratosis: Secondary | ICD-10-CM

## 2022-05-17 DIAGNOSIS — X32XXXA Exposure to sunlight, initial encounter: Secondary | ICD-10-CM

## 2022-05-17 NOTE — Patient Instructions (Addendum)
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 Alternate between ketoconazole shampoo   Continue Ketoconazole 2 % shampoo - apply topically as directed. Wash scalp 2 - 3 times weekly. Let sit 5 minutes and rinse out.   Continue Fluocinolone Acetonide body 0.01 % oil - apply topically to crown of scalp 2x/wk as needed, cover with shower cap nightly before bed. Wash out in AM.   Only use when itchy Continue Clobetasol 0.05 % external solution - apply topically to scalp in morning after shampoo qd prn for itchy scalp. Avoid face.  Actinic keratoses are precancerous spots that appear secondary to cumulative UV radiation exposure/sun exposure over time. They are chronic with expected duration over 1 year. A portion of actinic keratoses will progress to squamous cell carcinoma of the skin. It is not possible to reliably predict which spots will progress to skin cancer and so treatment is recommended to prevent development of skin cancer.  Recommend daily broad spectrum sunscreen SPF 30+ to sun-exposed areas, reapply every 2 hours as needed.  Recommend staying in the shade or wearing long sleeves, sun glasses (UVA+UVB protection) and wide brim hats (4-inch brim around the entire circumference of the hat). Call for new or changing lesions.    Cryotherapy Aftercare  Wash gently with soap and water everyday.   Apply Vaseline and Band-Aid daily until healed.     Due to recent changes in healthcare laws, you may see results of your pathology and/or laboratory studies on MyChart before the doctors have had a chance to review them. We understand that in some cases there may be results that are confusing or concerning to you. Please understand that not all results are received at the same time and often the doctors may need to interpret multiple results in order to provide you with the best plan of care or course of  treatment. Therefore, we ask that you please give [place] 2 business days to thoroughly review all your results before contacting the office for clarification. Should we see a critical lab result, you will be contacted sooner.   If You Need Anything After Your Visit  If you have any questions or concerns for your doctor, please call our main line at [phone] and press option 4 to reach your doctor's medical assistant. If no one answers, please leave a voicemail as directed and we will return your call as soon as possible. Messages left after 4 pm will be answered the following business day.   You may also send [place] a message via MyChart. We typically respond to MyChart messages within 1-2 business days.  For prescription refills, please ask your pharmacy to contact our office. Our fax number is [phone].  If you have an urgent issue when the clinic is closed that cannot wait until the next business day, you can page your doctor at the number below.    Please note that while we do our best to be available for urgent issues outside of office hours, we are not available 24/7.   If you have an urgent issue and are unable to reach [place], you may choose to seek medical care at your doctor's office, retail clinic, urgent care center, or emergency room.  If you have a medical emergency, please immediately call 911 or go to the emergency department.  Pager Numbers  - Dr. Gwen Pounds: [phone]  - Dr. Neale Burly: [phone]  -  Dr. Nicole Kindred: [phone]  In the event of inclement weather, please call our main line at [phone] for an update on the status of any delays or closures.  Dermatology Medication Tips: Please keep the boxes that topical medications come in in order to help keep track of the instructions about where and how to use these. Pharmacies typically print the medication instructions only on the boxes and not directly on the medication tubes.   If your medication is too expensive,  please contact our office at [phone] option 4 or send [place] a message through [place].   We are unable to tell what your co-pay for medications will be in advance as this is different depending on your insurance coverage. However, we may be able to find a substitute medication at lower cost or fill out paperwork to get insurance to cover a needed medication.   If a prior authorization is required to get your medication covered by your insurance company, please allow [place] 1-2 business days to complete this process.  Drug prices often vary depending on where the prescription is filled and some pharmacies may offer cheaper prices.  The website www.goodrx.com contains coupons for medications through different pharmacies. The prices here do not account for what the cost may be with help from insurance (it may be cheaper with your insurance), but the website can give you the price if you did not use any insurance.  - You can print the associated coupon and take it with your prescription to the pharmacy.  - You may also stop by our office during regular business hours and pick up a GoodRx coupon card.  - If you need your prescription sent electronically to a different pharmacy, notify our office through [facility] or by phone at [phone] option 4.     Si Usted Necesita Algo Despus de Su Visita  Tambin puede enviarnos un mensaje a travs de Pharmacist, community. Por lo general respondemos a los mensajes de MyChart en el transcurso de 1 a 2 das hbiles.  Para renovar recetas, por favor pida a su farmacia que se ponga en contacto con nuestra oficina. Harland Dingwall de fax es [place] [phone].  Si tiene un asunto urgente cuando la clnica est cerrada y que no puede esperar hasta el siguiente da hbil, puede llamar/localizar a su doctor(a) al nmero que aparece a continuacin.   Por favor, tenga en cuenta que aunque hacemos todo lo posible para estar disponibles para asuntos urgentes fuera del  horario de [place], no estamos disponibles las 24 horas del da, los 7 das de la [place].   Si tiene un problema urgente y no puede comunicarse con nosotros, puede optar por buscar atencin mdica  en el consultorio de su doctor(a), en una clnica privada, en un centro de atencin urgente o en una sala de emergencias.  Si tiene Engineering geologist, por favor llame inmediatamente al 911 o vaya a la sala de emergencias.  Nmeros de bper  - Dr. Nehemiah Massed: [phone]  - Dra. Moye: [phone]  - Dra. Nicole Kindred: [phone]  En caso de inclemencias del [place], por favor llame a Johnsie Kindred principal al [phone] para una actualizacin sobre el [place] de cualquier retraso o cierre.  Consejos para la medicacin en dermatologa: Por favor, guarde las cajas en las que vienen los medicamentos de uso tpico para ayudarle a seguir las instrucciones sobre dnde y cmo usarlos. Las farmacias generalmente imprimen las instrucciones del medicamento slo en las cajas y no directamente en los tubos  del medicamento.   Si su medicamento es muy caro, por favor, pngase en contacto con Zigmund Daniel llamando al [phone] y presione la opcin 4 o envenos un mensaje a travs de Pharmacist, community.   No podemos decirle cul ser su copago por los medicamentos por adelantado ya que esto es diferente dependiendo de la cobertura de su seguro. Sin embargo, es posible que podamos encontrar un medicamento sustituto a Electrical engineer un formulario para que el seguro cubra el medicamento que se considera necesario.   Si se requiere una autorizacin previa para que su compaa de seguros [place] su medicamento, por favor permtanos de 1 a 2 das hbiles para completar este proceso.  Los precios de los medicamentos varan con frecuencia dependiendo del Environmental consultant de dnde se surte la receta y alguna farmacias pueden ofrecer precios ms baratos.  El sitio web www.goodrx.com tiene cupones para medicamentos de Office manager. Los precios aqu no tienen en cuenta lo que podra costar con la ayuda del seguro (puede ser ms barato con su seguro), pero el sitio web puede darle el precio si no utiliz Research scientist (physical sciences).  - Puede imprimir el cupn correspondiente y llevarlo con su receta a la farmacia.  - Tambin puede pasar por nuestra oficina durante el horario de atencin regular y Charity fundraiser una tarjeta de cupones de GoodRx.  - Si necesita que su receta se enve electrnicamente a una farmacia diferente, informe a nuestra oficina a travs de MyChart de [place] o por telfono llamando al [phone] y presione la opcin 4.

## 2022-05-17 NOTE — Progress Notes (Signed)
Follow-Up Visit   Subjective  Sheryl Kennedy is a 77 y.o. female who presents for the following: aks follow up at scalp. Hx of aks at scalp and used 4f/u solution to areas. Reports feels it has improved.  Spot at left arm she would like check- irritated.   The following portions of the chart were reviewed this encounter and updated as appropriate: medications, allergies, medical history  Review of Systems:  No other skin or systemic complaints except as noted in HPI or Assessment and Plan.  Objective  Well appearing patient in no apparent distress; mood and affect are within normal limits.   A focused examination was performed of the following areas: Left arm, scalp  Relevant exam findings are noted in the Assessment and Plan.  crown of scalp x 6 (6) Erythematous thin papules/macules with gritty scale.   left posterior upper arm x 1 Erythematous stuck-on, waxy papule or plaque    Assessment & Plan   Seborrheic dermatitis Scalp Exam:  Erythema with focal adherent crusts/matted hair  With Pityriasis Amiantacea -Chronic and persistent condition with duration or expected duration over one year. Condition is symptomatic/ bothersome to patient. Improving but not currently at goal.   Seborrheic Dermatitis  -  is a chronic persistent rash characterized by pinkness and scaling most commonly of the mid face but also can occur on the scalp (dandruff), ears; mid chest, mid back and groin.  It tends to be exacerbated by stress and cooler weather.  People who have neurologic disease may experience new onset or exacerbation of existing seborrheic dermatitis.  The condition is not curable but treatable and can be controlled.   Recommend starting  T- Sal shampoo (sample given) - massage into scalp and let sit for 5 minutes before rinsing.   Alternate using between ketoconazole shampoo   Continue Ketoconazole 2 % shampoo - apply topically as directed. Wash scalp 2 - 3 times weekly. Let  sit 5 minutes and rinse out.   Continue Fluocinolone Acetonide body 0.01 % oil - apply topically to crown of scalp 2x/wk prn, cover with shower cap nightly before bed. Wash out in AM  Used only when flared/itchy Continue Clobetasol 0.05 % external solution - apply topically to scalp in morning after shampoo qd prn for itchy scalp. Avoid face.    Topical steroids (such as triamcinolone, fluocinolone, fluocinonide, mometasone, clobetasol, halobetasol, betamethasone, hydrocortisone) can cause thinning and lightening of the skin if they are used for too long in the same area. Your physician has selected the right strength medicine for your problem and area affected on the body. Please use your medication only as directed by your physician to prevent side effects.     Actinic keratosis (6) crown of scalp x 6  Actinic keratoses are precancerous spots that appear secondary to cumulative UV radiation exposure/sun exposure over time. They are chronic with expected duration over 1 year. A portion of actinic keratoses will progress to squamous cell carcinoma of the skin. It is not possible to reliably predict which spots will progress to skin cancer and so treatment is recommended to prevent development of skin cancer.  Recommend daily broad spectrum sunscreen SPF 30+ to sun-exposed areas, reapply every 2 hours as needed.  Recommend staying in the shade or wearing long sleeves, sun glasses (UVA+UVB protection) and wide brim hats (4-inch brim around the entire circumference of the hat). Call for new or changing lesions.  Pt completed course of 5FU/Calcip to scalp for 2 wks in  past with some improvement.  Destruction of lesion - crown of scalp x 6  Destruction method: cryotherapy   Informed consent: discussed and consent obtained   Lesion destroyed using liquid nitrogen: Yes   Region frozen until ice ball extended beyond lesion: Yes   Outcome: patient tolerated procedure well with no complications    Post-procedure details: wound care instructions given   Additional details:  Prior to procedure, discussed risks of blister formation, small wound, skin dyspigmentation, or rare scar following cryotherapy. Recommend Vaseline ointment to treated areas while healing.   Inflamed seborrheic keratosis left posterior upper arm x 1  Symptomatic, irritating, patient would like treated.  Destruction of lesion - left posterior upper arm x 1  Destruction method: cryotherapy   Informed consent: discussed and consent obtained   Lesion destroyed using liquid nitrogen: Yes   Region frozen until ice ball extended beyond lesion: Yes   Outcome: patient tolerated procedure well with no complications   Post-procedure details: wound care instructions given   Additional details:  Prior to procedure, discussed risks of blister formation, small wound, skin dyspigmentation, or rare scar following cryotherapy. Recommend Vaseline ointment to treated areas while healing.   Related Medications mupirocin ointment (BACTROBAN) 2 % Apply to affected areas qd/bid prn.  ACTINIC DAMAGE - chronic, secondary to cumulative UV radiation exposure/sun exposure over time - diffuse scaly erythematous macules with underlying dyspigmentation - Recommend daily broad spectrum sunscreen SPF 30+ to sun-exposed areas, reapply every 2 hours as needed.  - Recommend staying in the shade or wearing long sleeves, sun glasses (UVA+UVB protection) and wide brim hats (4-inch brim around the entire circumference of the hat). - Call for new or changing lesions.   Return for 2 - 3 month aks and seb derm followup.  I, Asher Muir, CMA, am acting as scribe for Willeen Niece, MD.   Documentation: I have reviewed the above documentation for accuracy and completeness, and I agree with the above.  Willeen Niece, MD

## 2022-05-20 NOTE — Discharge Instructions (Signed)

## 2022-05-23 ENCOUNTER — Ambulatory Visit: Payer: Medicare HMO | Admitting: Anesthesiology

## 2022-05-23 ENCOUNTER — Other Ambulatory Visit: Payer: Self-pay

## 2022-05-23 ENCOUNTER — Encounter
Admission: RE | Disposition: A | Discharge status: Home / Self Care | Payer: Self-pay | Source: Home / Self Care | Attending: Ophthalmology

## 2022-05-23 ENCOUNTER — Encounter: Payer: Self-pay | Admitting: Ophthalmology

## 2022-05-23 ENCOUNTER — Ambulatory Visit
Admission: RE | Admit: 2022-05-23 | Discharge: 2022-05-23 | Disposition: A | Discharge status: Home / Self Care | Payer: Medicare HMO | Attending: Ophthalmology | Admitting: Ophthalmology

## 2022-05-23 DIAGNOSIS — M109 Gout, unspecified: Secondary | ICD-10-CM | POA: Insufficient documentation

## 2022-05-23 DIAGNOSIS — Z79899 Other long term (current) drug therapy: Secondary | ICD-10-CM | POA: Diagnosis not present

## 2022-05-23 DIAGNOSIS — I10 Essential (primary) hypertension: Secondary | ICD-10-CM | POA: Insufficient documentation

## 2022-05-23 DIAGNOSIS — Z85828 Personal history of other malignant neoplasm of skin: Secondary | ICD-10-CM | POA: Diagnosis not present

## 2022-05-23 DIAGNOSIS — M1990 Unspecified osteoarthritis, unspecified site: Secondary | ICD-10-CM | POA: Insufficient documentation

## 2022-05-23 DIAGNOSIS — H2511 Age-related nuclear cataract, right eye: Secondary | ICD-10-CM | POA: Diagnosis present

## 2022-05-23 DIAGNOSIS — Z79890 Hormone replacement therapy (postmenopausal): Secondary | ICD-10-CM | POA: Insufficient documentation

## 2022-05-23 DIAGNOSIS — E785 Hyperlipidemia, unspecified: Secondary | ICD-10-CM | POA: Insufficient documentation

## 2022-05-23 HISTORY — PX: CATARACT EXTRACTION W/PHACO: SHX586

## 2022-05-23 SURGERY — PHACOEMULSIFICATION, CATARACT, WITH IOL INSERTION
Anesthesia: Monitor Anesthesia Care | Laterality: Right

## 2022-05-23 MED ORDER — LACTATED RINGERS IV SOLN: INTRAVENOUS | Status: DC

## 2022-05-23 MED ORDER — ACETAMINOPHEN 325 MG PO TABS
650.0000 mg | ORAL_TABLET | Freq: Once | ORAL | Status: AC
  Administered 2022-05-23: 650 mg via ORAL

## 2022-05-23 MED ORDER — SIGHTPATH DOSE#1 BSS IO SOLN
INTRAOCULAR | Status: DC | PRN
  Administered 2022-05-23: 87 mL via OPHTHALMIC

## 2022-05-23 MED ORDER — ARMC OPHTHALMIC DILATING DROPS
1.0000 | OPHTHALMIC | Status: DC | PRN
  Administered 2022-05-23 (×3): 1 via OPHTHALMIC

## 2022-05-23 MED ORDER — MIDAZOLAM HCL 2 MG/2ML IJ SOLN
INTRAMUSCULAR | Status: DC | PRN
  Administered 2022-05-23: 2 mg via INTRAVENOUS

## 2022-05-23 MED ORDER — MOXIFLOXACIN HCL 0.5 % OP SOLN
OPHTHALMIC | Status: DC | PRN
  Administered 2022-05-23: .2 mL via OPHTHALMIC

## 2022-05-23 MED ORDER — FENTANYL CITRATE (PF) 100 MCG/2ML IJ SOLN
INTRAMUSCULAR | Status: DC | PRN
  Administered 2022-05-23 (×2): 50 ug via INTRAVENOUS

## 2022-05-23 MED ORDER — TETRACAINE HCL 0.5 % OP SOLN
1.0000 [drp] | OPHTHALMIC | Status: DC | PRN
  Administered 2022-05-23 (×3): 1 [drp] via OPHTHALMIC

## 2022-05-23 MED ORDER — LIDOCAINE HCL (PF) 2 % IJ SOLN
INTRAOCULAR | Status: DC | PRN
  Administered 2022-05-23: 4 mL via INTRAOCULAR

## 2022-05-23 MED ORDER — SIGHTPATH DOSE#1 BSS IO SOLN
INTRAOCULAR | Status: DC | PRN
  Administered 2022-05-23: 15 mL via INTRAOCULAR

## 2022-05-23 MED ORDER — SIGHTPATH DOSE#1 NA HYALUR & NA CHOND-NA HYALUR IO KIT
PACK | INTRAOCULAR | Status: DC | PRN
  Administered 2022-05-23: 1 via OPHTHALMIC

## 2022-05-23 SURGICAL SUPPLY — 12 items
CATARACT SUITE SIGHTPATH (MISCELLANEOUS) ×1 IMPLANT
DISSECTOR HYDRO NUCLEUS 50X22 (MISCELLANEOUS) ×1 IMPLANT
FEE CATARACT SUITE SIGHTPATH (MISCELLANEOUS) ×1 IMPLANT
GLOVE SURG GAMMEX PI TX LF 7.5 (GLOVE) ×1 IMPLANT
GLOVE SURG SYN 8.5  E (GLOVE) ×1
GLOVE SURG SYN 8.5 E (GLOVE) ×1 IMPLANT
GLOVE SURG SYN 8.5 PF PI (GLOVE) ×1 IMPLANT
LENS IOL TECNIS EYHANCE 18.5 (Intraocular Lens) IMPLANT
NDL FILTER BLUNT 18X1 1/2 (NEEDLE) ×1 IMPLANT
NEEDLE FILTER BLUNT 18X1 1/2 (NEEDLE) ×1 IMPLANT
SYR 3ML LL SCALE MARK (SYRINGE) ×1 IMPLANT
SYR 5ML LL (SYRINGE) ×1 IMPLANT

## 2022-05-23 NOTE — Op Note (Signed)
OPERATIVE NOTE  EMIE POTEAT [id number] 05/23/2022   PREOPERATIVE DIAGNOSIS:  Nuclear sclerotic cataract right eye.  H25.11   POSTOPERATIVE DIAGNOSIS:    Nuclear sclerotic cataract right eye.     PROCEDURE:  Phacoemusification with posterior chamber intraocular lens placement of the right eye   LENS:   Implant Name Type Inv. Item Serial No. Manufacturer Lot No. LRB No. Used Action  LENS IOL TECNIS EYHANCE 18.5 - [id number] Intraocular Lens LENS IOL TECNIS EYHANCE 18.5 [id number] [employer]  Right 1 Implanted       Procedure(s): CATARACT EXTRACTION PHACO AND INTRAOCULAR LENS PLACEMENT (IOC) RIGHT 3.84 00:28.1 (Right)  DIB00 +18.5   ULTRASOUND TIME: 0 minutes 28 seconds.  CDE 3.84   SURGEON:  Willey Blade, MD, MPH  ANESTHESIOLOGIST: Anesthesiologist: Marisue Humble, MD CRNA: [place], [place], CRNA; Omer Jack, CRNA   ANESTHESIA:  Topical with tetracaine drops augmented with 1% preservative-free intracameral lidocaine.  ESTIMATED BLOOD LOSS: less than 1 mL.   COMPLICATIONS:  None.   DESCRIPTION OF PROCEDURE:  The patient was identified in the holding room and transported to the operating room and placed in the supine position under the operating microscope.  The right eye was identified as the operative eye and it was prepped and draped in the usual sterile ophthalmic fashion.   A 1.0 millimeter clear-corneal paracentesis was made at the 10:30 position. 0.5 ml of preservative-free 1% lidocaine with epinephrine was injected into the anterior chamber.  The anterior chamber was filled with viscoelastic.  A 2.4 millimeter keratome was used to make a near-clear corneal incision at the 8:00 position.  A curvilinear capsulorrhexis was made with a cystotome and capsulorrhexis forceps.  Balanced salt solution was used to hydrodissect and hydrodelineate the nucleus.   Phacoemulsification was then used in stop and chop fashion to remove the lens nucleus and epinucleus.  The remaining  cortex was then removed using the irrigation and aspiration handpiece. Viscoelastic was then placed into the capsular bag to distend it for lens placement.  A lens was then injected into the capsular bag.  The remaining viscoelastic was aspirated.   Wounds were hydrated with balanced salt solution.  The anterior chamber was inflated to a physiologic pressure with balanced salt solution.   Intracameral vigamox 0.1 mL undiluted was injected into the eye and a drop placed onto the ocular surface.  No wound leaks were noted.  The patient was taken to the recovery room in stable condition without complications of anesthesia or surgery  Willey Blade 05/23/2022, 12:04 PM

## 2022-05-23 NOTE — Anesthesia Preprocedure Evaluation (Signed)
Anesthesia Evaluation  Patient identified by MRN, date of birth, ID band Patient awake    Reviewed: Allergy & Precautions, H&P , NPO status , Patient's Chart, lab work & pertinent test results  Airway Mallampati: II  TM Distance: >3 FB Neck ROM: Full    Dental no notable dental hx.    Pulmonary neg pulmonary ROS   Pulmonary exam normal breath sounds clear to auscultation       Cardiovascular hypertension, negative cardio ROS Normal cardiovascular exam Rhythm:Regular Rate:Normal     Neuro/Psych negative neurological ROS  negative psych ROS   GI/Hepatic negative GI ROS, Neg liver ROS,,,  Endo/Other  negative endocrine ROS    Renal/GU Renal diseasenegative Renal ROS  negative genitourinary   Musculoskeletal negative musculoskeletal ROS (+) Arthritis ,    Abdominal   Peds negative pediatric ROS (+)  Hematology negative hematology ROS (+)   Anesthesia Other Findings   Reproductive/Obstetrics negative OB ROS                              Anesthesia Physical Anesthesia Plan  ASA: 3  Anesthesia Plan: MAC   Post-op Pain Management:    Induction: Intravenous  PONV Risk Score and Plan:   Airway Management Planned: Natural Airway and Nasal Cannula  Additional Equipment:   Intra-op Plan:   Post-operative Plan:   Informed Consent: I have reviewed the patients History and Physical, chart, labs and discussed the procedure including the risks, benefits and alternatives for the proposed anesthesia with the patient or authorized representative who has indicated his/her understanding and acceptance.     Dental Advisory Given  Plan Discussed with: Anesthesiologist, CRNA and Surgeon  Anesthesia Plan Comments: (Patient consented for risks of anesthesia including but not limited to:  - adverse reactions to medications - damage to eyes, teeth, lips or other oral mucosa - nerve damage due to  positioning  - sore throat or hoarseness - Damage to heart, brain, nerves, lungs, other parts of body or loss of life  Patient voiced understanding.)         Anesthesia Quick Evaluation

## 2022-05-23 NOTE — H&P (Signed)
[facility]   Primary Care Physician:  Marguarite Arbour, MD Ophthalmologist: Dr. Willey Blade  Pre-Procedure History & Physical: HPI:  Sheryl Kennedy is a 77 y.o. female here for cataract surgery.   Past Medical History:  Diagnosis Date   Arthritis    Cancer (HCC) 08/2012   skin right anterior lower leg, squamous cell   DSAP (disseminated superficial actinic porokeratosis)    Gout    Hx of seasonal allergies    Hyperlipidemia    Hypertension    Squamous cell carcinoma of skin    R anterior lower leg   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 complications     Past Surgical History:  Procedure Laterality Date   BACK SURGERY  2008   no metal in back   BREAST BIOPSY Right 09/13/2010   core/neg   BREAST BIOPSY Right 12/09/2015   US biopsy/neg   BREAST SURGERY Right 2012   CATARACT EXTRACTION W/PHACO Left 04/25/2022   Procedure: CATARACT EXTRACTION PHACO AND INTRAOCULAR LENS PLACEMENT (IOC) LEFT 3.42 00:25.2;  Surgeon: Nevada Crane, MD;  Location: [facility];  Service: Ophthalmology;  Laterality: Left;   CHONDROPLASTY Left 01/22/2016   Procedure: arthroscopic medial AND LATERAL CHONDROPLASTY;  Surgeon: Donato Heinz, MD;  Location: [facility] ORS;  Service: Orthopedics;  Laterality: Left;   COLONOSCOPY  2011   COLONOSCOPY WITH PROPOFOL N/A 04/14/2015   Procedure: COLONOSCOPY WITH PROPOFOL;  Surgeon: Christena Deem, MD;  Location: [facility];  Service: Endoscopy;  Laterality: N/A;   CYST REMOVAL HAND Left 2012   JOINT REPLACEMENT Right 09/30/2013   TOTAL KNEE REPLACEMENT, DR. Ernest Pine, [facility]   KNEE ARTHROSCOPY WITH MEDIAL MENISECTOMY Left 01/22/2016   Procedure: KNEE ARTHROSCOPY WITH MEDIAL AND LATERAL MENISECTOMY;  Surgeon: Donato Heinz, MD;  Location: [facility] OR[facility];  Service: Orthopedics;  Laterality: Left;   KNEE SURGERY  1997   LEFT HEART CATH AND CORONARY ANGIOGRAPHY N/A 10/25/2021   Procedure: LEFT HEART CATH AND CORONARY ANGIOGRAPHY;  Surgeon: Yvonne Kendall, MD;  Location: [facility] INVASIVE CV LAB;  Service: Cardiovascular;  Laterality: N/A;   Stab pheblectomy  Left 2013   TONSILLECTOMY     VEIN CLOSURE Bilateral 2012    Prior to Admission medications   Medication Sig Start Date End Date Taking? Authorizing Provider  allopurinol (ZYLOPRIM) 300 MG tablet Take 300 mg by mouth daily.   Yes [provider]  aspirin 325 MG tablet Take 325 mg by mouth daily.   Yes [provider]  Calcium Carbonate-Vit D-Min (CALCIUM 600+D PLUS MINERALS PO) Take 1 tablet by mouth daily.   Yes [provider]  Cholecalciferol (VITAMIN D3) 25 MCG (1000 UT) CAPS Take by mouth.   Yes [provider]  Cyanocobalamin 1000 MCG TBCR Take 1 tablet by mouth daily.    Yes [provider]  docusate sodium (COLACE) 100 MG capsule Take 100 mg by mouth at bedtime.   Yes [provider]  fexofenadine (ALLEGRA) 180 MG tablet Take 180 mg by mouth daily as needed for allergies.   Yes [provider]  fluticasone (FLONASE) 50 MCG/ACT nasal spray Place 1 spray into both nostrils daily as needed for allergies or rhinitis.   Yes [provider]  levothyroxine (SYNTHROID) 50 MCG tablet Take 50 mcg by mouth daily before breakfast.   Yes [provider]  metoprolol succinate (TOPROL-XL) 25 MG 24 hr tablet Take 25 mg by mouth daily.  04/26/13  Yes [provider]  Nutritional Supplements (ESTROVEN PM PO) Take 0.5 tablets by mouth at bedtime. For hotflashes   Yes [provider]  potassium chloride SA (K-DUR,KLOR-CON) 20 MEQ tablet Take 20 mEq by mouth 3 (three) times daily.  03/28/13  Yes [provider]  simvastatin (ZOCOR) 40 MG tablet Take 20 mg by mouth daily at 6 PM.  04/08/13  Yes [provider]  spironolactone-hydrochlorothiazide (ALDACTAZIDE) 25-25 MG tablet Take 1 tablet by mouth daily.   Yes [provider]  torsemide (DEMADEX) 20 MG tablet Take 20 mg by  mouth daily.  04/26/13  Yes [provider]  traMADol (ULTRAM) 50 MG tablet Take 50 mg by mouth 4 (four) times daily.   Yes [provider]  triamcinolone cream (KENALOG) 0.1 % Apply to affected areas rash on body once to twice daily until improved. Avoid face, groin, axilla. 04/13/22  Yes Willeen Niece, MD  Calcipotriene 0.005 % solution Apply twice daily to scalp for 2 weeks. Apply 2nd 03/15/22   Willeen Niece, MD  clobetasol (TEMOVATE) 0.05 % external solution APPLY TOPICALLY TO SCALP IN THE MORNING AFTER SHAMPOO FOR ITCHY SCALP 01/05/22   Willeen Niece, MD  colchicine 0.6 MG tablet Take 0.5 tablets (0.3 mg total) by mouth daily for 7 days. 10/27/21 11/03/21  Marrion Coy, MD  Fluocinolone Acetonide Body 0.01 % OIL Apply topically to itchy scaly spot at scalp cover with shower cap qhs 09/14/21   Willeen Niece, MD  Fluorouracil 5 % SOLN Apply twice daily to affected areas on scalp for 2 weeks. Apply first 03/15/22   Willeen Niece, MD  ketoconazole (NIZORAL) 2 % shampoo Apply 1 Application topically as directed. Wash scalp 2-3 times weekly, Let sit 5 minutes and rinse out 09/14/21   Willeen Niece, MD  mupirocin ointment Idelle Jo) 2 % Apply to affected areas qd/bid prn. 06/07/21   Willeen Niece, MD    Allergies as of 04/12/2022 - Review Complete 03/15/2022  Allergen Reaction Noted   Naproxen Swelling 06/12/2014   Pseudoephedrine Rash and Other (See Comments) 05/02/2013   Shellfish allergy Other (See Comments) 09/17/2012   Sulfa antibiotics Rash 06/01/2012    Family History  Problem Relation Age of Onset   Heart failure Mother    Heart disease Mother    Arthritis Mother    Colon cancer Paternal Grandmother    Breast cancer Neg Hx     Social History   Socioeconomic History   Marital status: Married    Spouse name: Not on file   Number of children: Not on file   Years of education: Not on file   Highest education level: Not on file  Occupational History   Not on file   Tobacco Use   Smoking status: Never   Smokeless tobacco: Never  Substance and Sexual Activity   Alcohol use: Yes    Comment: occassional   Drug use: No   Sexual activity: Not Currently  Other Topics Concern   Not on file  Social History Narrative   Not on file   Social Determinants of Health   Financial Resource Strain: Not on file  Food Insecurity: No Food Insecurity (10/25/2021)   Hunger Vital Sign    Worried About Running Out of Food in the Last Year: Never true    Ran Out of Food in the Last Year: Never true  Transportation Needs: No Transportation Needs (10/25/2021)   PRAPARE - Administrator, Civil Service (Medical): No    Lack of Transportation (Non-Medical): No  Physical Activity: Not on file  Stress: Not on file  Social Connections: Not on file  Intimate Partner Violence: Not At Risk (10/25/2021)   Humiliation, Afraid, Rape, and Kick questionnaire    Fear of Current or Ex-Partner: No    Emotionally Abused: No    Physically Abused: No    Sexually Abused: No    Review of Systems: See HPI, otherwise negative ROS  Physical Exam: BP 116/74   Temp (!) 97.3 F (36.3 C) (Temporal)   Ht 5' 0.98" (1.549 m)   Wt 61.5 kg   SpO2 97%   BMI 25.63 kg/m  General:   Alert, cooperative in NAD Head:  Normocephalic and atraumatic. Respiratory:  Normal work of breathing. Cardiovascular:  RRR  Impression/Plan: Sheryl Kennedy is here for cataract surgery.  Risks, benefits, limitations, and alternatives regarding cataract surgery have been reviewed with the patient.  Questions have been answered.  All parties agreeable.   Willey Blade, MD  05/23/2022, 11:40 AM'

## 2022-05-23 NOTE — Transfer of Care (Signed)
Immediate Anesthesia Transfer of Care Note  Patient: Sheryl Kennedy  Procedure(s) Performed: CATARACT EXTRACTION PHACO AND INTRAOCULAR LENS PLACEMENT (IOC) RIGHT 3.84 00:28.1 (Right)  Patient Location: PACU  Anesthesia Type: MAC  Level of Consciousness: awake, alert  and patient cooperative  Airway and Oxygen Therapy: Patient Spontanous Breathing and Patient connected to supplemental oxygen  Post-op Assessment: Post-op Vital signs reviewed, Patient's Cardiovascular Status Stable, Respiratory Function Stable, Patent Airway and No signs of Nausea or vomiting  Post-op Vital Signs: Reviewed and stable  Complications: No notable events documented.

## 2022-05-23 NOTE — Anesthesia Postprocedure Evaluation (Signed)
Anesthesia Post Note  Patient: Sheryl Kennedy  Procedure(s) Performed: CATARACT EXTRACTION PHACO AND INTRAOCULAR LENS PLACEMENT (IOC) RIGHT 3.84 00:28.1 (Right)  Patient location during evaluation: PACU Anesthesia Type: MAC Level of consciousness: awake and alert Pain management: pain level controlled Vital Signs Assessment: post-procedure vital signs reviewed and stable Respiratory status: spontaneous breathing, nonlabored ventilation, respiratory function stable and patient connected to nasal cannula oxygen Cardiovascular status: stable and blood pressure returned to baseline Postop Assessment: no apparent nausea or vomiting Anesthetic complications: no   No notable events documented.   Last Vitals:  Vitals:   05/23/22 1204 05/23/22 1213  BP: 104/65 108/65  Pulse: 77 75  Resp: 14 12  Temp: (!) 36.2 C (!) 36.2 C  SpO2: 96% 95%    Last Pain:  Vitals:   05/23/22 1056  TempSrc: Temporal  PainSc: 0-No pain                 Braylin Xu C Cote Mayabb

## 2022-05-24 ENCOUNTER — Encounter: Payer: Self-pay | Admitting: Ophthalmology

## 2022-07-13 ENCOUNTER — Other Ambulatory Visit: Payer: Self-pay | Admitting: Internal Medicine

## 2022-07-13 DIAGNOSIS — R413 Other amnesia: Secondary | ICD-10-CM

## 2022-07-13 DIAGNOSIS — R519 Headache, unspecified: Secondary | ICD-10-CM

## 2022-07-18 ENCOUNTER — Encounter: Payer: Self-pay | Admitting: Internal Medicine

## 2022-08-01 ENCOUNTER — Ambulatory Visit
Admission: RE | Admit: 2022-08-01 | Discharge: 2022-08-01 | Disposition: A | Discharge status: Home / Self Care | Payer: Medicare HMO | Source: Ambulatory Visit | Attending: Internal Medicine | Admitting: Internal Medicine

## 2022-08-01 ENCOUNTER — Other Ambulatory Visit: Payer: Self-pay | Admitting: Dermatology

## 2022-08-01 DIAGNOSIS — R413 Other amnesia: Secondary | ICD-10-CM

## 2022-08-01 DIAGNOSIS — R519 Headache, unspecified: Secondary | ICD-10-CM

## 2022-08-01 DIAGNOSIS — L219 Seborrheic dermatitis, unspecified: Secondary | ICD-10-CM

## 2022-09-19 ENCOUNTER — Encounter: Payer: Self-pay | Admitting: Dermatology

## 2022-09-19 ENCOUNTER — Ambulatory Visit: Payer: Medicare HMO | Admitting: Dermatology

## 2022-09-19 DIAGNOSIS — L570 Actinic keratosis: Secondary | ICD-10-CM | POA: Diagnosis not present

## 2022-09-19 DIAGNOSIS — L565 Disseminated superficial actinic porokeratosis (DSAP): Secondary | ICD-10-CM

## 2022-09-19 DIAGNOSIS — W908XXA Exposure to other nonionizing radiation, initial encounter: Secondary | ICD-10-CM

## 2022-09-19 DIAGNOSIS — L305 Pityriasis alba: Secondary | ICD-10-CM

## 2022-09-19 DIAGNOSIS — L219 Seborrheic dermatitis, unspecified: Secondary | ICD-10-CM

## 2022-09-19 NOTE — Addendum Note (Signed)
Addended by: Elie Goody on: 09/19/2022 03:57 PM   Modules accepted: Level of Service

## 2022-09-19 NOTE — Patient Instructions (Signed)
Due to recent changes in healthcare laws, you may see results of your pathology and/or laboratory studies on MyChart before the doctors have had a chance to review them. We understand that in some cases there may be results that are confusing or concerning to you. Please understand that not all results are received at the same time and often the doctors may need to interpret multiple results in order to provide you with the best plan of care or course of treatment. Therefore, we ask that you please give [place] 2 business days to thoroughly review all your results before contacting the office for clarification. Should we see a critical lab result, you will be contacted sooner.   If You Need Anything After Your Visit  If you have any questions or concerns for your doctor, please call our main line at [phone] and press option 4 to reach your doctor's medical assistant. If no one answers, please leave a voicemail as directed and we will return your call as soon as possible. Messages left after 4 pm will be answered the following business day.   You may also send [place] a message via MyChart. We typically respond to MyChart messages within 1-2 business days.  For prescription refills, please ask your pharmacy to contact our office. Our fax number is [phone].  If you have an urgent issue when the clinic is closed that cannot wait until the next business day, you can page your doctor at the number below.    Please note that while we do our best to be available for urgent issues outside of office hours, we are not available 24/7.   If you have an urgent issue and are unable to reach [place], you may choose to seek medical care at your doctor's office, retail clinic, urgent care center, or emergency room.  If you have a medical emergency, please immediately call 911 or go to the emergency department.  Pager Numbers  - Dr. Gwen Pounds: [phone]  - Dr. Roseanne Reno: [phone]  - Dr. Katrinka Blazing: [phone]    In the event of inclement weather, please call our main line at [phone] for an update on the status of any delays or closures.  Dermatology Medication Tips: Please keep the boxes that topical medications come in in order to help keep track of the instructions about where and how to use these. Pharmacies typically print the medication instructions only on the boxes and not directly on the medication tubes.   If your medication is too expensive, please contact our office at [phone] option 4 or send [place] a message through MyChart.   We are unable to tell what your co-pay for medications will be in advance as this is different depending on your insurance coverage. However, we may be able to find a substitute medication at lower cost or fill out paperwork to get insurance to cover a needed medication.   If a prior authorization is required to get your medication covered by your insurance company, please allow [place] 1-2 business days to complete this process.  Drug prices often vary depending on where the prescription is filled and some pharmacies may offer cheaper prices.  The website www.goodrx.com contains coupons for medications through different pharmacies. The prices here do not account for what the cost may be with help from insurance (it may be cheaper with your insurance), but the website can give you the price if you did not use any insurance.  - You can print the associated coupon and take it  with your prescription to the pharmacy.  - You may also stop by our office during regular business hours and pick up a GoodRx coupon card.  - If you need your prescription sent electronically to a different pharmacy, notify our office through [facility] or by phone at [phone] option 4.     Si Usted Necesita Algo Despus de Su Visita  Tambin puede enviarnos un mensaje a travs de Clinical cytogeneticist. Por lo general respondemos a los mensajes de MyChart en el transcurso de 1 a 2 das  hbiles.  Para renovar recetas, por favor pida a su farmacia que se ponga en contacto con nuestra oficina. Annie Sable de fax es [place] [phone].  Si tiene un asunto urgente cuando la clnica est cerrada y que no puede esperar hasta el siguiente da hbil, puede llamar/localizar a su doctor(a) al nmero que aparece a continuacin.   Por favor, tenga en cuenta que aunque hacemos todo lo posible para estar disponibles para asuntos urgentes fuera del horario de [place], no estamos disponibles las 24 horas del da, los 7 [address]  [address] de la [place].   Si tiene un problema urgente y no puede comunicarse con nosotros, puede optar por buscar atencin mdica  en el consultorio de su doctor(a), en una clnica privada, en un centro de atencin urgente o en una sala de emergencias.  Si tiene Engineer, drilling, por favor llame inmediatamente al 911 o vaya a la sala de emergencias.  Nmeros de bper  - Dr. Gwen Pounds: [phone]  - Dra. Roseanne Reno: [phone]  - Dr. Katrinka Blazing: [phone]   En caso de inclemencias del tiempo, por favor llame a Lacy Duverney principal al [phone] para una actualizacin sobre el [place] de cualquier retraso o cierre.  Consejos para la medicacin en dermatologa: Por favor, guarde las cajas en las que vienen los medicamentos de uso tpico para ayudarle a seguir las instrucciones sobre dnde y cmo usarlos. Las farmacias generalmente imprimen las instrucciones del medicamento slo en las cajas y no directamente en los tubos del [place].   Si su medicamento es muy caro, por favor, pngase en contacto con Rolm Gala llamando al [phone] y presione la opcin 4 o envenos un mensaje a travs de Clinical cytogeneticist.   No podemos decirle cul ser su copago por los medicamentos por adelantado ya que esto es diferente dependiendo de la cobertura de su seguro. Sin embargo, es posible que podamos encontrar un medicamento sustituto a Audiological scientist un formulario para que el  seguro cubra el medicamento que se considera necesario.   Si se requiere una autorizacin previa para que su compaa de seguros [place] su medicamento, por favor permtanos de 1 a 2 das hbiles para completar [address]  Los precios de los medicamentos varan con frecuencia dependiendo del Environmental consultant de dnde se surte la receta y alguna farmacias pueden ofrecer precios ms baratos.  El sitio web www.goodrx.com tiene cupones para medicamentos de Health and safety inspector. Los precios aqu no tienen en cuenta lo que podra costar con la ayuda del seguro (puede ser ms barato con su seguro), pero el sitio web puede darle el precio si no utiliz Tourist information centre manager.  - Puede imprimir el cupn correspondiente y llevarlo con su receta a la farmacia.  - Tambin puede pasar por nuestra oficina durante el horario de atencin regular y Education officer, museum una tarjeta de cupones de GoodRx.  - Si necesita que su receta se enve electrnicamente a Psychiatrist, informe a nuestra oficina a travs de MyChart de  [place] o por telfono llamando al [phone] y presione la opcin 4.

## 2022-09-19 NOTE — Progress Notes (Addendum)
Follow-Up Visit   Subjective  Sheryl Kennedy is a 77 y.o. 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 Patient currently only using T-Sal shampoo and ketoconazole, alternating. She has not had to use clobetasol solution or fluocinolone oil.   Patient also used 5FU/calcipotriene to crown of scalp to treat AK's. Patient advises she did get crusty spots that finally fell off.   Patient does have a dark spot at left temple. Also a few spots at left post lower leg.  The patient has spots, moles and lesions to be evaluated, some may be new or changing and the patient may have concern these could be cancer.   The following portions of the chart were reviewed this encounter and updated as appropriate: medications, allergies, medical history  Review of Systems:  No other skin or systemic complaints except as noted in HPI or Assessment and Plan.  Objective  Well appearing patient in no apparent distress; mood and affect are within normal limits.   A focused examination was performed of the following areas: scalp  Relevant exam findings are noted in the Assessment and Plan.  Vertex Scalp x 1 Erythematous thin papules/macules with gritty scale.     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 Well-controlled and at goal.   Seborrheic Dermatitis is a chronic persistent rash characterized by pinkness and scaling most commonly of the mid face but also can occur on the scalp (dandruff), ears; mid chest, mid back and groin.  It tends to be exacerbated by stress and cooler weather.  People who have neurologic disease may experience new onset or exacerbation of existing seborrheic dermatitis.  The condition is not curable but treatable and can be controlled.  Treatment Plan: Continue T- Sal shampoo - massage into scalp and let  sit for 5 minutes before rinsing.    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    Chronic inherited condition of sun-exposed skin, most commonly arms and legs.  Difficult to treat.  Recommend photoprotection and regular use of spf 30 or higher sunscreen to prevent worsening of condition and precancerous changes.   Treatment: Continue Cholesterol/Lovastatin cream BID.  AK (actinic keratosis) Vertex Scalp x 1  Actinic keratoses are precancerous spots that appear secondary to cumulative UV radiation exposure/sun exposure over time. They are chronic with expected duration over 1 year. A portion of actinic keratoses will progress to squamous cell carcinoma of the skin. It is not possible to reliably predict which spots will progress to skin cancer and so treatment is recommended to prevent development of skin cancer.  Recommend daily broad spectrum sunscreen SPF 30+ to sun-exposed areas, reapply every 2 hours as needed.  Recommend staying in the shade or wearing long sleeves, sun glasses (UVA+UVB protection) and wide brim hats (4-inch brim around the entire circumference of the hat). Call for new or changing lesions.   Destruction of lesion - Vertex Scalp x 1  Destruction method: cryotherapy   Informed consent: discussed and consent obtained   Lesion destroyed using liquid nitrogen: Yes   Cryotherapy cycles:  1 Outcome: patient tolerated procedure well with no complications   Post-procedure details: wound care instructions given    Related Medications Fluorouracil 5 % SOLN Apply twice daily to affected  areas on scalp for 2 weeks. Apply first  Calcipotriene 0.005 % solution Apply twice daily to scalp for 2 weeks. Apply 2nd    Return in about 6 months (around 03/19/2023) for TBSE, Hx AK, Seb Derm, DSAP.  Anise Salvo, RMA, am acting as scribe for  Elie Goody, MD .   Documentation: I have reviewed the above documentation for accuracy and completeness, and I agree with the above.  Elie Goody, MD

## 2022-11-15 ENCOUNTER — Other Ambulatory Visit: Payer: Self-pay | Admitting: Internal Medicine

## 2022-11-15 DIAGNOSIS — Z1231 Encounter for screening mammogram for malignant neoplasm of breast: Secondary | ICD-10-CM

## 2022-12-12 ENCOUNTER — Encounter: Payer: Self-pay | Admitting: Dermatology

## 2022-12-12 ENCOUNTER — Ambulatory Visit: Payer: Medicare HMO | Admitting: Dermatology

## 2022-12-12 DIAGNOSIS — D485 Neoplasm of uncertain behavior of skin: Secondary | ICD-10-CM

## 2022-12-12 DIAGNOSIS — C44629 Squamous cell carcinoma of skin of left upper limb, including shoulder: Secondary | ICD-10-CM | POA: Diagnosis not present

## 2022-12-12 DIAGNOSIS — C4492 Squamous cell carcinoma of skin, unspecified: Secondary | ICD-10-CM

## 2022-12-12 DIAGNOSIS — D492 Neoplasm of unspecified behavior of bone, soft tissue, and skin: Secondary | ICD-10-CM

## 2022-12-12 HISTORY — DX: Squamous cell carcinoma of skin, unspecified: C44.92

## 2022-12-12 NOTE — Patient Instructions (Signed)

## 2022-12-12 NOTE — Progress Notes (Signed)
   Follow-Up Visit   Subjective  Sheryl Kennedy is a 77 y.o. female who presents for the following: spot at left arm, present for a few months, sore.  The patient has spots, moles and lesions to be evaluated, some may be new or changing and the patient may have concern these could be cancer.   The following portions of the chart were reviewed this encounter and updated as appropriate: medications, allergies, medical history  Review of Systems:  No other skin or systemic complaints except as noted in HPI or Assessment and Plan.  Objective  Well appearing patient in no apparent distress; mood and affect are within normal limits.   A focused examination was performed of the following areas: Left arm  Relevant exam findings are noted in the Assessment and Plan.  Left Forearm 11 mm keratotic papule       Assessment & Plan     Neoplasm of uncertain behavior of skin Left Forearm  Skin / nail biopsy Type of biopsy: tangential   Informed consent: discussed and consent obtained   Timeout: patient name, date of birth, surgical site, and procedure verified   Procedure prep:  Patient was prepped and draped in usual sterile fashion Prep type:  Isopropyl alcohol Anesthesia: the lesion was anesthetized in a standard fashion   Anesthetic:  1% lidocaine w/ epinephrine 1-100,000 buffered w/ 8.4% NaHCO3 Instrument used: DermaBlade   Hemostasis achieved with: pressure and aluminum chloride   Outcome: patient tolerated procedure well   Post-procedure details: sterile dressing applied and wound care instructions given   Dressing type: bandage and petrolatum    Specimen 1 - Surgical pathology Differential Diagnosis: r/o SCC  Check Margins: No 11 mm keratotic papule    Return for as scheduled, with Dr. Roseanne Reno.  Anise Salvo, RMA, am acting as scribe for Elie Goody, MD .   Documentation: I have reviewed the above documentation for accuracy and completeness, and I  agree with the above.  Elie Goody, MD

## 2022-12-15 LAB — SURGICAL PATHOLOGY

## 2022-12-19 ENCOUNTER — Telehealth: Payer: Self-pay

## 2022-12-19 NOTE — Telephone Encounter (Signed)
Discussed pathology results. Patient voiced understanding. Excision scheduled for 02/01/2023. Patient was concerned the skin cancer would spread with appointment that far out. Advised it is safe to wait until 02/01/2023.

## 2022-12-19 NOTE — Telephone Encounter (Signed)
-----   Message from [place] sent at 12/19/2022  1:43 PM EST ----- Diagnosis: left forearm :       SQUAMOUS CELL CARCINOMA, KERATOACANTHOMA TYPE    Please call to share diagnosis and discuss excision  Explanation: This is a squamous cell skin cancer that has grown beyond the surface of the skin and is invading the second layer of the skin. It has the potential to spread beyond the skin and threaten your health, so I recommend treating it.  Treatment: you return for an hour long appointment where we perform a skin surgery. We numb the site of the skin cancer and a safety margin of normal skin around it. We remove the full thickness of skin and close the wound with two layers of stitches. The sample is sent to the lab to check that the skin cancer was fully removed. Return one week later to have wound checked and surface stitches removed. Surgical wound leaves a line scar. Approximately 95% cure rate. Risk of recurrence, bleeding, infection, pain, injury to nearby structures, hypertrophic scar.

## 2023-02-01 ENCOUNTER — Ambulatory Visit (INDEPENDENT_AMBULATORY_CARE_PROVIDER_SITE_OTHER): Payer: Medicare HMO | Admitting: Dermatology

## 2023-02-01 ENCOUNTER — Encounter: Payer: Self-pay | Admitting: Dermatology

## 2023-02-01 DIAGNOSIS — C44629 Squamous cell carcinoma of skin of left upper limb, including shoulder: Secondary | ICD-10-CM

## 2023-02-01 DIAGNOSIS — C4492 Squamous cell carcinoma of skin, unspecified: Secondary | ICD-10-CM

## 2023-02-01 MED ORDER — MUPIROCIN 2 % EX OINT
1.0000 | TOPICAL_OINTMENT | Freq: Every day | CUTANEOUS | 0 refills | Status: DC
Start: 2023-02-01 — End: 2023-09-14

## 2023-02-01 NOTE — Progress Notes (Signed)
   Follow-Up Visit   Subjective  Sheryl Kennedy is a 78 y.o. female who presents for the following: Excision of bx proven SQUAMOUS CELL CARCINOMA, KERATOACANTHOMA TYPE at left forearm.   The following portions of the chart were reviewed this encounter and updated as appropriate: medications, allergies, medical history  Review of Systems:  No other skin or systemic complaints except as noted in HPI or Assessment and Plan.  Objective  Well appearing patient in no apparent distress; mood and affect are within normal limits.  A focused examination was performed of the following areas: Left forearm Relevant physical exam findings are noted in the Assessment and Plan.   Left Forearm Healing biopsy site  Assessment & Plan   SQUAMOUS CELL CARCINOMA OF SKIN Left Forearm Skin excision  Lesion length (cm):  1 Margin per side (cm):  0.4 Total excision diameter (cm):  1.8 Informed consent: discussed and consent obtained   Timeout: patient name, date of birth, surgical site, and procedure verified   Procedure prep:  Patient was prepped and draped in usual sterile fashion Prep type:  Chlorhexidine Anesthesia: the lesion was anesthetized in a standard fashion   Anesthetic:  1% lidocaine w/ epinephrine 1-100,000 buffered w/ 8.4% NaHCO3 (6 cc lido w/epi, 4 cc bupivicaine) Instrument used: #15 blade   Hemostasis achieved with: pressure   Outcome: patient tolerated procedure well with no complications    Skin repair Complexity:  Intermediate Final length (cm):  5.5 Informed consent: discussed and consent obtained   Timeout: patient name, date of birth, surgical site, and procedure verified   Procedure prep:  Patient was prepped and draped in usual sterile fashion Prep type:  Chlorhexidine Anesthesia: the lesion was anesthetized in a standard fashion   Anesthetic:  1% lidocaine w/ epinephrine 1-100,000 buffered w/ 8.4% NaHCO3 Reason for type of repair: reduce tension to allow closure,  reduce the risk of dehiscence, infection, and necrosis, reduce subcutaneous dead space and avoid a hematoma, allow closure of the large defect and preserve normal anatomy   Undermining: edges could be approximated without difficulty   Subcutaneous layers (deep stitches):  Suture size:  4-0 Suture type: Monocryl (poliglecaprone 25)   Stitches:  Buried vertical mattress Fine/surface layer approximation (top stitches):  Suture size:  5-0 Suture type: Prolene (polypropylene)   Stitches: simple running   Suture removal (days):  7 Hemostasis achieved with: suture, pressure and electrodesiccation Outcome: patient tolerated procedure well with no complications   Post-procedure details: sterile dressing applied and wound care instructions given   Dressing type: petrolatum, bandage and pressure dressing   Specimen 1 - Surgical pathology Differential Diagnosis: BX proven SQUAMOUS CELL CARCINOMA, KERATOACANTHOMA TYPE Lateral tag Check Margins: yes Healing biopsy site [id number] Related Medications mupirocin ointment (BACTROBAN) 2 % Apply 1 Application topically daily.   Return in about 1 week (around 02/08/2023) for Suture Removal, with Dr. Katrinka Blazing.  Anise Salvo, RMA, am acting as scribe for Elie Goody, MD .   Documentation: I have reviewed the above documentation for accuracy and completeness, and I agree with the above.  Elie Goody, MD

## 2023-02-01 NOTE — Patient Instructions (Signed)

## 2023-02-02 ENCOUNTER — Telehealth: Payer: Self-pay

## 2023-02-02 ENCOUNTER — Other Ambulatory Visit: Payer: Self-pay

## 2023-02-02 NOTE — Telephone Encounter (Signed)
error

## 2023-02-02 NOTE — Telephone Encounter (Signed)
Patient called for a refill of [facility] Continue Cholesterol/Lovastatin cream for her legs.   Rx sent to [facility]

## 2023-02-06 ENCOUNTER — Telehealth: Payer: Self-pay

## 2023-02-06 ENCOUNTER — Encounter: Payer: Self-pay | Admitting: Dermatology

## 2023-02-06 LAB — SURGICAL PATHOLOGY

## 2023-02-06 NOTE — Telephone Encounter (Signed)
Patient advised margins free, no residual SCC, keep SR appt tomorrow.  Butch Penny., RMA

## 2023-02-06 NOTE — Telephone Encounter (Signed)
-----   Message from [place] sent at 02/06/2023  1:44 PM EST ----- Diagnosis left forearm :       RESIDUAL SQUAMOUS CELL CARCINOMA, MARGINS FREE    mychart

## 2023-02-07 ENCOUNTER — Ambulatory Visit (INDEPENDENT_AMBULATORY_CARE_PROVIDER_SITE_OTHER): Payer: Medicare HMO | Admitting: Dermatology

## 2023-02-07 ENCOUNTER — Encounter: Payer: Self-pay | Admitting: Dermatology

## 2023-02-07 DIAGNOSIS — Z5189 Encounter for other specified aftercare: Secondary | ICD-10-CM

## 2023-02-07 DIAGNOSIS — Z4802 Encounter for removal of sutures: Secondary | ICD-10-CM

## 2023-02-07 NOTE — Progress Notes (Signed)
   Follow-Up Visit   Subjective  Sheryl Kennedy is a 78 y.o. female who presents for the following: Suture removal  Pathology showed a margins free squamous cell carcinoma.  The following portions of the chart were reviewed this encounter and updated as appropriate: medications, allergies, medical history  Review of Systems:  No other skin or systemic complaints except as noted in HPI or Assessment and Plan.  Objective  Well appearing patient in no apparent distress; mood and affect are within normal limits.  Areas Examined: The left forearm  Relevant physical exam findings are noted in the Assessment and Plan.    Assessment & Plan   VISIT FOR WOUND CHECK   ENCOUNTER FOR REMOVAL OF SUTURES   Encounter for Removal of Sutures - Incision site is clean, dry and intact. - Wound cleansed, sutures removed, wound cleansed and steri strips applied.  - Discussed pathology results showing a margins free squamous cell carcinoma. - Patient advised to keep steri-strips dry until they fall off. - Scars remodel for a full year. - Once steri-strips fall off, patient can apply over-the-counter silicone scar cream once to twice a day to help with scar remodeling if desired. - Patient advised to call with any concerns or if they notice any new or changing lesions.  Return for appointment as scheduled.  Sheryl Kennedy, CMA, am acting as scribe for Boneta Sharps, MD .   Documentation: I have reviewed the above documentation for accuracy and completeness, and I agree with the above.  Boneta Sharps, MD

## 2023-02-07 NOTE — Patient Instructions (Signed)

## 2023-02-28 ENCOUNTER — Ambulatory Visit
Admission: RE | Admit: 2023-02-28 | Discharge: 2023-02-28 | Disposition: A | Discharge status: Home / Self Care | Payer: Medicare HMO | Source: Ambulatory Visit | Attending: Internal Medicine | Admitting: Internal Medicine

## 2023-02-28 DIAGNOSIS — Z1231 Encounter for screening mammogram for malignant neoplasm of breast: Secondary | ICD-10-CM | POA: Diagnosis present

## 2023-03-14 ENCOUNTER — Ambulatory Visit: Payer: Medicare HMO | Admitting: Dermatology

## 2023-03-21 ENCOUNTER — Ambulatory Visit: Admitting: Dermatology

## 2023-03-21 DIAGNOSIS — L570 Actinic keratosis: Secondary | ICD-10-CM

## 2023-03-21 DIAGNOSIS — L578 Other skin changes due to chronic exposure to nonionizing radiation: Secondary | ICD-10-CM

## 2023-03-21 DIAGNOSIS — L814 Other melanin hyperpigmentation: Secondary | ICD-10-CM | POA: Diagnosis not present

## 2023-03-21 DIAGNOSIS — L565 Disseminated superficial actinic porokeratosis (DSAP): Secondary | ICD-10-CM

## 2023-03-21 DIAGNOSIS — Z79899 Other long term (current) drug therapy: Secondary | ICD-10-CM

## 2023-03-21 DIAGNOSIS — Z85828 Personal history of other malignant neoplasm of skin: Secondary | ICD-10-CM

## 2023-03-21 DIAGNOSIS — D2261 Melanocytic nevi of right upper limb, including shoulder: Secondary | ICD-10-CM

## 2023-03-21 DIAGNOSIS — L219 Seborrheic dermatitis, unspecified: Secondary | ICD-10-CM

## 2023-03-21 DIAGNOSIS — Z1283 Encounter for screening for malignant neoplasm of skin: Secondary | ICD-10-CM | POA: Diagnosis not present

## 2023-03-21 DIAGNOSIS — L305 Pityriasis alba: Secondary | ICD-10-CM

## 2023-03-21 DIAGNOSIS — D692 Other nonthrombocytopenic purpura: Secondary | ICD-10-CM

## 2023-03-21 DIAGNOSIS — L821 Other seborrheic keratosis: Secondary | ICD-10-CM

## 2023-03-21 DIAGNOSIS — D1801 Hemangioma of skin and subcutaneous tissue: Secondary | ICD-10-CM

## 2023-03-21 DIAGNOSIS — Z7189 Other specified counseling: Secondary | ICD-10-CM

## 2023-03-21 DIAGNOSIS — W908XXA Exposure to other nonionizing radiation, initial encounter: Secondary | ICD-10-CM | POA: Diagnosis not present

## 2023-03-21 DIAGNOSIS — D229 Melanocytic nevi, unspecified: Secondary | ICD-10-CM

## 2023-03-21 MED ORDER — FLUOCINOLONE ACETONIDE BODY 0.01 % EX OIL: TOPICAL_OIL | CUTANEOUS | 5 refills | Status: DC

## 2023-03-21 NOTE — Progress Notes (Signed)
 Follow-Up Visit   Subjective  Sheryl Kennedy is a 78 y.o. female who presents for the following: Skin Cancer Screening and Full Body Skin Exam  The patient presents for Total-Body Skin Exam (TBSE) for skin cancer screening and mole check. The patient has spots, moles and lesions to be evaluated, some may be new or changing. History of SCC Left forearm, R ant lower leg. History of Aks, has used topical 5FU/Calcipotriene solution in the past to scalp. She has had PDT to legs in past.   The following portions of the chart were reviewed this encounter and updated as appropriate: medications, allergies, medical history  Review of Systems:  No other skin or systemic complaints except as noted in HPI or Assessment and Plan.  Objective  Well appearing patient in no apparent distress; mood and affect are within normal limits.  A full examination was performed including scalp, head, eyes, ears, nose, lips, neck, chest, axillae, abdomen, back, buttocks, bilateral upper extremities, bilateral lower extremities, hands, feet, fingers, toes, fingernails, and toenails. All findings within normal limits unless otherwise noted below.   Relevant physical exam findings are noted in the Assessment and Plan.  L forearm x 2, R forearm x 1, R upper ant thigh x 2, L lower leg x 4, R lower leg x 12, R chest x 1, L vertex scalp x 3, R crown scalp x 1, frontal scalp x 1, R malar cheek x 1 (28) Pink keratotic macules.   Left posterior calf      Assessment & Plan   SKIN CANCER SCREENING PERFORMED TODAY.  ACTINIC DAMAGE WITH PRECANCEROUS ACTINIC KERATOSES Counseling for Topical Chemotherapy Management: Patient exhibits: - Severe, confluent actinic changes with pre-cancerous actinic keratoses that is secondary to cumulative UV radiation exposure over time - Condition that is severe; chronic, not at goal. - diffuse scaly erythematous macules and papules with underlying dyspigmentation - Discussed  Prescription "Field Treatment" topical Chemotherapy for Severe, Chronic Confluent Actinic Changes with Pre-Cancerous Actinic Keratoses Field treatment involves treatment of an entire area of skin that has confluent Actinic Changes (Sun/ Ultraviolet light damage) and PreCancerous Actinic Keratoses by method of PhotoDynamic Therapy (PDT) and/or prescription Topical Chemotherapy agents such as 5-fluorouracil, 5-fluorouracil/calcipotriene, and/or imiquimod.  The purpose is to decrease the number of clinically evident and subclinical PreCancerous lesions to prevent progression to development of skin cancer by chemically destroying early precancer changes that may or may not be visible.  It has been shown to reduce the risk of developing skin cancer in the treated area. As a result of treatment, redness, scaling, crusting, and open sores may occur during treatment course. One or more than one of these methods may be used and may have to be used several times to control, suppress and eliminate the PreCancerous changes. Discussed treatment course, expected reaction, and possible side effects. - Recommend daily broad spectrum sunscreen SPF 30+ to sun-exposed areas, reapply every 2 hours as needed.  - Staying in the shade or wearing long sleeves, sun glasses (UVA+UVB protection) and wide brim hats (4-inch brim around the entire circumference of the hat) are also recommended. - Call for new or changing lesions. - Recommend PDT Blue light treatment to bilateral lower legs.  Last tx 03/19/2020.  LENTIGINES, SEBORRHEIC KERATOSES, HEMANGIOMAS - Benign normal skin lesions - Benign-appearing - Call for any changes  MELANOCYTIC NEVI - Tan-brown and/or pink-flesh-colored symmetric macules and papules - 2.0 mm dark gray brown macule at right forearm (Nevus vs Lentigo) - Benign appearing  on exam today - Observation - Call clinic for new or changing moles - Recommend daily use of broad spectrum spf 30+ sunscreen to  sun-exposed areas.   Purpura - Chronic; persistent and recurrent.  Treatable, but not curable. - 9mm purpuric patch with focal scale at left mid calf, photo taken today. Recheck on f/up. 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 Condition is improving with treatment but not currently at goal.    Seborrheic Dermatitis  -  is a chronic persistent rash characterized by pinkness and scaling most commonly of the mid face but also can occur on the scalp (dandruff), ears; mid chest, mid back and groin.  It tends to be exacerbated by stress and cooler weather.  People who have neurologic disease may experience new onset or exacerbation of existing seborrheic dermatitis.  The condition is not curable but treatable and can be controlled.   Treatment Plan:   Continue Ketoconazole 2 % shampoo - apply topically as directed. Wash scalp 2 - 3 times weekly. Let sit 5 minutes and rinse out. Alternate with T Sal shampoo as directed    Continue Fluocinolone Acetonide body 0.01 % oil - apply topically to crown of scalp 2x/wk prn, cover with shower cap nightly before bed. Wash out in AM   Used only when flared/itchy Continue Clobetasol 0.05 % external solution - apply topically to scalp in morning after shampoo qd prn for itchy scalp. Avoid applying to face, groin, and axilla. Use as directed. Long-term use can cause thinning of the skin.     Topical steroids (such as triamcinolone, fluocinolone, fluocinonide, mometasone, clobetasol, halobetasol, betamethasone, hydrocortisone) can cause thinning and lightening of  the skin if they are used for too long in the same area. Your physician has selected the right strength medicine for your problem and area affected on the body. Please use your medication only as directed by your physician to prevent side effects.   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 Condition is symptomatic/ bothersome to patient. Not currently at goal. 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  Difficult to treat.  Recommend photoprotection and regular use of spf 30 or higher sunscreen to prevent worsening of condition and precancerous changes.   Continue SM Cholesterol/Lovastatin cream BID.   HISTORY OF SQUAMOUS CELL CARCINOMA OF THE SKIN Left forearm, exc 02/01/2023 R ant lower leg, 2014  - No evidence of recurrence today - Recommend regular full body skin exams - Recommend daily broad spectrum sunscreen SPF 30+ to sun-exposed areas, reapply every 2 hours as needed.  - Call if any new or changing lesions are noted between office visits  AK (ACTINIC KERATOSIS) (28) L forearm x 2, R forearm x 1, R upper ant thigh x 2, L lower leg x 4, R lower leg x 12, R chest x 1, L vertex scalp x 3, R crown scalp x 1, frontal scalp x 1, R malar cheek x 1 (  28) Actinic keratoses are precancerous spots that appear secondary to cumulative UV radiation exposure/sun exposure over time. They are chronic with expected duration over 1 year. A portion of actinic keratoses will progress to squamous cell carcinoma of the skin. It is not possible to reliably predict which spots will progress to skin cancer and so treatment is recommended to prevent development of skin cancer.  Recommend daily broad spectrum sunscreen SPF 30+ to sun-exposed areas, reapply every 2 hours as needed.  Recommend  staying in the shade or wearing long sleeves, sun glasses (UVA+UVB protection) and wide brim hats (4-inch brim around the entire circumference of the hat). Call for new or changing lesions. Destruction of lesion - L forearm x 2, R forearm x 1, R upper ant thigh x 2, L lower leg x 4, R lower leg x 12, R chest x 1, L vertex scalp x 3, R crown scalp x 1, frontal scalp x 1, R malar cheek x 1 (28)  Destruction method: cryotherapy   Informed consent: discussed and consent obtained   Lesion destroyed using liquid nitrogen: Yes   Region frozen until ice ball extended beyond lesion: Yes   Outcome: patient tolerated procedure well with no complications   Post-procedure details: wound care instructions given   Additional details:  Prior to procedure, discussed risks of blister formation, small wound, skin dyspigmentation, or rare scar following cryotherapy. Recommend Vaseline ointment to treated areas while healing.  Related Medications Fluorouracil 5 % SOLN Apply twice daily to affected areas on scalp for 2 weeks. Apply first Calcipotriene 0.005 % solution Apply twice daily to scalp for 2 weeks. Apply 2nd SEBORRHEIC DERMATITIS   Related Medications clobetasol (TEMOVATE) 0.05 % external solution APPLY TOPICALLY TO SCALP IN THE MORNING AFTER SHAMPOO FOR ITCHY SCALP ketoconazole (NIZORAL) 2 % shampoo WASH SCALP 2-3 TIMES WEEKLY, LET SIT 5 MINUTES AND RINSE Fluocinolone Acetonide Body 0.01 % OIL Apply topically to itchy scaly spot at scalp cover with shower cap qhs Return for Blue light to lower legs if covered. 3-4 mos f/u legs and scalp, recheck calf.  ICherlyn Labella, CMA, am acting as scribe for Willeen Niece, MD .   Documentation: I have reviewed the above documentation for accuracy and completeness, and I agree with the above.  Willeen Niece, MD

## 2023-03-21 NOTE — Patient Instructions (Addendum)

## 2023-06-26 ENCOUNTER — Ambulatory Visit: Admitting: Dermatology

## 2023-07-31 ENCOUNTER — Ambulatory Visit: Admitting: Dermatology

## 2023-07-31 DIAGNOSIS — L219 Seborrheic dermatitis, unspecified: Secondary | ICD-10-CM

## 2023-07-31 DIAGNOSIS — L570 Actinic keratosis: Secondary | ICD-10-CM

## 2023-07-31 DIAGNOSIS — D492 Neoplasm of unspecified behavior of bone, soft tissue, and skin: Secondary | ICD-10-CM

## 2023-07-31 DIAGNOSIS — L448 Other specified papulosquamous disorders: Secondary | ICD-10-CM

## 2023-07-31 DIAGNOSIS — C4442 Squamous cell carcinoma of skin of scalp and neck: Secondary | ICD-10-CM | POA: Diagnosis not present

## 2023-07-31 DIAGNOSIS — C44722 Squamous cell carcinoma of skin of right lower limb, including hip: Secondary | ICD-10-CM | POA: Diagnosis not present

## 2023-07-31 DIAGNOSIS — C4492 Squamous cell carcinoma of skin, unspecified: Secondary | ICD-10-CM

## 2023-07-31 DIAGNOSIS — W908XXA Exposure to other nonionizing radiation, initial encounter: Secondary | ICD-10-CM | POA: Diagnosis not present

## 2023-07-31 DIAGNOSIS — L565 Disseminated superficial actinic porokeratosis (DSAP): Secondary | ICD-10-CM | POA: Diagnosis not present

## 2023-07-31 DIAGNOSIS — L305 Pityriasis alba: Secondary | ICD-10-CM | POA: Diagnosis not present

## 2023-07-31 DIAGNOSIS — L578 Other skin changes due to chronic exposure to nonionizing radiation: Secondary | ICD-10-CM

## 2023-07-31 DIAGNOSIS — Z7189 Other specified counseling: Secondary | ICD-10-CM

## 2023-07-31 HISTORY — DX: Squamous cell carcinoma of skin, unspecified: C44.92

## 2023-07-31 NOTE — Progress Notes (Addendum)
 Follow-Up Visit   Subjective  Sheryl Kennedy is a 78 y.o. female who presents for the following: AK 53m f/u, legs,scalp Seb derm with Pityriasis Amiantacea scalp 10m f/u Ketoconazole  2% shampoo every other day, Fluocinoline oil ~2x/wk, Clobetasol  sol prn, not much better.  Has scaly bump on leg, tender.   The following portions of the chart were reviewed this encounter and updated as appropriate: medications, allergies, medical history  Review of Systems:  No other skin or systemic complaints except as noted in HPI or Assessment and Plan.  Objective  Well appearing patient in no apparent distress; mood and affect are within normal limits.   A focused examination was performed of the following areas: Scalp, legs  Relevant exam findings are noted in the Assessment and Plan.  R lat lower leg 1.3cm pink tan scaly pap  R crown scalp 1.2cm pink crusted plaque  R med upper ankle x 3, R lat upper ankle x 2, L lat calf x 4 (9) Pink scaly macules  Assessment & Plan   SEBORRHEIC DERMATITIS With Pityriasis Amiantacea Scalp  Exam: focals areas of thick crusted papules with matted hairs  Chronic and persistent condition with duration or expected duration over one year. Condition is symptomatic / bothersome to patient. Not to goal.   Seborrheic Dermatitis is a chronic persistent rash characterized by pinkness and scaling most commonly of the mid face but also can occur on the scalp (dandruff), ears; mid chest, mid back and groin.  It tends to be exacerbated by stress and cooler weather.  People who have neurologic disease may experience new onset or exacerbation of existing seborrheic dermatitis.  The condition is not curable but treatable and can be controlled.  Treatment Plan: Cont Ketoconazole  2% shampoo 2-3x/wk Cont Fluocinolone  oil to crown scalp 2-3x/wk, cover with shower cap and wash out in the am Cont  Clobetasol  sol qam prn flared   Topical steroids (such as triamcinolone ,  fluocinolone , fluocinonide , mometasone, clobetasol , halobetasol, betamethasone , hydrocortisone) can cause thinning and lightening of the skin if they are used for too long in the same area. Your physician has selected the right strength medicine for your problem and area affected on the body. Please use your medication only as directed by your physician to prevent side effects.   DSAP (disseminated superficial actinic porokeratosis) Exam: Legs Pink scaly macules with keratotic rim    Chronic and persistent condition with duration or expected duration over one year. Condition is symptomatic/ bothersome to patient. Not currently at goal. 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  Difficult to treat.  Recommend photoprotection and regular use of spf 30 or higher sunscreen to prevent worsening of condition and precancerous changes.   Treatment: D/C SM Cholesterol/Lovastatin cream BID.  ACTINIC DAMAGE WITH PRECANCEROUS ACTINIC KERATOSES Counseling for Topical Chemotherapy Management: Patient exhibits: - Severe, confluent actinic changes with pre-cancerous actinic keratoses that is secondary to cumulative UV radiation exposure over time - Condition that is severe; chronic, not at goal. - diffuse scaly erythematous macules and papules with underlying dyspigmentation - Discussed Prescription Field Treatment topical Chemotherapy for Severe, Chronic Confluent Actinic Changes with Pre-Cancerous Actinic Keratoses Field treatment involves treatment of an entire area of skin that has confluent Actinic Changes (Sun/ Ultraviolet light damage) and PreCancerous Actinic Keratoses by method of PhotoDynamic Therapy (PDT) and/or prescription Topical Chemotherapy agents such as 5-fluorouracil , 5-fluorouracil /calcipotriene , and/or imiquimod.  The purpose is to decrease the number of  clinically evident and subclinical PreCancerous lesions to  prevent progression to development of skin cancer by chemically destroying early precancer changes that may or may not be visible.  It has been shown to reduce the risk of developing skin cancer in the treated area. As a result of treatment, redness, scaling, crusting, and open sores may occur during treatment course. One or more than one of these methods may be used and may have to be used several times to control, suppress and eliminate the PreCancerous changes. Discussed treatment course, expected reaction, and possible side effects. - Recommend daily broad spectrum sunscreen SPF 30+ to sun-exposed areas, reapply every 2 hours as needed.  - Staying in the shade or wearing long sleeves, sun glasses (UVA+UVB protection) and wide brim hats (4-inch brim around the entire circumference of the hat) are also recommended. - Call for new or changing lesions.  -Plan Blue light PDT with debridement lower legs x 2 treatments if covered by insurance, incubation time 2 hours NEOPLASM OF SKIN (2) R lat lower leg Epidermal / dermal shaving  Lesion diameter (cm):  1.3 Informed consent: discussed and consent obtained   Patient was prepped and draped in usual sterile fashion: area prepped with alcohol. Anesthesia: the lesion was anesthetized in a standard fashion   Anesthetic:  1% lidocaine  w/ epinephrine  1-100,000 buffered w/ 8.4% NaHCO3 Instrument used: flexible razor blade   Hemostasis achieved with: pressure, aluminum chloride and electrodesiccation   Outcome: patient tolerated procedure well   Post-procedure details: wound care instructions given   Post-procedure details comment:  Ointment and small bandage applied  Specimen 1 - Surgical pathology Differential Diagnosis: ISK r/o SCC  Check Margins: yes 1.3cm pink tan scaly pap R crown scalp Skin / nail biopsy Type of biopsy: tangential   Informed consent: discussed and consent obtained   Anesthesia: the lesion was anesthetized in a standard fashion    Anesthesia comment:  Area prepped with alcohol Anesthetic:  1% lidocaine  w/ epinephrine  1-100,000 buffered w/ 8.4% NaHCO3 Instrument used: flexible razor blade   Hemostasis achieved with: pressure, aluminum chloride and electrodesiccation   Outcome: patient tolerated procedure well   Post-procedure details: wound care instructions given   Post-procedure details comment:  Ointment and small bandage applied  Specimen 2 - Surgical pathology Differential Diagnosis: Seborrheic dermatitis vs Hypertrophic AK r/o SCC  Check Margins: No 1.2cm pink crusted plaque 08/02/23 [facility] called and the scalp bx was in specimen # 1 which was labeled R lower leg on requisition and the R lower leg bx was in speciman #2 which was labeled scalp on requisition.  It has been corrected at [facility] (her scalp hairs were identifiable).  I tried doing an addendum in note and changing specimen numbers on the avatar but it would not let me change them.  But the pathology results will be correct./sh  AK (ACTINIC KERATOSIS) (9) R med upper ankle x 3, R lat upper ankle x 2, L lat calf x 4 (9) Actinic keratoses are precancerous spots that appear secondary to cumulative UV radiation exposure/sun exposure over time. They are chronic with expected duration over 1 year. A portion of actinic keratoses will progress to squamous cell carcinoma of the skin. It is not possible to reliably predict which spots will progress to skin cancer and so treatment is recommended to prevent development of skin cancer.  Recommend daily broad spectrum sunscreen SPF 30+ to sun-exposed areas, reapply every 2 hours as needed.  Recommend staying in the shade or wearing long  sleeves, sun glasses (UVA+UVB protection) and wide brim hats (4-inch brim around the entire circumference of the hat). Call for new or changing lesions. Destruction of lesion - R med upper ankle x 3, R lat upper ankle x 2, L lat calf x 4 (9)  Destruction method: cryotherapy   Informed  consent: discussed and consent obtained   Lesion destroyed using liquid nitrogen: Yes   Region frozen until ice ball extended beyond lesion: Yes   Outcome: patient tolerated procedure well with no complications   Post-procedure details: wound care instructions given   Additional details:  Prior to procedure, discussed risks of blister formation, small wound, skin dyspigmentation, or rare scar following cryotherapy. Recommend Vaseline ointment to treated areas while healing.   SEBORRHEIC DERMATITIS   Related Medications clobetasol  (TEMOVATE ) 0.05 % external solution APPLY TOPICALLY TO SCALP IN THE MORNING AFTER SHAMPOO FOR ITCHY SCALP ketoconazole  (NIZORAL ) 2 % shampoo WASH SCALP 2-3 TIMES WEEKLY, LET SIT 5 MINUTES AND RINSE Fluocinolone  Acetonide Body 0.01 % OIL Apply topically to itchy scaly spot at scalp cover with shower cap qhs PITYRIASIS AMIANTACEA   DSAP (DISSEMINATED SUPERFICIAL ACTINIC POROKERATOSIS)   ACTINIC SKIN DAMAGE    Return for blue light w/ debridement to legs x 2 treatments,  incubation 2 hours, 78m f/u AKs, seb derm.  I, Grayce Saunas, RMA, am acting as scribe for Rexene Rattler, MD .   Documentation: I have reviewed the above documentation for accuracy and completeness, and I agree with the above.  Rexene Rattler, MD

## 2023-07-31 NOTE — Patient Instructions (Signed)

## 2023-08-03 LAB — SURGICAL PATHOLOGY

## 2023-08-07 ENCOUNTER — Ambulatory Visit: Admitting: Dermatology

## 2023-08-09 ENCOUNTER — Encounter: Payer: Self-pay | Admitting: Dermatology

## 2023-08-09 ENCOUNTER — Ambulatory Visit: Payer: Self-pay | Admitting: Dermatology

## 2023-08-09 DIAGNOSIS — C4492 Squamous cell carcinoma of skin, unspecified: Secondary | ICD-10-CM

## 2023-08-09 NOTE — Telephone Encounter (Signed)
 Patient has been advised of BX results and went into detail regarding MOHs surgery. All questions answered.   Referral placed to Dr. Paci. aw

## 2023-09-05 ENCOUNTER — Ambulatory Visit: Admitting: Dermatology

## 2023-09-06 ENCOUNTER — Encounter: Payer: Self-pay | Admitting: Dermatology

## 2023-09-11 ENCOUNTER — Encounter: Payer: Self-pay | Admitting: Dermatology

## 2023-09-11 ENCOUNTER — Ambulatory Visit (INDEPENDENT_AMBULATORY_CARE_PROVIDER_SITE_OTHER): Admitting: Dermatology

## 2023-09-11 DIAGNOSIS — L578 Other skin changes due to chronic exposure to nonionizing radiation: Secondary | ICD-10-CM

## 2023-09-11 DIAGNOSIS — L570 Actinic keratosis: Secondary | ICD-10-CM

## 2023-09-11 DIAGNOSIS — W908XXA Exposure to other nonionizing radiation, initial encounter: Secondary | ICD-10-CM

## 2023-09-11 DIAGNOSIS — C44722 Squamous cell carcinoma of skin of right lower limb, including hip: Secondary | ICD-10-CM

## 2023-09-11 NOTE — Patient Instructions (Signed)
 Electrodesiccation and Curettage ("Scrape and Burn") Wound Care Instructions  Leave the original bandage on for 24 hours if possible.  If the bandage becomes soaked or soiled before that time, it is OK to remove it and examine the wound.  A small amount of post-operative bleeding is normal.  If excessive bleeding occurs, remove the bandage, place gauze over the site and apply continuous pressure (no peeking) over the area for 30 minutes. If this does not work, please call our clinic as soon as possible or page your doctor if it is after hours.   Once a day, cleanse the wound with soap and water. It is fine to shower. If a thick crust develops you may use a Q-tip dipped into dilute hydrogen peroxide (mix 1:1 with water) to dissolve it.  Hydrogen peroxide can slow the healing process, so use it only as needed.    After washing, apply petroleum jelly (Vaseline) or an antibiotic ointment if your doctor prescribed one for you, followed by a bandage.    For best healing, the wound should be covered with a layer of ointment at all times. If you are not able to keep the area covered with a bandage to hold the ointment in place, this may mean re-applying the ointment several times a day.  Continue this wound care until the wound has healed and is no longer open. It may take several weeks for the wound to heal and close.  Itching and mild discomfort is normal during the healing process.  If you have any discomfort, you can take Tylenol  (acetaminophen ) or ibuprofen as directed on the bottle. (Please do not take these if you have an allergy to them or cannot take them for another reason).  Some redness, tenderness and white or yellow material in the wound is normal healing.  If the area becomes very sore and red, or develops a thick yellow-green material (pus), it may be infected; please notify us .    Wound healing continues for up to one year following surgery. It is not unusual to experience pain in the scar  from time to time during the interval.  If the pain becomes severe or the scar thickens, you should notify the office.    A slight amount of redness in a scar is expected for the first six months.  After six months, the redness will fade and the scar will soften and fade.  The color difference becomes less noticeable with time.  If there are any problems, return for a post-op surgery check at your earliest convenience.  To improve the appearance of the scar, you can use silicone scar gel, cream, or sheets (such as Mederma or Serica) every night for up to one year. These are available over the counter (without a prescription).  Please call our office at [phone] for any questions or concerns.  Due to recent changes in healthcare laws, you may see results of your pathology and/or laboratory studies on MyChart before the doctors have had a chance to review them. We understand that in some cases there may be results that are confusing or concerning to you. Please understand that not all results are received at the same time and often the doctors may need to interpret multiple results in order to provide you with the best plan of care or course of treatment. Therefore, we ask that you please give us  2 business days to thoroughly review all your results before contacting the office for clarification. Should we see a  critical lab result, you will be contacted sooner.   If You Need Anything After Your Visit  If you have any questions or concerns for your doctor, please call our main line at [phone] and press option 4 to reach your doctor's medical assistant. If no one answers, please leave a voicemail as directed and we will return your call as soon as possible. Messages left after 4 pm will be answered the following business day.   You may also send us  a message via MyChart. We typically respond to MyChart messages within 1-2 business days.  For prescription refills, please ask your pharmacy to  contact our office. Our fax number is [phone].  If you have an urgent issue when the clinic is closed that cannot wait until the next business day, you can page your doctor at the number below.    Please note that while we do our best to be available for urgent issues outside of office hours, we are not available 24/7.   If you have an urgent issue and are unable to reach us , you may choose to seek medical care at your doctor's office, retail clinic, urgent care center, or emergency room.  If you have a medical emergency, please immediately call 911 or go to the emergency department.  Pager Numbers  - Dr. Hester: [phone]  - Dr. Jackquline: [phone]  - Dr. Claudene: [phone]   - Dr. Raymund: [phone]  In the event of inclement weather, please call our main line at [phone] for an update on the status of any delays or closures.  Dermatology Medication Tips: Please keep the boxes that topical medications come in in order to help keep track of the instructions about where and how to use these. Pharmacies typically print the medication instructions only on the boxes and not directly on the medication tubes.   If your medication is too expensive, please contact our office at [phone] option 4 or send us  a message through MyChart.   We are unable to tell what your co-pay for medications will be in advance as this is different depending on your insurance coverage. However, we may be able to find a substitute medication at lower cost or fill out paperwork to get insurance to cover a needed medication.   If a prior authorization is required to get your medication covered by your insurance company, please allow us  1-2 business days to complete this process.  Drug prices often vary depending on where the prescription is filled and some pharmacies may offer cheaper prices.  The website www.goodrx.com contains coupons for medications through different pharmacies. The prices  here do not account for what the cost may be with help from insurance (it may be cheaper with your insurance), but the website can give you the price if you did not use any insurance.  - You can print the associated coupon and take it with your prescription to the pharmacy.  - You may also stop by our office during regular business hours and pick up a GoodRx coupon card.  - If you need your prescription sent electronically to a different pharmacy, notify our office through [facility] or by phone at [phone] option 4.     Si Usted Necesita Algo Despus de Su Visita  Tambin puede enviarnos un mensaje a travs de Clinical cytogeneticist. Por lo general respondemos a los mensajes de MyChart en el transcurso de 1 a 2 das hbiles.  Para renovar recetas, por favor pida a su farmacia que  se ponga en contacto con nuestra oficina. Randi lakes de fax es [place] [phone].  Si tiene un asunto urgente cuando la clnica est cerrada y que no puede esperar hasta el siguiente da hbil, puede llamar/localizar a su doctor(a) al nmero que aparece a continuacin.   Por favor, tenga en cuenta que aunque hacemos todo lo posible para estar disponibles para asuntos urgentes fuera del horario de [place], no estamos disponibles las 24 horas del da, los 7 [address]  [address] de la [place].   Si tiene un problema urgente y no puede comunicarse con nosotros, puede optar por buscar atencin mdica  en el consultorio de su doctor(a), en una clnica privada, en un centro de atencin urgente o en una sala de emergencias.  Si tiene Engineer, drilling, por favor llame inmediatamente al 911 o vaya a la sala de emergencias.  Nmeros de bper  - Dr. Hester: [phone]  - Dra. Jackquline: [phone]  - Dr. Claudene: [phone]  - Dra. Kitts: [phone]  En caso de inclemencias del [place], por favor llame a nuestra lnea principal al [phone] para una actualizacin sobre el estado de cualquier retraso o cierre.  Consejos  para la medicacin en dermatologa: Por favor, guarde las cajas en las que vienen los medicamentos de uso tpico para ayudarle a seguir las instrucciones sobre dnde y cmo usarlos. Las farmacias generalmente imprimen las instrucciones del medicamento slo en las cajas y no directamente en los tubos del [place].   Si su medicamento es muy caro, por favor, pngase en contacto con landry rieger llamando al [phone] y presione la opcin 4 o envenos un mensaje a travs de Clinical cytogeneticist.   No podemos decirle cul ser su copago por los medicamentos por adelantado ya que esto es diferente dependiendo de la cobertura de su seguro. Sin embargo, es posible que podamos encontrar un medicamento sustituto a Audiological scientist un formulario para que el seguro cubra el medicamento que se considera necesario.   Si se requiere una autorizacin previa para que su compaa de seguros malta su medicamento, por favor permtanos de 1 a 2 das hbiles para completar este proceso.  Los precios de los medicamentos varan con frecuencia dependiendo del Environmental consultant de dnde se surte la receta y alguna farmacias pueden ofrecer precios ms baratos.  El sitio web www.goodrx.com tiene cupones para medicamentos de Health and safety inspector. Los precios aqu no tienen en cuenta lo que podra costar con la ayuda del seguro (puede ser ms barato con su seguro), pero el sitio web puede darle el precio si no utiliz Tourist information centre manager.  - Puede imprimir el cupn correspondiente y llevarlo con su receta a la farmacia.  - Tambin puede pasar por nuestra oficina durante el horario de atencin regular y Education officer, museum una tarjeta de cupones de GoodRx.  - Si necesita que su receta se enve electrnicamente a una farmacia diferente, informe a nuestra oficina a travs de MyChart de [place] o por telfono llamando al [phone] y presione la opcin 4.

## 2023-09-11 NOTE — Progress Notes (Signed)
   Follow-Up Visit   Subjective  Sheryl Kennedy is a 78 y.o. 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 Patient here for [facility].    The following portions of the chart were reviewed this encounter and updated as appropriate: medications, allergies, medical history  Review of Systems:  No other skin or systemic complaints except as noted in HPI or Assessment and Plan.  Objective  Well appearing patient in no apparent distress; mood and affect are within normal limits.  A focused examination was performed of the following areas: Right leg  Relevant physical exam findings are noted in the Assessment and Plan.  right lateral lower leg Pink biopsy site. Left Posterior Crown Keratotic papules at the left posterior crown.  Assessment & Plan  ACTINIC DAMAGE - chronic, secondary to cumulative UV radiation exposure/sun exposure over time - diffuse scaly erythematous macules with underlying dyspigmentation - Recommend daily broad spectrum sunscreen SPF 30+ to sun-exposed areas, reapply every 2 hours as needed.  - Recommend staying in the shade or wearing long sleeves, sun glasses (UVA+UVB protection) and wide brim hats (4-inch brim around the entire circumference of the hat). - Call for new or changing lesions.   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, INCLUDING HIP right lateral lower leg Destruction of lesion  Destruction method: electrodesiccation and curettage   Informed consent: discussed and consent obtained   Timeout:  patient name, date of birth, surgical site, and procedure verified Anesthesia: the lesion was anesthetized in a standard fashion   Anesthetic:  1% lidocaine  w/ epinephrine  1-100,000 local infiltration Curettage performed in three different directions: Yes   Electrodesiccation performed over the curetted area: Yes   Final wound size (cm):  1.5 Hemostasis achieved with:  pressure, aluminum chloride and  electrodesiccation Outcome: patient tolerated procedure well with no complications   Post-procedure details: wound care instructions given   Post-procedure details comment:  Ointment and bandage applied.  Biopsy proven.  [phone].  SCC of the right crown scalp, biopsy proven. Patient has Mohs tomorrow with Dr. Corey.   HYPERTROPHIC ACTINIC KERATOSIS Left Posterior Crown Hypertrophic AKs vs SCCs. Will biopsies on follow-up since right crown came back SCC.  Actinic keratoses are precancerous spots that appear secondary to cumulative UV radiation exposure/sun exposure over time. They are chronic with expected duration over 1 year. A portion of actinic keratoses will progress to squamous cell carcinoma of the skin. It is not possible to reliably predict which spots will progress to skin cancer and so treatment is recommended to prevent development of skin cancer.  Recommend daily broad spectrum sunscreen SPF 30+ to sun-exposed areas, reapply every 2 hours as needed.  Recommend staying in the shade or wearing long sleeves, sun glasses (UVA+UVB protection) and wide brim hats (4-inch brim around the entire circumference of the hat). Call for new or changing lesions.   Return 4-6 wks, for scalp bxs.  IAndrea Kerns, CMA, am acting as scribe for Rexene Rattler, MD .   Documentation: I have reviewed the above documentation for accuracy and completeness, and I agree with the above.  Rexene Rattler, MD

## 2023-09-12 ENCOUNTER — Ambulatory Visit (INDEPENDENT_AMBULATORY_CARE_PROVIDER_SITE_OTHER): Admitting: Dermatology

## 2023-09-12 ENCOUNTER — Encounter: Payer: Self-pay | Admitting: Dermatology

## 2023-09-12 VITALS — BP 129/70 | HR 83 | Temp 98.3°F

## 2023-09-12 DIAGNOSIS — C4442 Squamous cell carcinoma of skin of scalp and neck: Secondary | ICD-10-CM | POA: Diagnosis not present

## 2023-09-12 DIAGNOSIS — C4492 Squamous cell carcinoma of skin, unspecified: Secondary | ICD-10-CM

## 2023-09-12 NOTE — Progress Notes (Signed)
 Follow-Up Visit   Subjective  Sheryl Kennedy is a 78 y.o. 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 Jackquline.   The following portions of the chart were reviewed this encounter and updated as appropriate: medications, allergies, medical history  Review of Systems:  No other skin or systemic complaints except as noted in HPI or Assessment and Plan.  Objective  Well appearing patient in no apparent distress; mood and affect are within normal limits.  A focused examination was performed of the following areas: Right crown scalp Relevant physical exam findings are noted in the Assessment and Plan.     Assessment & Plan   SQUAMOUS CELL CARCINOMA OF SKIN right crown scalp Mohs surgery  Consent obtained: written  Anticoagulation: Was the anticoagulation regimen changed prior to Mohs? No    Procedure Details: Timeout: pre-procedure verification complete Procedure Prep: patient was prepped and draped in usual sterile fashion Prep type: chlorhexidine  Biopsy accession number: [id number] Biopsy lab: [facility] Date of biopsy: 07/31/2023 Pre-Op diagnosis: squamous cell carcinoma SCC subtype: well differentiated Surgery side: right Surgical site (from skin exam): right crown scalp Pre-operative length (cm): 1.8 Pre-operative width (cm): 1.5 Indications for Mohs surgery: anatomic location where tissue conservation is critical  Micrographic Surgery Details: Post-operative length (cm): 3 Post-operative width (cm): 2.7 Number of Mohs stages: 2  Related Medications mupirocin  ointment (BACTROBAN ) 2 % Apply 1 Application topically daily.   Return in about 3 weeks (around 10/03/2023) for wound check.  LILLETTE Darice Smock, CMA, am acting as scribe for RUFUS CHRISTELLA HOLY, MD.    09/12/2023  HISTORY OF PRESENT ILLNESS  Sheryl Kennedy is seen in consultation at the request of Dr.  Jackquline for biopsy-proven Well Differentiated Squamous Cell Carcinoma on the crown of the scalp. They note that the area has been present for about 6 months increasing in size with time.  There is no history of previous treatment.  Reports no other new or changing lesions and has no other complaints today.  Medications and allergies: see patient chart.  Review of systems: Reviewed 8 systems and notable for the above skin cancer.  All other systems reviewed are unremarkable/negative, unless noted in the HPI. Past medical history, surgical history, family history, social history were also reviewed and are noted in the chart/questionnaire.    PHYSICAL EXAMINATION  General: Well-appearing, in no acute distress, alert and oriented x 4. Vitals reviewed in chart (if available).   Skin: Exam reveals a 1.8 x 1.5 cm erythematous papule and biopsy scar on the crown of scalp. There are rhytids, telangiectasias, and lentigines, consistent with photodamage.  Biopsy report(s) reviewed, confirming the diagnosis.   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 Due to location, size, histology, or recurrence and the likelihood of subclinical extension as well as the need to conserve normal surrounding tissue, the patient was deemed acceptable for Mohs micrographic surgery (MMS).  The nature and purpose of the procedure, associated benefits and risks including recurrence and scarring, possible complications such as pain, infection, and bleeding, and alternative methods of treatment if appropriate were discussed with the patient during consent. The lesion location was verified by the patient, by reviewing previous notes, pathology reports, and by photographs as well as angulation measurements if available.  Informed consent was reviewed and signed by the  patient, and timeout was performed at 10:00 AM. See op note below.  2. For the  photodamage and solar lentigines, sun protection discussed/information given on OTC sunscreens, and we recommend continued regular follow-up with primary dermatologist every 6 months or sooner for any growing, bleeding, or changing lesions. 3. Prognosis and future surveillance discussed. 4. Letter with treatment outcome sent to referring provider. 5. Pain acetaminophen /ibuprofen   MOHS MICROGRAPHIC SURGERY AND RECONSTRUCTION  Initial size:   1.8 x 1.5 cm Surgical defect/wound size: 3.0 x 2.7 cm Anesthesia:    0.33% lidocaine  with 1:200,000 epinephrine  EBL:    <5 mL Complications:  None Repair type:   Second Intention  Stages: 2  STAGE I: Anesthesia achieved with 0.5% lidocaine  with 1:200,000 epinephrine . ChloraPrep applied. 2 section(s) excised using Mohs technique (this includes total peripheral and deep tissue margin excision and evaluation with frozen sections, excised and interpreted by the same physician). The tumor was first debulked and then excised with an approx. 2 mm margin.  Hemostasis was achieved with electrocautery as needed.  The specimen was then oriented, subdivided/relaxed, inked, and processed using Mohs technique.    Frozen section analysis revealed a positive margin for atypical epithelial cells with squamous differentiation in the dermis in the deep and peripheral margin.    STAGE II: An additional 2 mm margin was excised.  Hemostasis was achieved with electrocautery as needed.  The specimen was then oriented, subdivided/relaxed, inked, and processed using Mohs technique. Evaluation of slides by the Mohs surgeon revealed clear tumor margins.   Reconstruction  Patient was notified of results and repair options were discussed, including second intention healing. After reviewing the advantages and disadvantages of each, we agreed on second intention healing as appropriate.   The surgical site was then lightly scrubbed with sterile, saline-soaked gauze.  The area was  bandaged using Vaseline ointment, non-adherent gauze, gauze pads, and tape to provide an adequate pressure dressing.   The patient tolerated the procedure well, was given detailed written and verbal wound care instructions, and was discharged in good condition.  The patient will follow-up in 3 weeks and as scheduled with primary dermatologist.  Documentation: I have reviewed the above documentation for accuracy and completeness, and I agree with the above.  RUFUS CHRISTELLA HOLY, MD

## 2023-09-12 NOTE — Patient Instructions (Signed)

## 2023-09-13 ENCOUNTER — Other Ambulatory Visit: Payer: Self-pay | Admitting: Dermatology

## 2023-09-13 DIAGNOSIS — C4492 Squamous cell carcinoma of skin, unspecified: Secondary | ICD-10-CM

## 2023-10-03 ENCOUNTER — Ambulatory Visit: Admitting: Dermatology

## 2023-10-04 ENCOUNTER — Encounter: Payer: Self-pay | Admitting: Dermatology

## 2023-10-04 ENCOUNTER — Ambulatory Visit: Admitting: Dermatology

## 2023-10-04 DIAGNOSIS — C4492 Squamous cell carcinoma of skin, unspecified: Secondary | ICD-10-CM

## 2023-10-04 DIAGNOSIS — T1490XD Injury, unspecified, subsequent encounter: Secondary | ICD-10-CM

## 2023-10-04 NOTE — Patient Instructions (Signed)

## 2023-10-04 NOTE — Progress Notes (Addendum)
 Follow Up Visit   Subjective  Sheryl Kennedy is a 78 y.o. female who presents for the following: follow up from Mohs surgery   The patient presents for follow up from Mohs surgery for a SCC on the right crown scalp, treated on 09/12/23, repaired with 2nd intention. The patient has been bandaging the wound as directed. The endorse the following concerns: none  The following portions of the chart were reviewed this encounter and updated as appropriate: medications, allergies, medical history  Review of Systems:  No other skin or systemic complaints except as noted in HPI or Assessment and Plan.  Objective  Well appearing patient in no apparent distress; mood and affect are within normal limits.  A focal examination was performed including scalp, head, face and right crown scalp. All findings within normal limits unless otherwise noted below.  Dried wound with widening     Relevant physical exam findings are noted in the Assessment and Plan.    Assessment & Plan   Healing Wound s/p Mohs for SCC, treated on 09/12/23, repaired with 2nd intention - Very dry, inadequate amounts of emollients applied by patients.  - Recommend debriding wound, and using donated allograft as well as Bolster dressing to ensure moist environment for wound healing.  - Discussed that scars take up to 12 months to mature from the date of surgery - Recommend SPF 30+ to scar daily to prevent purple color from UV exposure during scar maturation process - Discussed that erythema and raised appearance of scar will fade over the next 4-6 months - OK to start scar massage at 4-6 weeks post-op - Can consider silicone based products for scar healing starting at 6 weeks post-op - Ok to continue ointment daily to wound under a bandage for another week  Wound size  3.0 x 2.7 cm  Wound size after purse string 2.3 x 2.0 cm  Simple Repair Purse String Suture 3-0 Vicryl  The surgical wound was then cleaned, prepped,  and re-anesthetized as above.  Subcutaneous and epidermal tissues were approximated with the above sutures using guiding sutures/purse-string technique. The surgical site was then lightly scrubbed with sterile, saline-soaked gauze.  The patient tolerated the procedure well, was given detailed written and verbal wound care instructions, and was discharged in good condition.   Wound Bed Preparation  The patient is being prepared for an allograft. The wound bed is thoroughly assessed and prepared to optimize graft take and healing. The necrotic tissue is debrided to the dermis with a curette to create a clean, viable base, and any infected tissue is removed to reduce the risk of graft failure. Hemostasis is achieved to prevent bleeding complications during the grafting process. The wound edges are carefully undermined, and the underlying tissues are inspected for adequate vascularity to support the graft. The wound is then thoroughly irrigated, and the area is prepped with antiseptic solutions to minimize the risk of infection. Once the wound bed is adequately prepared, it is covered with a moist dressing in preparation for the skin graft placement.  Donated Allograft placed today [phone] [facility]   Bolster dressing with Xeroform sutured into place.   The patient will follow-up: 7-10 days.  HISTORY OF SQUAMOUS CELL CARCINOMA OF THE SKIN - No evidence of recurrence today - Recommend regular full body skin exams - Recommend daily broad spectrum sunscreen SPF 30+ to sun-exposed areas, reapply every 2 hours as needed.  - Call if any new or changing lesions are noted between  office visits      No follow-ups on file.  I, Darice Smock, CMA, am acting as scribe for RUFUS CHRISTELLA HOLY, MD.   Documentation: I have reviewed the above documentation for accuracy and completeness, and I agree with the above.  RUFUS CHRISTELLA HOLY, MD

## 2023-10-05 ENCOUNTER — Encounter: Payer: Self-pay | Admitting: Dermatology

## 2023-10-06 ENCOUNTER — Emergency Department

## 2023-10-06 ENCOUNTER — Inpatient Hospital Stay
Admission: EM | Admit: 2023-10-06 | Discharge: 2023-10-08 | DRG: 372 | Disposition: A | Discharge status: Home Health | Attending: Internal Medicine | Admitting: Internal Medicine

## 2023-10-06 ENCOUNTER — Other Ambulatory Visit: Payer: Self-pay

## 2023-10-06 ENCOUNTER — Inpatient Hospital Stay

## 2023-10-06 ENCOUNTER — Encounter: Payer: Self-pay | Admitting: Emergency Medicine

## 2023-10-06 DIAGNOSIS — Z886 Allergy status to analgesic agent status: Secondary | ICD-10-CM

## 2023-10-06 DIAGNOSIS — Z85828 Personal history of other malignant neoplasm of skin: Secondary | ICD-10-CM

## 2023-10-06 DIAGNOSIS — R54 Age-related physical debility: Secondary | ICD-10-CM | POA: Diagnosis present

## 2023-10-06 DIAGNOSIS — Z96651 Presence of right artificial knee joint: Secondary | ICD-10-CM | POA: Diagnosis present

## 2023-10-06 DIAGNOSIS — R262 Difficulty in walking, not elsewhere classified: Secondary | ICD-10-CM | POA: Diagnosis present

## 2023-10-06 DIAGNOSIS — A0472 Enterocolitis due to Clostridium difficile, not specified as recurrent: Secondary | ICD-10-CM

## 2023-10-06 DIAGNOSIS — Z79899 Other long term (current) drug therapy: Secondary | ICD-10-CM | POA: Diagnosis not present

## 2023-10-06 DIAGNOSIS — N1832 Chronic kidney disease, stage 3b: Secondary | ICD-10-CM | POA: Diagnosis present

## 2023-10-06 DIAGNOSIS — Z79890 Hormone replacement therapy (postmenopausal): Secondary | ICD-10-CM | POA: Diagnosis not present

## 2023-10-06 DIAGNOSIS — M25551 Pain in right hip: Secondary | ICD-10-CM | POA: Diagnosis present

## 2023-10-06 DIAGNOSIS — I10 Essential (primary) hypertension: Secondary | ICD-10-CM | POA: Diagnosis present

## 2023-10-06 DIAGNOSIS — Z8249 Family history of ischemic heart disease and other diseases of the circulatory system: Secondary | ICD-10-CM | POA: Diagnosis not present

## 2023-10-06 DIAGNOSIS — D72829 Elevated white blood cell count, unspecified: Secondary | ICD-10-CM | POA: Diagnosis not present

## 2023-10-06 DIAGNOSIS — Z800 Family history of malignant neoplasm of digestive organs: Secondary | ICD-10-CM

## 2023-10-06 DIAGNOSIS — Z7982 Long term (current) use of aspirin: Secondary | ICD-10-CM

## 2023-10-06 DIAGNOSIS — M109 Gout, unspecified: Secondary | ICD-10-CM | POA: Diagnosis present

## 2023-10-06 DIAGNOSIS — A0471 Enterocolitis due to Clostridium difficile, recurrent: Secondary | ICD-10-CM | POA: Diagnosis present

## 2023-10-06 DIAGNOSIS — R197 Diarrhea, unspecified: Secondary | ICD-10-CM

## 2023-10-06 DIAGNOSIS — Z882 Allergy status to sulfonamides status: Secondary | ICD-10-CM

## 2023-10-06 DIAGNOSIS — N179 Acute kidney failure, unspecified: Principal | ICD-10-CM | POA: Diagnosis present

## 2023-10-06 DIAGNOSIS — E785 Hyperlipidemia, unspecified: Secondary | ICD-10-CM | POA: Diagnosis present

## 2023-10-06 DIAGNOSIS — Z888 Allergy status to other drugs, medicaments and biological substances status: Secondary | ICD-10-CM | POA: Diagnosis not present

## 2023-10-06 DIAGNOSIS — E039 Hypothyroidism, unspecified: Secondary | ICD-10-CM | POA: Diagnosis present

## 2023-10-06 DIAGNOSIS — W19XXXA Unspecified fall, initial encounter: Secondary | ICD-10-CM | POA: Diagnosis present

## 2023-10-06 DIAGNOSIS — R42 Dizziness and giddiness: Secondary | ICD-10-CM

## 2023-10-06 DIAGNOSIS — A0811 Acute gastroenteropathy due to Norwalk agent: Secondary | ICD-10-CM | POA: Diagnosis present

## 2023-10-06 DIAGNOSIS — Z9889 Other specified postprocedural states: Secondary | ICD-10-CM

## 2023-10-06 DIAGNOSIS — I129 Hypertensive chronic kidney disease with stage 1 through stage 4 chronic kidney disease, or unspecified chronic kidney disease: Secondary | ICD-10-CM | POA: Diagnosis present

## 2023-10-06 DIAGNOSIS — T502X5A Adverse effect of carbonic-anhydrase inhibitors, benzothiadiazides and other diuretics, initial encounter: Secondary | ICD-10-CM | POA: Diagnosis present

## 2023-10-06 DIAGNOSIS — E871 Hypo-osmolality and hyponatremia: Secondary | ICD-10-CM | POA: Diagnosis present

## 2023-10-06 DIAGNOSIS — Z8261 Family history of arthritis: Secondary | ICD-10-CM

## 2023-10-06 LAB — URINALYSIS, COMPLETE (UACMP) WITH MICROSCOPIC
Bacteria, UA: NONE SEEN
Bilirubin Urine: NEGATIVE
Glucose, UA: NEGATIVE mg/dL
Hgb urine dipstick: NEGATIVE
Ketones, ur: NEGATIVE mg/dL
Leukocytes,Ua: NEGATIVE
Nitrite: NEGATIVE
Protein, ur: NEGATIVE mg/dL
Specific Gravity, Urine: 1.006 (ref 1.005–1.030)
pH: 5 (ref 5.0–8.0)

## 2023-10-06 LAB — COMPREHENSIVE METABOLIC PANEL WITH GFR
ALT: 25 U/L (ref 0–44)
AST: 45 U/L — ABNORMAL HIGH (ref 15–41)
Albumin: 3.7 g/dL (ref 3.5–5.0)
Alkaline Phosphatase: 65 U/L (ref 38–126)
Anion gap: 13 (ref 5–15)
BUN: 45 mg/dL — ABNORMAL HIGH (ref 8–23)
CO2: 27 mmol/L (ref 22–32)
Calcium: 10.1 mg/dL (ref 8.9–10.3)
Chloride: 85 mmol/L — ABNORMAL LOW (ref 98–111)
Creatinine, Ser: 2.3 mg/dL — ABNORMAL HIGH (ref 0.44–1.00)
GFR, Estimated: 21 mL/min — ABNORMAL LOW (ref >60)
Glucose, Bld: 147 mg/dL — ABNORMAL HIGH (ref 70–99)
Potassium: 4.2 mmol/L (ref 3.5–5.1)
Sodium: 125 mmol/L — ABNORMAL LOW (ref 135–145)
Total Bilirubin: 0.7 mg/dL (ref 0.0–1.2)
Total Protein: 6.3 g/dL — ABNORMAL LOW (ref 6.5–8.1)

## 2023-10-06 LAB — CBC
HCT: 38.7 % (ref 36.0–46.0)
Hemoglobin: 13.4 g/dL (ref 12.0–15.0)
MCH: 31.7 pg (ref 26.0–34.0)
MCHC: 34.6 g/dL (ref 30.0–36.0)
MCV: 91.5 fL (ref 80.0–100.0)
Platelets: 269 K/uL (ref 150–400)
RBC: 4.23 MIL/uL (ref 3.87–5.11)
RDW: 14.6 % (ref 11.5–15.5)
WBC: 14.8 K/uL — ABNORMAL HIGH (ref 4.0–10.5)
nRBC: 0 % (ref 0.0–0.2)

## 2023-10-06 LAB — OSMOLALITY: Osmolality: 276 mosm/kg (ref 275–295)

## 2023-10-06 LAB — SODIUM, URINE, RANDOM: Sodium, Ur: 71 mmol/L

## 2023-10-06 LAB — OSMOLALITY, URINE: Osmolality, Ur: 236 mosm/kg — ABNORMAL LOW (ref 300–900)

## 2023-10-06 MED ORDER — LACTATED RINGERS IV SOLN
INTRAVENOUS | Status: AC
Start: 2023-10-06 — End: 2023-10-07

## 2023-10-06 MED ORDER — FIDAXOMICIN 200 MG PO TABS
200.0000 mg | ORAL_TABLET | Freq: Two times a day (BID) | ORAL | Status: DC
  Administered 2023-10-06 – 2023-10-08 (×4): 200 mg via ORAL
  Filled 2023-10-06 (×5): qty 1

## 2023-10-06 MED ORDER — ACETAMINOPHEN 650 MG RE SUPP: 650.0000 mg | Freq: Four times a day (QID) | RECTAL | Status: DC | PRN

## 2023-10-06 MED ORDER — FIDAXOMICIN 200 MG PO TABS: 200.0000 mg | ORAL_TABLET | Freq: Two times a day (BID) | ORAL | Status: DC

## 2023-10-06 MED ORDER — FIDAXOMICIN 200 MG PO TABS: 200.0000 mg | ORAL_TABLET | ORAL | Status: DC

## 2023-10-06 MED ORDER — HEPARIN SODIUM (PORCINE) 5000 UNIT/ML IJ SOLN
5000.0000 [IU] | Freq: Three times a day (TID) | INTRAMUSCULAR | Status: DC
  Administered 2023-10-06 – 2023-10-08 (×5): 5000 [IU] via SUBCUTANEOUS
  Filled 2023-10-06 (×5): qty 1

## 2023-10-06 MED ORDER — ONDANSETRON HCL 4 MG PO TABS: 4.0000 mg | ORAL_TABLET | Freq: Four times a day (QID) | ORAL | Status: DC | PRN

## 2023-10-06 MED ORDER — HYDRALAZINE HCL 20 MG/ML IJ SOLN: 5.0000 mg | Freq: Four times a day (QID) | INTRAMUSCULAR | Status: DC | PRN

## 2023-10-06 MED ORDER — SENNOSIDES-DOCUSATE SODIUM 8.6-50 MG PO TABS: 1.0000 | ORAL_TABLET | Freq: Every evening | ORAL | Status: DC | PRN

## 2023-10-06 MED ORDER — SODIUM CHLORIDE 0.9 % IV BOLUS
500.0000 mL | Freq: Once | INTRAVENOUS | Status: AC
  Administered 2023-10-06: 500 mL via INTRAVENOUS

## 2023-10-06 MED ORDER — ACETAMINOPHEN 325 MG PO TABS
650.0000 mg | ORAL_TABLET | Freq: Four times a day (QID) | ORAL | Status: DC | PRN
  Administered 2023-10-07 (×2): 650 mg via ORAL
  Filled 2023-10-06 (×2): qty 2

## 2023-10-06 MED ORDER — ONDANSETRON HCL 4 MG/2ML IJ SOLN: 4.0000 mg | Freq: Four times a day (QID) | INTRAMUSCULAR | Status: DC | PRN

## 2023-10-06 NOTE — Assessment & Plan Note (Addendum)
 UA is negative for leukocytes and nitrates Likely secondary to C. difficile colitis and norovirus -Improved today - Continue to monitor

## 2023-10-06 NOTE — Assessment & Plan Note (Addendum)
 Likely due to poor p.o. intake and also taking HCTZ at home.  Seems chronic with gradual worsening, sodium with some improvement to 128 today Hold home hydrochlorothiazide on admission,-should be discontinued -Hyponatremia labs with normal serum osmolality, urine sodium of 70 and and mild hypoosmolar urine. - Monitor sodium

## 2023-10-06 NOTE — H&P (Addendum)
 History and Physical   Sheryl Kennedy FMW:[id number] DOB: April 06, 1945 DOA: 10/06/2023  PCP: Auston Reyes [name], MD Outpatient Specialists: Dr. Corey, dermatology Patient coming from: Home  I have personally briefly reviewed patient's old medical records in [facility] EMR.  Chief Concern: Dizziness  HPI: Ms. 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  Vitals in the ED showed t of 98.3, rr 16, heart rate 87, blood pressure 101/73, SpO2 100% on room air.  Serum sodium is 125, potassium 4.2, chloride 85, bicarb 27, BUN of 45, sCr 2.30, eGFR 21, nonfasting glucose 147, WBC 14.8, hemoglobin 13.4, platelets of 269.  UA was negative for leukocytes and nitrates.  ED treatment: Sodium chloride  500 mL liter bolus. --------------------------------- At bedside, patient able to tell me her first and last name, age, location, current calendar year.  Patient reports that she has been dealing with chronic diarrhea since late June/early July.  She reports that in June she had a urinary tract infection and was prescribed an antibiotic.  Ever since then, she has been having diarrhea.  She has been diagnosed with C. difficile colitis twice and up both times, she was compliant with the oral vancomycin that was prescribed.  She reports the diarrhea returned about a week ago.  She reports no blood, black stool, nausea, vomiting, chest pain, shortness of breath, dysuria, hematuria.  She reports yesterday, she was so weak, she fell on her right hip.  She reports she has been having difficulty walking since then.  She reports poor p.o. intake due to generalized weakness.  Patient further reports that she just received labs from outpatient GI, stating that she  is again positive for C. difficile bacteria.  She  endorses subjective fever and chills over the last few days.  She did not check her temperature at home.  Social history: She lives at home.  She denies tobacco, EtOH, recreational drug use.  She is retired.  ROS: Constitutional: no weight change, + subjective fever, + chills  ENT/Mouth: no sore throat, no rhinorrhea Eyes: no eye pain, no vision changes Cardiovascular: no chest pain, no dyspnea,  no edema, no palpitations Respiratory: no cough, no sputum, no wheezing Gastrointestinal: no nausea, no vomiting, + diarrhea, no constipation Genitourinary: no urinary incontinence, no dysuria, no hematuria Musculoskeletal: no arthralgias, no myalgias Skin: no skin lesions, no pruritus, Neuro: + weakness, no loss of consciousness, no syncope Psych: no anxiety, no depression, + decrease appetite Heme/Lymph: no bruising, no bleeding  ED Course: Discussed with EDP, patient requiring hospitalization for chief concerns of acute kidney injury and hyponatremia.  Assessment/Plan  Principal Problem:   AKI (acute kidney injury) Active Problems:   Hyponatremia   C. difficile colitis   Chronic kidney disease, stage 3b (HCC)   Leukocytosis   Benign essential hypertension   Gout   Hyperlipidemia, unspecified   Right hip pain   S/P Mohs surgery for basal cell carcinoma   Assessment and Plan:  * AKI (acute kidney injury) Suspect prerenal secondary to GI loss and poor p.o. intake Status post sodium chloride  500 mL bolus per EDP On admission I ordered LR infusion at 125 mL/h, 1 day ordered Avoid scheduled nephrotoxic agent Recheck BMP in a.m.  C. difficile colitis Via outpatient GI results in Care Everywhere This is patient's second recurrence. Patient states the first two episodes  of c diff colitis, she was prescribed and completed vancomycin PO, as prescribed Enteric precaution intiated Fidaxomicin 200 mg BID PO initiated on admissions, 5 days followed by 15 day taper  Hyponatremia Status  post sodium chloride  500 mL liter bolus per EDP On admission I initiated LR infusion at 125 mL/h, 1 day ordered Hold home hydrochlorothiazide on admission, a.m. team to resume when benefits outweigh the risk Serum osmolality, random urine sodium, urine osmolarity have been ordered and pending at this time Recheck BMP in the a.m.  Leukocytosis Etiology workup in progress, check portable chest x-ray UA is negative for leukocytes and nitrates  Addendum: Secondary to C. difficile colitis, treatment per above  Benign essential hypertension Hydralazine  5 mg IV every 6 hours as needed for SBP greater 170, 5 days ordered  S/P Mohs surgery for basal cell carcinoma For squamous cell carcinoma Sutures are in tact and surrounding dermis appears clean Continue outpatient follow-up with dermatology as appropriate  Right hip pain Patient able to weakly flex and extend her right hips and knees. She reports difficulty ambulating Right hip x-ray ordered on admission PT, OT for tomorrow  Chart reviewed.   DVT prophylaxis: Heparin  5000 units subcutaneous every 8 hours Code Status: Full code Diet: Regular diet Family Communication: Updated daughter, Consuelo at bedside with patient's permission Disposition Plan: Pending clinical course Consults called: PT, OT Admission status: Telemetry medical, inpatient  Past Medical History:  Diagnosis Date   Arthritis    Cancer (HCC) 08/2012   skin right anterior lower leg, squamous cell   DSAP (disseminated superficial actinic porokeratosis)    Gout    Hx of seasonal allergies    Hyperlipidemia    Hypertension    SCC (squamous cell carcinoma) 07/31/2023   superfical infiltration, R crown scalp, referral to Dr. Corey for [facility]   SCC (squamous cell carcinoma) 07/31/2023   R lateral lower leg, EDC 09/11/23   Squamous cell carcinoma of skin    R anterior lower leg   Squamous cell carcinoma of skin 12/12/2022   SCC/KA type. Left forearm. Excision 02/01/2023    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 complications    Past Surgical History:  Procedure Laterality Date   BACK SURGERY  2008   no metal in back   BREAST BIOPSY Right 09/13/2010   core/neg   BREAST BIOPSY Right 12/09/2015   us  biopsy/neg   BREAST SURGERY Right 2012   CATARACT EXTRACTION W/PHACO Left 04/25/2022   Procedure: CATARACT EXTRACTION PHACO AND INTRAOCULAR LENS PLACEMENT (IOC) LEFT 3.42 00:25.2;  Surgeon: Myrna Adine Anes, MD;  Location: [facility];  Service: Ophthalmology;  Laterality: Left;   CATARACT EXTRACTION W/PHACO Right 05/23/2022   Procedure: CATARACT EXTRACTION PHACO AND INTRAOCULAR LENS PLACEMENT (IOC) RIGHT 3.84 00:28.1;  Surgeon: Myrna Adine Anes, MD;  Location: [facility];  Service: Ophthalmology;  Laterality: Right;   CHONDROPLASTY Left 01/22/2016   Procedure: arthroscopic medial AND LATERAL CHONDROPLASTY;  Surgeon: Lynwood SHAUNNA Hue, MD;  Location: [facility];  Service: Orthopedics;  Laterality: Left;   COLONOSCOPY  2011   COLONOSCOPY WITH PROPOFOL  N/A 04/14/2015   Procedure: COLONOSCOPY WITH PROPOFOL ;  Surgeon: Gladis RAYMOND Mariner, MD;  Location: [facility];  Service: Endoscopy;  Laterality: N/A;   CYST REMOVAL HAND Left 2012   JOINT REPLACEMENT Right 09/30/2013   TOTAL KNEE REPLACEMENT, DR. HUE, [facility]   KNEE ARTHROSCOPY WITH MEDIAL MENISECTOMY Left 01/22/2016   Procedure: KNEE ARTHROSCOPY WITH MEDIAL AND LATERAL MENISECTOMY;  Surgeon: Lynwood SHAUNNA Hue, MD;  Location: [facility];  Service: Orthopedics;  Laterality: Left;   KNEE SURGERY  1997   LEFT HEART CATH AND CORONARY ANGIOGRAPHY N/A 10/25/2021   Procedure: LEFT HEART CATH AND CORONARY ANGIOGRAPHY;  Surgeon: Mady Bruckner, MD;  Location: [facility] INVASIVE CV LAB;  Service: Cardiovascular;  Laterality: N/A;   Stab pheblectomy  Left 2013   TONSILLECTOMY     VEIN CLOSURE Bilateral 2012   Social History:  reports that she has never smoked. She has never used smokeless tobacco. She reports  current alcohol use. She reports that she does not use drugs.  Allergies  Allergen Reactions   Naproxen Swelling    In hands and joints   Pseudoephedrine Rash and Other (See Comments)    Rhinitis and sneezing Rhinitis and sneezing Rhinitis and sneezing   Shellfish Allergy Other (See Comments)    Joint swelling, and gout     Sulfa Antibiotics Rash   Family History  Problem Relation Age of Onset   Heart failure Mother    Heart disease Mother    Arthritis Mother    Colon cancer Paternal Grandmother    Breast cancer Neg Hx    Family history: Family history reviewed and not pertinent.  Prior to Admission medications   Medication Sig Start Date End Date Taking? Authorizing Provider  allopurinol  (ZYLOPRIM ) 300 MG tablet Take 300 mg by mouth daily.    [provider]  aspirin  325 MG tablet Take 325 mg by mouth daily.    [provider]  Calcium Carbonate-Vit D-Min (CALCIUM 600+D PLUS MINERALS PO) Take 1 tablet by mouth daily.    [provider]  Cholecalciferol (VITAMIN D3) 25 MCG (1000 UT) CAPS Take by mouth.    [provider]  clobetasol  (TEMOVATE ) 0.05 % external solution APPLY TOPICALLY TO SCALP IN THE MORNING AFTER SHAMPOO FOR ITCHY SCALP 01/05/22   Jackquline Sawyer, MD  colchicine  0.6 MG tablet Take 0.5 tablets (0.3 mg total) by mouth daily for 7 days. 10/27/21 11/03/21  Laurita Pillion, MD  Cyanocobalamin  1000 MCG TBCR Take 1 tablet by mouth daily.     [provider]  docusate sodium  (COLACE) 100 MG capsule Take 100 mg by mouth at bedtime.    [provider]  fexofenadine (ALLEGRA) 180 MG tablet Take 180 mg by mouth daily as needed for allergies.    [provider]  Fluocinolone  Acetonide Body 0.01 % OIL Apply topically to itchy scaly spot at scalp cover with shower cap qhs 03/21/23   Stewart, Tara, MD  fluticasone (FLONASE) 50 MCG/ACT nasal spray Place 1 spray into both nostrils daily as needed for allergies or rhinitis.     [provider]  ketoconazole  (NIZORAL ) 2 % shampoo WASH SCALP 2-3 TIMES WEEKLY, LET SIT 5 MINUTES AND RINSE 08/01/22   Jackquline Sawyer, MD  levothyroxine  (SYNTHROID ) 50 MCG tablet Take 50 mcg by mouth daily before breakfast.    [provider]  metoprolol succinate (TOPROL-XL) 25 MG 24 hr tablet Take 25 mg by mouth daily.  04/26/13   [provider]  mupirocin  ointment (BACTROBAN ) 2 % APPLY 1 APPLICATION TOPICALLY DAILY 09/14/23   Claudene Lehmann, MD  Nutritional Supplements (ESTROVEN PM PO) Take 0.5 tablets by mouth at bedtime. For hotflashes    [provider]  potassium chloride  SA (K-DUR,KLOR-CON ) 20 MEQ tablet Take 20 mEq by mouth 3 (three) times daily.  03/28/13   [provider]  simvastatin  (ZOCOR ) 40 MG tablet Take 20 mg by mouth daily at 6  PM.  04/08/13   [provider]  spironolactone-hydrochlorothiazide (ALDACTAZIDE) 25-25 MG tablet Take 1 tablet by mouth daily.    [provider]  torsemide (DEMADEX) 20 MG tablet Take 20 mg by mouth daily.  04/26/13   [provider]  traMADol  (ULTRAM ) 50 MG tablet Take 50 mg by mouth 4 (four) times daily.    [provider]  triamcinolone  cream (KENALOG ) 0.1 % Apply to affected areas rash on body once to twice daily until improved. Avoid face, groin, axilla. 04/13/22   Jackquline Sawyer, MD   Physical Exam: Vitals:   10/06/23 1324 10/06/23 1325  BP: 101/73   Pulse: 87   Resp: 16   Temp: 98.3 F (36.8 C)   TempSrc: Oral   SpO2: 100%   Weight:  61.5 kg  Height:  5' (1.524 m)   Constitutional: appears frail, weak Eyes: PERRL, lids and conjunctivae normal ENMT: Mucous membranes are moist. Posterior pharynx clear of any exudate or lesions. Age-appropriate dentition. Hearing appropriate Neck: normal, supple, no masses, no thyromegaly Respiratory: clear to auscultation bilaterally, no wheezing, no crackles. Normal respiratory effort. No accessory muscle use.   Cardiovascular: Regular rate and rhythm, no murmurs / rubs / gallops. No extremity edema. 2+ pedal pulses. No carotid bruits.  Abdomen: no tenderness, no masses palpated, no hepatosplenomegaly.  Increased bowel sounds.  Musculoskeletal: no clubbing / cyanosis. No joint deformity upper and lower extremities. Good ROM, no contractures, no atrophy. Normal muscle tone.  Skin: + s/p MOHs surgery on the crown of her head, sutures are in place and appears well healed Neurologic: Sensation intact. Strength 5/5 in all 4.  Psychiatric: Normal judgment and insight. Alert and oriented x 3.  Depressed mood.   EKG: independently reviewed, showing sinus rhythm with rate of 83, QTc 408  Chest x-ray on Admission: I personally reviewed and I agree with radiologist reading as below.  DG Chest Port 1 View Result Date: 10/06/2023 CLINICAL DATA:  Leukocytosis EXAM: PORTABLE CHEST 1 VIEW COMPARISON:  Chest CT 06/16/2020 FINDINGS: The heart size and mediastinal contours are within normal limits. Both lungs are clear. The visualized skeletal structures are unremarkable. Aortic atherosclerosis. IMPRESSION: No active disease. Electronically Signed   By: Luke Bun M.D.   On: 10/06/2023 16:35   CT HEAD WO CONTRAST Result Date: 10/06/2023 CLINICAL DATA:  Provided history: Ataxia, acute, traumatic (Ped 0-17y) Dizziness leading to fall last night. EXAM: CT HEAD WITHOUT CONTRAST TECHNIQUE: Contiguous axial images were obtained from the base of the skull through the vertex without intravenous contrast. RADIATION DOSE REDUCTION: This exam was performed according to the departmental dose-optimization program which includes automated exposure control, adjustment of the mA and/or kV according to patient size and/or use of iterative reconstruction technique. COMPARISON:  Head CT 09/11/2018 FINDINGS: Brain: No intracranial hemorrhage, mass effect, or midline shift. Brain volume is normal for age. No hydrocephalus. The basilar cisterns  are patent. Mild periventricular chronic small vessel ischemia, normal for age. No evidence of territorial infarct or acute ischemia. No extra-axial or intracranial fluid collection. Vascular: Atherosclerosis of skullbase vasculature without hyperdense vessel or abnormal calcification. Skull: No fracture or focal lesion. Sinuses/Orbits: Paranasal sinuses and mastoid air cells are clear. The visualized orbits are unremarkable. Other: Frontal scalp laceration. IMPRESSION: Frontal scalp laceration. No acute intracranial abnormality. No skull fracture. Electronically Signed   By: Andrea Gasman M.D.   On: 10/06/2023 16:00   CT Cervical Spine Wo Contrast Result Date: 10/06/2023 CLINICAL DATA:  Provided history: Ataxia, cervical  trauma Dizziness leading to fall last night. EXAM: CT CERVICAL SPINE WITHOUT CONTRAST TECHNIQUE: Multidetector CT imaging of the cervical spine was performed without intravenous contrast. Multiplanar CT image reconstructions were also generated. RADIATION DOSE REDUCTION: This exam was performed according to the departmental dose-optimization program which includes automated exposure control, adjustment of the mA and/or kV according to patient size and/or use of iterative reconstruction technique. COMPARISON:  Cervical spine MRI 08/23/2021 FINDINGS: Alignment: Straightening of normal lordosis. No traumatic subluxation. Chronic grade 1 anterolisthesis of C3 on C4. Skull base and vertebrae: No acute fracture. Vertebral body heights are maintained. The dens and skull base are intact. Degenerative pannus at C1-C2. Soft tissues and spinal canal: No prevertebral fluid or swelling. No visible canal hematoma. Disc levels: Moderate to advanced multilevel degenerative disc disease and facet hypertrophy. Upper chest: No acute findings. Other: None. IMPRESSION: 1. No acute fracture or traumatic subluxation of the cervical spine. 2. Moderate-advanced multilevel degenerative disc disease and facet  hypertrophy. Electronically Signed   By: Andrea Gasman M.D.   On: 10/06/2023 15:56    Labs on Admission: I have personally reviewed following labs  CBC: Recent Labs  Lab 10/06/23 1335  WBC 14.8*  HGB 13.4  HCT 38.7  MCV 91.5  PLT 269   Basic Metabolic Panel: Recent Labs  Lab 10/06/23 1335  NA 125*  K 4.2  CL 85*  CO2 27  GLUCOSE 147*  BUN 45*  CREATININE 2.30*  CALCIUM 10.1   GFR: Estimated Creatinine Clearance: 16.8 mL/min (A) (by C-G formula based on SCr of 2.3 mg/dL (H)). Liver Function Tests: Recent Labs  Lab 10/06/23 1335  AST 45*  ALT 25  ALKPHOS 65  BILITOT 0.7  PROT 6.3*  ALBUMIN 3.7   Urine analysis:    Component Value Date/Time   COLORURINE STRAW (A) 10/06/2023 1429   APPEARANCEUR CLEAR (A) 10/06/2023 1429   APPEARANCEUR Clear 09/18/2013 0919   LABSPEC 1.006 10/06/2023 1429   LABSPEC 1.006 09/18/2013 0919   PHURINE 5.0 10/06/2023 1429   GLUCOSEU NEGATIVE 10/06/2023 1429   GLUCOSEU Negative 09/18/2013 0919   HGBUR NEGATIVE 10/06/2023 1429   BILIRUBINUR NEGATIVE 10/06/2023 1429   BILIRUBINUR Negative 09/18/2013 0919   KETONESUR NEGATIVE 10/06/2023 1429   PROTEINUR NEGATIVE 10/06/2023 1429   NITRITE NEGATIVE 10/06/2023 1429   LEUKOCYTESUR NEGATIVE 10/06/2023 1429   LEUKOCYTESUR Trace 09/18/2013 0919   This document was prepared using Dragon Voice Recognition software and may include unintentional dictation errors.  Dr. Sherre [facility]  If 7PM-7AM, please contact overnight-coverage provider If 7AM-7PM, please contact day attending provider [email or web address]  10/06/2023, 4:57 PM

## 2023-10-06 NOTE — Assessment & Plan Note (Addendum)
 Patient able to weakly flex and extend her right hips and knees. She reports difficulty ambulating Right hip x-ray with no acute fracture or dislocation PT, OT evaluation-recommending home health

## 2023-10-06 NOTE — ED Triage Notes (Signed)
 Pt reports falling last night from dizziness. Pt reports feeling dizzy for a few days. Also reports chronic diarrhea. States she hit back of head on a table. Also had skin cancer removed last week at top of head.

## 2023-10-06 NOTE — ED Notes (Signed)
 Patient transported to X-ray

## 2023-10-06 NOTE — Hospital Course (Addendum)
 Ms. 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  He was also having recurrence of diarrhea.  Patient with history of chronic diarrhea since early July,She has been diagnosed with C. difficile colitis twice and up both times, she was compliant with the oral vancomycin that was prescribed.   Vitals in the ED showed t of 98.3, rr 16, heart rate 87, blood pressure 101/73, SpO2 100% on room air.  Serum sodium is 125, potassium 4.2, chloride 85, bicarb 27, BUN of 45, sCr 2.30, eGFR 21, nonfasting glucose 147, WBC 14.8, hemoglobin 13.4, platelets of 269.  UA was negative for leukocytes and nitrates.  ED treatment: Sodium chloride  500 mL liter bolus. Patient was started on fidaxomicin with a taper for concern of recurrence of C. difficile colitis.  10/4: Vital stable, leukocytosis resolved, sodium of 128, creatinine with some improvement to 1.58-it was 1.5 in July 2025, hyponatremia labs with normal serum osmolality, low urine osmolality and urine sodium of 71.  Home HCTZ was held and should not continue on discharge. C. difficile positive for antigen and negative for toxin with positive PCR.  GI pathogen panel positive for norovirus.  PT is recommending home health-patient declined at this time.  10/5: Remained hemodynamically stable.  Diarrhea has been resolved.  Leukocytosis resolved.  Sodium improving and currently at 130.  Patient really wants to go home.  Having difficulty getting her Deficit due to higher cost, she received 3 days supply with payment and need to fill out patient assistance program application to get rest of the days with help.  We also held her combination pill of spironolactone and HCTZ, she should not be taking HCTZ due to hyponatremia, she was asked to discuss with her primary  care provider or cardiologist and they can resume spironolactone when appropriate.  Patient will continue the rest of her home medications and need to have a close follow-up with her providers for further assistance.

## 2023-10-06 NOTE — ED Provider Notes (Signed)
 [facility] Provider Note    Event Date/Time   First MD Initiated Contact with Patient 10/06/23 1355     (approximate)   History   Fall  Pt reports falling last night from dizziness. Pt reports feeling dizzy for a few days. Also reports chronic diarrhea. States she hit back of head on a table. Also had skin cancer removed last week at top of head.    HPI Sheryl Kennedy is a 78 y.o. female PMH CKD, hyperlipidemia, hypertension presents for evaluation after a fall - Patient is felt somewhat lightheaded over the past 2-3 days.  Has noted ongoing issues with diarrhea recently, nonblack/nonbloody.  Is being followed in the outpatient setting for this, had stool testing yesterday.  No fevers, no significant abdominal pain.  No urinary symptoms. - Has been feeling balance however.  Due to this, fell backward and hit her head on a table last night.  No LOC, not on thinners.  Has been ambulatory since then.       Physical Exam   Triage Vital Signs: ED Triage Vitals  Encounter Vitals Group     BP 10/06/23 1324 101/73     Girls Systolic BP Percentile --      Girls Diastolic BP Percentile --      Boys Systolic BP Percentile --      Boys Diastolic BP Percentile --      Pulse Rate 10/06/23 1324 87     Resp 10/06/23 1324 16     Temp 10/06/23 1324 98.3 F (36.8 C)     Temp Source 10/06/23 1324 Oral     SpO2 10/06/23 1324 100 %     Weight 10/06/23 1325 135 lb 9.3 oz (61.5 kg)     Height 10/06/23 1325 5' (1.524 m)     Head Circumference --      Peak Flow --      Pain Score 10/06/23 1324 0     Pain Loc --      Pain Education --      Exclude from Growth Chart --     Most recent vital signs: Vitals:   10/06/23 1324  BP: 101/73  Pulse: 87  Resp: 16  Temp: 98.3 F (36.8 C)  SpO2: 100%     General: Awake, no distress.  CV:  Good peripheral perfusion. RRR, RP 2+ Resp:  Normal effort. CTAB Abd:  No distention. Nontender to deep palpation  throughout Neuro:  Aox4, CN II-XII intact, FNF wnl, finger taps fast b/l, 5/5 strength in bilateral finger extension/grip, arm flexion/extension, EHL/FHL. BUE AG 10+ sec no drift, BLE AG 5+ sec no drift. +unsteady gait SILT.     ED Results / Procedures / Treatments   Labs (all labs ordered are listed, but only abnormal results are displayed) Labs Reviewed  COMPREHENSIVE METABOLIC PANEL WITH GFR - Abnormal; Notable for the following components:      Result Value   Sodium 125 (*)    Chloride 85 (*)    Glucose, Bld 147 (*)    BUN 45 (*)    Creatinine, Ser 2.30 (*)    Total Protein 6.3 (*)    AST 45 (*)    GFR, Estimated 21 (*)    All other components within normal limits  CBC - Abnormal; Notable for the following components:   WBC 14.8 (*)    All other components within normal limits  URINALYSIS, COMPLETE (UACMP) WITH MICROSCOPIC - Abnormal; Notable for the following  components:   Color, Urine STRAW (*)    APPearance CLEAR (*)    All other components within normal limits  OSMOLALITY  SODIUM, URINE, RANDOM  OSMOLALITY, URINE     EKG  Ecg = sinus rhythm, rate 83, no gross ST elevation or depression, no significant repolarization normality, normal axis, normal intervals.  No clear evidence of ischemia and arrhythmia my interpretation.   RADIOLOGY Radiology interpreted by myself and radiology report reviewed.  No acute pathology identified.    PROCEDURES:  Critical Care performed: No  Procedures   MEDICATIONS ORDERED IN ED: Medications  acetaminophen  (TYLENOL ) tablet 650 mg (has no administration in time range)    Or  acetaminophen  (TYLENOL ) suppository 650 mg (has no administration in time range)  ondansetron  (ZOFRAN ) tablet 4 mg (has no administration in time range)    Or  ondansetron  (ZOFRAN ) injection 4 mg (has no administration in time range)  heparin  injection 5,000 Units (has no administration in time range)  senna-docusate (Senokot-S) tablet 1 tablet (has  no administration in time range)  lactated ringers  infusion ( Intravenous New Bag/Given 10/06/23 1616)  hydrALAZINE  (APRESOLINE ) injection 5 mg (has no administration in time range)  fidaxomicin (DIFICID) tablet 200 mg (has no administration in time range)  sodium chloride  0.9 % bolus 500 mL (0 mLs Intravenous Stopped 10/06/23 1615)     IMPRESSION / MDM / ASSESSMENT AND PLAN / ED COURSE  I reviewed the triage vital signs and the nursing notes.                              DDX/MDM/AP: Differential diagnosis includes, but is not limited to, underlying UTI, underlying metabolic abnormality, dehydration from diarrhea.  Do not suspect acute intra-abdominal pathology at this time.  Doubt posterior stroke.  Consider intracranial hemorrhage, skull fracture, C-spine fracture from fall.  Plan: - Labs - Small bolus IV fluid - CT head, CT C-spine  Patient's presentation is most consistent with acute presentation with potential threat to life or bodily function.  The patient is on the cardiac monitor to evaluate for evidence of arrhythmia and/or significant heart rate changes.  ED course below.  Labs with hyponatremia to 125 as well as AKI on CKD.  Suspect due to GI losses from diarrhea, outpatient stool testing pending.  Leukocytosis though benign exam here, urinalysis with no underlying infection.  Given small bolus IV fluid and admitted to hospitalist service for further workup.  Fortunately no traumatic injuries identified.  Clinical Course as of 10/06/23 1632  Fri Oct 06, 2023  1432 Received mild leukocytosis, otherwise unremarkable  CMP with AKI on CKD as well as hyponatremia [MM]  1505 Hospitalist consult order placed [MM]  1508 CT head with no acute pathology my interpretation [MM]  1509 UA w/ no e/o infxn [MM]    Clinical Course User Index [MM] Clarine Ozell LABOR, MD     FINAL CLINICAL IMPRESSION(S) / ED DIAGNOSES   Final diagnoses:  Hyponatremia  AKI (acute kidney injury)  Fall,  initial encounter  Dizziness  Diarrhea, unspecified type     Rx / DC Orders   ED Discharge Orders     None        Note:  This document was prepared using Dragon voice recognition software and may include unintentional dictation errors.   Clarine Ozell LABOR, MD 10/06/23 [phone]

## 2023-10-06 NOTE — Assessment & Plan Note (Signed)
 For squamous cell carcinoma Sutures are in tact and surrounding dermis appears clean Continue outpatient follow-up with dermatology as appropriate

## 2023-10-06 NOTE — Assessment & Plan Note (Addendum)
 Apparently third episode, patient has positive C. difficile antigen and PCR, completed 2 courses of p.o. vancomycin recently.  GI pathogen panel also positive for norovirus. Fidaxomicin 200 mg BID PO initiated on admissions, 5 days followed by 15 day taper -Supportive care

## 2023-10-06 NOTE — Assessment & Plan Note (Addendum)
 History of underlying CKD stage IIIb Suspect prerenal secondary to GI loss and poor p.o. intake Creatinine now improved to 1.58 which is around her baseline. -Continue with gentle IV fluid -Monitor renal function -Avoid nephrotoxins

## 2023-10-06 NOTE — ED Notes (Signed)
 This RN assisted patient to the bathroom, the patient is unsteady on her feet and requires assistance/supervision for toileting.

## 2023-10-06 NOTE — ED Notes (Signed)
 Patient transported to CT

## 2023-10-06 NOTE — Assessment & Plan Note (Addendum)
 Blood pressure within goal. -Continuing home metoprolol -Holding home diuretics due to AKI.

## 2023-10-07 ENCOUNTER — Other Ambulatory Visit: Payer: Self-pay

## 2023-10-07 DIAGNOSIS — N179 Acute kidney failure, unspecified: Principal | ICD-10-CM

## 2023-10-07 DIAGNOSIS — M109 Gout, unspecified: Secondary | ICD-10-CM

## 2023-10-07 DIAGNOSIS — E871 Hypo-osmolality and hyponatremia: Secondary | ICD-10-CM | POA: Diagnosis not present

## 2023-10-07 DIAGNOSIS — A0472 Enterocolitis due to Clostridium difficile, not specified as recurrent: Secondary | ICD-10-CM | POA: Diagnosis not present

## 2023-10-07 DIAGNOSIS — N1832 Chronic kidney disease, stage 3b: Secondary | ICD-10-CM

## 2023-10-07 DIAGNOSIS — M25551 Pain in right hip: Secondary | ICD-10-CM

## 2023-10-07 DIAGNOSIS — E039 Hypothyroidism, unspecified: Secondary | ICD-10-CM

## 2023-10-07 DIAGNOSIS — D72829 Elevated white blood cell count, unspecified: Secondary | ICD-10-CM

## 2023-10-07 DIAGNOSIS — I10 Essential (primary) hypertension: Secondary | ICD-10-CM

## 2023-10-07 DIAGNOSIS — E785 Hyperlipidemia, unspecified: Secondary | ICD-10-CM

## 2023-10-07 LAB — GASTROINTESTINAL PANEL BY PCR, STOOL (REPLACES STOOL CULTURE)

## 2023-10-07 LAB — CBC
HCT: 34.2 % — ABNORMAL LOW (ref 36.0–46.0)
Hemoglobin: 11.9 g/dL — ABNORMAL LOW (ref 12.0–15.0)
MCH: 30.9 pg (ref 26.0–34.0)
MCHC: 34.8 g/dL (ref 30.0–36.0)
MCV: 88.8 fL (ref 80.0–100.0)
Platelets: 249 K/uL (ref 150–400)
RBC: 3.85 MIL/uL — ABNORMAL LOW (ref 3.87–5.11)
RDW: 14.5 % (ref 11.5–15.5)
WBC: 9.2 K/uL (ref 4.0–10.5)
nRBC: 0 % (ref 0.0–0.2)

## 2023-10-07 LAB — BASIC METABOLIC PANEL WITH GFR
Anion gap: 10 (ref 5–15)
BUN: 37 mg/dL — ABNORMAL HIGH (ref 8–23)
CO2: 26 mmol/L (ref 22–32)
Calcium: 9.2 mg/dL (ref 8.9–10.3)
Chloride: 92 mmol/L — ABNORMAL LOW (ref 98–111)
Creatinine, Ser: 1.58 mg/dL — ABNORMAL HIGH (ref 0.44–1.00)
GFR, Estimated: 34 mL/min — ABNORMAL LOW (ref >60)
Glucose, Bld: 80 mg/dL (ref 70–99)
Potassium: 3.8 mmol/L (ref 3.5–5.1)
Sodium: 128 mmol/L — ABNORMAL LOW (ref 135–145)

## 2023-10-07 LAB — C DIFFICILE QUICK SCREEN W PCR REFLEX
C Diff antigen: POSITIVE — AB
C Diff toxin: NEGATIVE

## 2023-10-07 LAB — CLOSTRIDIUM DIFFICILE BY PCR, REFLEXED
Hypervirulent Strain: NEGATIVE
Toxigenic C. Difficile by PCR: POSITIVE — AB

## 2023-10-07 MED ORDER — SIMVASTATIN 20 MG PO TABS
20.0000 mg | ORAL_TABLET | Freq: Every day | ORAL | Status: DC
  Administered 2023-10-07: 20 mg via ORAL
  Filled 2023-10-07: qty 1

## 2023-10-07 MED ORDER — LEVOTHYROXINE SODIUM 50 MCG PO TABS
50.0000 ug | ORAL_TABLET | Freq: Every day | ORAL | Status: DC
  Administered 2023-10-08: 50 ug via ORAL
  Filled 2023-10-07: qty 1

## 2023-10-07 MED ORDER — ASPIRIN 81 MG PO TBEC
81.0000 mg | DELAYED_RELEASE_TABLET | Freq: Every day | ORAL | Status: DC
  Administered 2023-10-07 – 2023-10-08 (×2): 81 mg via ORAL
  Filled 2023-10-07 (×2): qty 1

## 2023-10-07 MED ORDER — OXYCODONE HCL 5 MG PO TABS
5.0000 mg | ORAL_TABLET | ORAL | Status: AC
  Administered 2023-10-07: 5 mg via ORAL
  Filled 2023-10-07: qty 1

## 2023-10-07 MED ORDER — TRAZODONE HCL 50 MG PO TABS
25.0000 mg | ORAL_TABLET | Freq: Every day | ORAL | Status: DC
  Administered 2023-10-07: 25 mg via ORAL
  Filled 2023-10-07: qty 1

## 2023-10-07 MED ORDER — METOPROLOL SUCCINATE ER 25 MG PO TB24
25.0000 mg | ORAL_TABLET | Freq: Every day | ORAL | Status: DC
Start: 2023-10-07 — End: 2023-10-08
  Administered 2023-10-07 – 2023-10-08 (×2): 25 mg via ORAL
  Filled 2023-10-07 (×2): qty 1

## 2023-10-07 MED ORDER — FLUTICASONE PROPIONATE 50 MCG/ACT NA SUSP: 1.0000 | Freq: Every day | NASAL | Status: DC | PRN

## 2023-10-07 MED ORDER — VITAMIN B-12 1000 MCG PO TABS
1000.0000 ug | ORAL_TABLET | Freq: Every day | ORAL | Status: DC
  Administered 2023-10-07 – 2023-10-08 (×2): 1000 ug via ORAL
  Filled 2023-10-07 (×2): qty 1

## 2023-10-07 MED ORDER — ALLOPURINOL 100 MG PO TABS
300.0000 mg | ORAL_TABLET | Freq: Every day | ORAL | Status: DC
  Administered 2023-10-07 – 2023-10-08 (×2): 300 mg via ORAL
  Filled 2023-10-07 (×2): qty 3

## 2023-10-07 NOTE — Assessment & Plan Note (Signed)
-   Continue home statin

## 2023-10-07 NOTE — Evaluation (Signed)
 Physical Therapy Evaluation Co-eval with OT Patient Details Name: Sheryl Kennedy MRN: [id number] DOB: [birth year] Today's Date: 10/07/2023  History of Present Illness  Ms. 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 Patient reports multiple bouts of diarrhea since June 2025 with recent reoccurence a week ago. Recently diagnosed with C-diff per outpatient GI. Recent X-rays of right hip were negative for acute fracture  Clinical Impression  78 yo Female reports increased weakness over last week with recent fall resulting in right hip pain. X-ray was negative for acute fracture/abnormality. Patient reports recent re-occurrence of diarrhea approximately 1 week ago and was diagnosed with C-diff. She reports since feeling well over last 1-2 day she has been using a walker/cane for ambulation. Patient is able to lift RLE against gravity without increase in pain. She requires supervision and extra time/effort for bed mobility. She can sit edge of bed with normal sitting balance, donning shoes. Patient does require supervision for sit to stand transfer. Upon standing she initially reported feeling dizzy but states that resolved after standing for a minute. Patient ambulates with abnormal gait pattern including minimal knee flexion during swing, increased lateral hip flexion with trendelenburg style gait. She reports this is baseline after knee surgery. She does require min A for balance/stability when walking with RW. She demonstrates gross strength of 3/5 in BLE. Patient would benefit from skilled PT Intervention to improve LE strength, balance and mobility.         If plan is discharge home, recommend the following: A little help with walking and/or transfers;A little help with  bathing/dressing/bathroom;Assistance with cooking/housework;Assist for transportation;Help with stairs or ramp for entrance   Can travel by private vehicle        Equipment Recommendations None recommended by PT  Recommendations for Other Services       Functional Status Assessment Patient has had a recent decline in their functional status and demonstrates the ability to make significant improvements in function in a reasonable and predictable amount of time.     Precautions / Restrictions Precautions Precautions: Fall Precaution/Restrictions Comments: reports recent fall on right hip, with occasional falls in last 6 months Restrictions Weight Bearing Restrictions Per Provider Order: No      Mobility  Bed Mobility Overal bed mobility: Needs Assistance Bed Mobility: Supine to Sit     Supine to sit: Supervision     General bed mobility comments: incr time/effort    Transfers Overall transfer level: Needs assistance Equipment used: Rolling walker (2 wheels) Transfers: Sit to/from Stand Sit to Stand: Supervision           General transfer comment: SBA    Ambulation/Gait Ambulation/Gait assistance: Min assist, Contact guard assist Gait Distance (Feet): 25 Feet Assistive device: Rolling walker (2 wheels) Gait Pattern/deviations: Wide base of support Gait velocity: decreased     General Gait Details: pt ambulates with increased lateral lean with trendelenburg type gait; minimal knee flexion during swing, circumduction noted during swing  Stairs            Wheelchair Mobility     Tilt Bed    Modified Rankin (Stroke Patients Only)       Balance Overall balance assessment: Needs assistance Sitting-balance support: No upper extremity supported, Feet supported Sitting balance-Leahy Scale: Good     Standing  balance support: Bilateral upper extremity supported, During functional activity, Reliant on assistive device for balance Standing  balance-Leahy Scale: Poor Standing balance comment: unsteady, requires heavy CGA with RW                             Pertinent Vitals/Pain Pain Assessment Pain Assessment: 0-10 Pain Score: 2  Pain Location: overall, right hip Pain Descriptors / Indicators: Aching Pain Intervention(s): Limited activity within patient's tolerance, Monitored during session, Repositioned    Home Living Family/patient expects to be discharged to:: Private residence Living Arrangements: Spouse/significant other Available Help at Discharge: Family;Available 24 hours/day Type of Home: House Home Access: Ramped entrance       Home Layout: Two level;Able to live on main level with bedroom/bathroom Home Equipment: Rolling Walker (2 wheels);Cane - single point      Prior Function Prior Level of Function : Independent/Modified Independent             Mobility Comments: using RW vs SPC in the last day or two 2/2 weakness       Extremity/Trunk Assessment   Upper Extremity Assessment Upper Extremity Assessment: Generalized weakness    Lower Extremity Assessment Lower Extremity Assessment: Generalized weakness;LLE deficits/detail RLE Deficits / Details: R hip pain, gross strength at least 3/5 (able to lift leg against gravity) LLE Deficits / Details: grossly 3/5    Cervical / Trunk Assessment Cervical / Trunk Assessment: Kyphotic  Communication   Communication Communication: No apparent difficulties    Cognition Arousal: Alert Behavior During Therapy: WFL for tasks assessed/performed                             Following commands: Intact       Cueing Cueing Techniques: Verbal cues     General Comments General comments (skin integrity, edema, etc.): has increased swelling in BLE lower leg; wearing TED hose; notified RN as patient stated she didn't think she had taken her fluid pill today    Exercises Other Exercises Other Exercises: Pt/family educated in  role of PT and discharge recommendations   Assessment/Plan    PT Assessment Patient needs continued PT services  PT Problem List Decreased strength;Pain;Decreased activity tolerance;Decreased balance;Decreased safety awareness;Decreased mobility       PT Treatment Interventions DME instruction;Balance training;Gait training;Stair training;Functional mobility training;Patient/family education;Therapeutic activities;Therapeutic exercise    PT Goals (Current goals can be found in the Care Plan section)  Acute Rehab PT Goals Patient Stated Goal: to get stronger PT Goal Formulation: With patient Time For Goal Achievement: 10/21/23 Potential to Achieve Goals: Good    Frequency Min 1X/week     Co-evaluation PT/OT/SLP Co-Evaluation/Treatment: Yes Reason for Co-Treatment: For patient/therapist safety PT goals addressed during session: Mobility/safety with mobility OT goals addressed during session: ADL's and self-care       AM-PAC PT 6 Clicks Mobility  Outcome Measure Help needed turning from your back to your side while in a flat bed without using bedrails?: None Help needed moving from lying on your back to sitting on the side of a flat bed without using bedrails?: A Little Help needed moving to and from a bed to a chair (including a wheelchair)?: A Little Help needed standing up from a chair using your arms (e.g., wheelchair or bedside chair)?: A Little Help needed to walk in hospital room?: A Little Help needed climbing 3-5 steps with a railing? : A Little  6 Click Score: 19    End of Session Equipment Utilized During Treatment: Gait belt Activity Tolerance: Patient tolerated treatment well Patient left: in chair;with call bell/phone within reach;with chair alarm set;with family/visitor present Nurse Communication: Mobility status PT Visit Diagnosis: Other abnormalities of gait and mobility (R26.89);Muscle weakness (generalized) (M62.81)    Time: 8566-8541 PT Time  Calculation (min) (ACUTE ONLY): 25 min   Charges:   PT Evaluation $PT Eval Low Complexity: 1 Low   PT General Charges $$ ACUTE PT VISIT: 1 Visit           Trelyn Vanderlinde PT,DPT 10/07/2023, 3:45 PM

## 2023-10-07 NOTE — Progress Notes (Signed)
 Progress Note   Patient: Sheryl Kennedy FMW:[id number] DOB: [birth year] DOA: 10/06/2023     1 DOS: the patient was seen and examined on 10/07/2023   Brief hospital course: Ms. 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  He was also having recurrence of diarrhea.  Patient with history of chronic diarrhea since early July,She has been diagnosed with C. difficile colitis twice and up both times, she was compliant with the oral vancomycin that was prescribed.   Vitals in the ED showed t of 98.3, rr 16, heart rate 87, blood pressure 101/73, SpO2 100% on room air.  Serum sodium is 125, potassium 4.2, chloride 85, bicarb 27, BUN of 45, sCr 2.30, eGFR 21, nonfasting glucose 147, WBC 14.8, hemoglobin 13.4, platelets of 269.  UA was negative for leukocytes and nitrates.  ED treatment: Sodium chloride  500 mL liter bolus. Patient was started on fidaxomicin with a taper for concern of recurrence of C. difficile colitis.  10/4: Vital stable, leukocytosis resolved, sodium of 128, creatinine with some improvement to 1.58-it was 1.5 in July 2025, hyponatremia labs with normal serum osmolality, low urine osmolality and urine sodium of 71.  Home HCTZ was held and should not continue on discharge. C. difficile positive for antigen and negative for toxin with positive PCR.  GI pathogen panel positive for norovirus.  Message sent to our pharmacy to look for her benefits and cost for deficit. PT is recommending home health  Assessment and Plan: * AKI (acute kidney injury) History of underlying CKD stage IIIb Suspect prerenal secondary to GI loss and poor p.o. intake Creatinine now improved to 1.58 which is around her baseline. -Continue with gentle IV fluid -Monitor renal function -Avoid  nephrotoxins  Hyponatremia Likely due to poor p.o. intake and also taking HCTZ at home.  Seems chronic with gradual worsening, sodium with some improvement to 128 today Hold home hydrochlorothiazide on admission,-should be discontinued -Hyponatremia labs with normal serum osmolality, urine sodium of 70 and and mild hypoosmolar urine. - Monitor sodium  C. difficile colitis Apparently third episode, patient has positive C. difficile antigen and PCR, completed 2 courses of p.o. vancomycin recently.  GI pathogen panel also positive for norovirus. Fidaxomicin 200 mg BID PO initiated on admissions, 5 days followed by 15 day taper -Supportive care  Leukocytosis UA is negative for leukocytes and nitrates Likely secondary to C. difficile colitis and norovirus -Improved today - Continue to monitor  Benign essential hypertension Blood pressure within goal. -Continuing home metoprolol -Holding home diuretics due to AKI.  Gout - Continue home allopurinol   Hyperlipidemia, unspecified - Continue home statin  Right hip pain Patient able to weakly flex and extend her right hips and knees. She reports difficulty ambulating Right hip x-ray with no acute fracture or dislocation PT, OT evaluation-recommending home health  S/P Mohs surgery for basal cell carcinoma For squamous cell carcinoma Sutures are in tact and surrounding dermis appears clean Continue outpatient follow-up with dermatology as appropriate  Hypothyroidism - Continue home Synthroid    Subjective: Patient was seen and examined today.  Diarrhea seems improving, had 2 episodes since morning.  Denies any significant abdominal pain.  Daughter at bedside.  Physical Exam: Vitals:   10/06/23 1742 10/06/23 1930 10/07/23 0315 10/07/23 0808  BP:  116/69 (!) 112/59 117/72  Pulse:  90 80 84  Resp:  16 17  Temp: 98.2 F (36.8 C) 98.6 F (37 C) 98.4 F (36.9 C) 98.5 F (36.9 C)  TempSrc: Oral Oral Oral   SpO2:  97% 97% 100%   Weight:      Height:       General.  Frail elderly lady, in no acute distress. Pulmonary.  Lungs clear bilaterally, normal respiratory effort. CV.  Regular rate and rhythm, no JVD, rub or murmur. Abdomen.  Soft, nontender, nondistended, BS positive. CNS.  Alert and oriented .  No focal neurologic deficit. Extremities.  No edema, no cyanosis, pulses intact and symmetrical. Psychiatry.  Judgment and insight appears normal.   Data Reviewed: Prior data reviewed  Family Communication: Discussed with daughter at bedside  Disposition: Status is: Inpatient Remains inpatient appropriate because: Severity of illness  Planned Discharge Destination: Home with Home Health  DVT prophylaxis.  Subcu heparin  Time spent: 50 minutes  This record has been created using Conservation officer, historic buildings. Errors have been sought and corrected,but may not always be located. Such creation errors do not reflect on the standard of care.   Author: Amaryllis Dare, MD 10/07/2023 3:05 PM  For on call review [email or web address].

## 2023-10-07 NOTE — Evaluation (Signed)
 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     Clinical Impressions Pt was seen for OT evaluation this date with co-tx with PT for safety and ADL/mobility progression. Prior to hospital admission, pt was indep with ADL, walking without AD, and hx of multiple falls per pt/dtr. Pt lives with her spouse on the main floor of a 2 story home. Pt presents with deficits in strength, balance, activity tolerance, and R hip pain affecting safe and optimal ADL completion. Pt currently requires set up and supv for seated LB ADL, PRN MIN A for LB ADL involving standing/ADL transfers. Heavy CGA with mobility using RW, unsteady. Pt/family educated in benefits of shower chair use to decrease risk of falls and support activity tolerance. Pt does endorse SOB with exertion. Pt would benefit from skilled OT services to address noted impairments and functional limitations (see below for any additional details) in order to maximize safety and independence while minimizing future risk of falls, injury, and readmission. Anticipate the need for follow up OT services upon acute hospital DC.    If plan is discharge home, recommend the following:   A little help with walking and/or transfers;A little help with bathing/dressing/bathroom;Assistance with cooking/housework;Assist for transportation;Help with stairs or ramp for entrance     Functional Status Assessment   Patient has had a recent decline in their functional status and demonstrates the ability to make significant improvements in function in a reasonable and predictable amount of time.     Equipment  Recommendations   Tub/shower seat     Recommendations for Other Services         Precautions/Restrictions   Precautions Precautions: Fall Restrictions Weight Bearing Restrictions Per Provider Order: No     Mobility Bed Mobility Overal bed mobility: Needs Assistance Bed Mobility: Supine to Sit     Supine to sit: Supervision     General bed mobility comments: incr time/effort    Transfers Overall transfer level: Needs assistance Equipment used: Rolling walker (2 wheels) Transfers: Sit to/from Stand Sit to Stand: Supervision           General transfer comment: SBA      Balance Overall balance assessment: Needs assistance Sitting-balance support: No upper extremity supported, Feet supported Sitting balance-Leahy Scale: Good     Standing balance support: Bilateral upper extremity supported, During functional activity, Reliant on assistive device for balance Standing balance-Leahy Scale: Poor Standing balance comment: unsteady, requires heavy CGA with RW                           ADL either performed or assessed with clinical judgement   ADL Overall ADL's : Needs assistance/impaired                     Lower Body Dressing: Sitting/lateral leans;Set up;Supervision/safety Lower Body Dressing Details (indicate cue type and reason): supv for socks/shoes seated EOB; anticipate CGA to complete ADL in standing 2/2 decreased balance             Functional mobility during ADLs: Contact guard assist;Rolling walker (2 wheels)        Pertinent Vitals/Pain Pain Assessment Pain Assessment: 0-10  Pain Score: 2  Pain Location: overall Pain Descriptors / Indicators: Aching Pain Intervention(s): Limited activity within patient's tolerance, Monitored during session, Repositioned     Extremity/Trunk Assessment Upper Extremity Assessment Upper Extremity Assessment: Generalized weakness   Lower Extremity Assessment Lower Extremity  Assessment: Generalized weakness;Defer to PT evaluation;RLE deficits/detail RLE Deficits / Details: R hip pain       Communication Communication Communication: No apparent difficulties   Cognition Arousal: Alert Behavior During Therapy: WFL for tasks assessed/performed Cognition: No apparent impairments          Following commands: Intact       Cueing  General Comments   Cueing Techniques: Verbal cues      Exercises Other Exercises Other Exercises: Pt/family edu in shower chair for improving safety with bathing   Shoulder Instructions      Home Living Family/patient expects to be discharged to:: Private residence Living Arrangements: Spouse/significant other Available Help at Discharge: Family;Available 24 hours/day Type of Home: House Home Access: Ramped entrance     Home Layout: Two level;Able to live on main level with bedroom/bathroom     Bathroom Shower/Tub: Producer, television/film/video: Standard     Home Equipment: Agricultural consultant (2 wheels);Cane - single point          Prior Functioning/Environment Prior Level of Function : Independent/Modified Independent             Mobility Comments: using RW vs SPC in the last day or two 2/2 weakness      OT Problem List: Pain;Decreased strength;Impaired balance (sitting and/or standing);Decreased knowledge of use of DME or AE   OT Treatment/Interventions: Self-care/ADL training;Therapeutic activities;Therapeutic exercise;Energy conservation;DME and/or AE instruction;Patient/family education;Balance training      OT Goals(Current goals can be found in the care plan section)   Acute Rehab OT Goals Patient Stated Goal: go home OT Goal Formulation: With patient/family Time For Goal Achievement: 10/21/23 Potential to Achieve Goals: Good ADL Goals Additional ADL Goal #1: Pt will complete a shower, primarily in sitting, using learned ECS as needed, with mod indep, 1/1 opportunity. Additional ADL  Goal #2: Pt will verbalize plan to implement at least 2 learned ECS/falls prevention strategies to maximize safety.   OT Frequency:  Min 2X/week    Co-evaluation PT/OT/SLP Co-Evaluation/Treatment: Yes Reason for Co-Treatment: For patient/therapist safety PT goals addressed during session: Mobility/safety with mobility OT goals addressed during session: ADL's and self-care      AM-PAC OT 6 Clicks Daily Activity     Outcome Measure Help from another person eating meals?: None Help from another person taking care of personal grooming?: None Help from another person toileting, which includes using toliet, bedpan, or urinal?: A Little Help from another person bathing (including washing, rinsing, drying)?: A Little Help from another person to put on and taking off regular upper body clothing?: None Help from another person to put on and taking off regular lower body clothing?: A Little 6 Click Score: 21   End of Session Equipment Utilized During Treatment: Rolling walker (2 wheels);Gait belt Nurse Communication: Mobility status;Other (comment) (pt asking about home meds she typically takes but hasn't taken since admission)  Activity Tolerance: Patient tolerated treatment well Patient left: in chair;with call bell/phone within reach;with chair alarm set;with family/visitor present  OT Visit Diagnosis: Muscle weakness (generalized) (M62.81);Repeated falls (R29.6);Unsteadiness on feet (R26.81);Pain Pain - Right/Left: Right Pain - part of body: Hip  Time: 8567-8547 OT Time Calculation (min): 20 min Charges:  OT General Charges $OT Visit: 1 Visit OT Evaluation $OT Eval Low Complexity: 1 Low  Warren SAUNDERS., MPH, MS, OTR/L ascom [phone] 10/07/23, 3:13 PM

## 2023-10-07 NOTE — Plan of Care (Signed)

## 2023-10-07 NOTE — Assessment & Plan Note (Signed)
 Continue home Synthroid

## 2023-10-07 NOTE — Assessment & Plan Note (Signed)
 Continue home allopurinol

## 2023-10-08 ENCOUNTER — Other Ambulatory Visit: Payer: Self-pay

## 2023-10-08 DIAGNOSIS — W19XXXA Unspecified fall, initial encounter: Secondary | ICD-10-CM

## 2023-10-08 DIAGNOSIS — N179 Acute kidney failure, unspecified: Secondary | ICD-10-CM | POA: Diagnosis not present

## 2023-10-08 DIAGNOSIS — E871 Hypo-osmolality and hyponatremia: Secondary | ICD-10-CM | POA: Diagnosis not present

## 2023-10-08 DIAGNOSIS — Z9889 Other specified postprocedural states: Secondary | ICD-10-CM

## 2023-10-08 DIAGNOSIS — Z85828 Personal history of other malignant neoplasm of skin: Secondary | ICD-10-CM

## 2023-10-08 DIAGNOSIS — R197 Diarrhea, unspecified: Secondary | ICD-10-CM

## 2023-10-08 DIAGNOSIS — R42 Dizziness and giddiness: Secondary | ICD-10-CM

## 2023-10-08 LAB — CBC
HCT: 39.3 % (ref 36.0–46.0)
Hemoglobin: 13.1 g/dL (ref 12.0–15.0)
MCH: 30.7 pg (ref 26.0–34.0)
MCHC: 33.3 g/dL (ref 30.0–36.0)
MCV: 92 fL (ref 80.0–100.0)
Platelets: 296 K/uL (ref 150–400)
RBC: 4.27 MIL/uL (ref 3.87–5.11)
RDW: 14.8 % (ref 11.5–15.5)
WBC: 10.1 K/uL (ref 4.0–10.5)
nRBC: 0 % (ref 0.0–0.2)

## 2023-10-08 LAB — BASIC METABOLIC PANEL WITH GFR
Anion gap: 8 (ref 5–15)
BUN: 24 mg/dL — ABNORMAL HIGH (ref 8–23)
CO2: 29 mmol/L (ref 22–32)
Calcium: 9.5 mg/dL (ref 8.9–10.3)
Chloride: 93 mmol/L — ABNORMAL LOW (ref 98–111)
Creatinine, Ser: 1.14 mg/dL — ABNORMAL HIGH (ref 0.44–1.00)
GFR, Estimated: 50 mL/min — ABNORMAL LOW (ref >60)
Glucose, Bld: 108 mg/dL — ABNORMAL HIGH (ref 70–99)
Potassium: 3.9 mmol/L (ref 3.5–5.1)
Sodium: 130 mmol/L — ABNORMAL LOW (ref 135–145)

## 2023-10-08 MED ORDER — FIDAXOMICIN 200 MG PO TABS
ORAL_TABLET | ORAL | 0 refills | Status: DC
  Filled 2023-10-08 (×2): qty 6, 3d supply, fill #0
  Filled 2023-10-08: qty 13, 18d supply, fill #0
  Filled 2023-10-09 – 2023-10-10 (×2): qty 7, 15d supply, fill #1
  Filled 2023-10-10: qty 6, 13d supply, fill #1
  Filled 2023-10-10: qty 1, 2d supply, fill #2

## 2023-10-08 NOTE — Discharge Summary (Signed)
 Physician Discharge Summary   Patient: Sheryl Kennedy MRN: [id number] DOB: November 29, 1945  Admit date:     10/06/2023  Discharge date: 10/08/23  Discharge Physician: Amaryllis Dare   PCP: Auston Reyes [name], MD   Recommendations at discharge:  Please obtain CBC and BMP on follow-up Follow-up with primary care provider Follow-up with gastroenterology  Discharge Diagnoses: Principal Problem:   AKI (acute kidney injury) Active Problems:   Chronic kidney disease, stage 3b (HCC)   Hyponatremia   C. difficile colitis   Leukocytosis   Benign essential hypertension   Gout   Hyperlipidemia, unspecified   Right hip pain   S/P Mohs surgery for basal cell carcinoma   Hypothyroidism   Diarrhea   Fall   Dizziness   Hospital Course: Ms. 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  He was also having recurrence of diarrhea.  Patient with history of chronic diarrhea since early July,She has been diagnosed with C. difficile colitis twice and up both times, she was compliant with the oral vancomycin that was prescribed.   Vitals in the ED showed t of 98.3, rr 16, heart rate 87, blood pressure 101/73, SpO2 100% on room air.  Serum sodium is 125, potassium 4.2, chloride 85, bicarb 27, BUN of 45, sCr 2.30, eGFR 21, nonfasting glucose 147, WBC 14.8, hemoglobin 13.4, platelets of 269.  UA was negative for leukocytes and nitrates.  ED treatment: Sodium chloride  500 mL liter bolus. Patient was started on fidaxomicin with a taper for concern of recurrence of C. difficile colitis.  10/4: Vital stable, leukocytosis resolved, sodium of 128, creatinine with some improvement to 1.58-it was 1.5 in July 2025, hyponatremia labs with normal serum osmolality, low urine osmolality and urine sodium of 71.  Home HCTZ  was held and should not continue on discharge. C. difficile positive for antigen and negative for toxin with positive PCR.  GI pathogen panel positive for norovirus.  PT is recommending home health-patient declined at this time.  10/5: Remained hemodynamically stable.  Diarrhea has been resolved.  Leukocytosis resolved.  Sodium improving and currently at 130.  Patient really wants to go home.  Having difficulty getting her Deficit due to higher cost, she received 3 days supply with payment and need to fill out patient assistance program application to get rest of the days with help.  We also held her combination pill of spironolactone and HCTZ, she should not be taking HCTZ due to hyponatremia, she was asked to discuss with her primary care provider or cardiologist and they can resume spironolactone when appropriate.  Patient will continue the rest of her home medications and need to have a close follow-up with her providers for further assistance.  Assessment and Plan: * AKI (acute kidney injury) History of underlying CKD stage IIIb Suspect prerenal secondary to GI loss and poor p.o. intake Creatinine now improved to 1.14 which is better than her baseline. - Encourage p.o. hydration  Hyponatremia Likely due to poor p.o. intake and also taking HCTZ at home.  Seems chronic with gradual worsening, sodium with some improvement to 130 today Hold home hydrochlorothiazide on admission,-should be discontinued, will let outpatient provider decide -Hyponatremia labs with normal serum osmolality, urine sodium of 70 and and mild hypoosmolar urine. - Monitor sodium  C. difficile colitis Apparently third episode, patient has positive C. difficile antigen  and PCR, completed 2 courses of p.o. vancomycin recently.  GI pathogen panel also positive for norovirus. Patient has equivocal results but decided to treat due to her history of 2 recent episodes of C. difficile colitis. Fidaxomicin 200 mg BID PO  initiated on admissions, 5 days followed by 15 day taper -Supportive care  Leukocytosis UA is negative for leukocytes and nitrates Likely secondary to C. difficile colitis and norovirus Improved  Benign essential hypertension Blood pressure within goal. -Continuing home metoprolol -Holding home diuretics due to AKI, will resume in 1 to 2 days.  Gout - Continue home allopurinol   Hyperlipidemia, unspecified - Continue home statin  Right hip pain Patient able to weakly flex and extend her right hips and knees. She reports difficulty ambulating Right hip x-ray with no acute fracture or dislocation PT, OT evaluation-recommending home health-patient declined  S/P Mohs surgery for basal cell carcinoma For squamous cell carcinoma Sutures are in tact and surrounding dermis appears clean Continue outpatient follow-up with dermatology as appropriate  Hypothyroidism - Continue home Synthroid   Consultants: None Procedures performed: None Disposition: Home Diet recommendation:  Discharge Diet Orders (From admission, onward)     Start     Ordered   10/08/23 0000  Diet - low sodium heart healthy        10/08/23 1415           Regular diet DISCHARGE MEDICATION: Allergies as of 10/08/2023       Reactions   Naproxen Swelling   In hands and joints   Pseudoephedrine Rash, Other (See Comments)   Rhinitis and sneezing Rhinitis and sneezing Rhinitis and sneezing   Shellfish Allergy Other (See Comments)   Joint swelling, and gout    Sulfa Antibiotics Rash        Medication List     PAUSE taking these medications    spironolactone-hydrochlorothiazide 25-25 MG tablet Wait to take this until your doctor or other care provider tells you to start again. Commonly known as: ALDACTAZIDE Take 1 tablet by mouth daily.       STOP taking these medications    alendronate 70 MG tablet Commonly known as: FOSAMAX       TAKE these medications    allopurinol  300 MG  tablet Commonly known as: ZYLOPRIM  Take 300 mg by mouth daily.   aspirin  EC 81 MG tablet Take 81 mg by mouth daily.   clobetasol  0.05 % external solution Commonly known as: TEMOVATE  APPLY TOPICALLY TO SCALP IN THE MORNING AFTER SHAMPOO FOR ITCHY SCALP   colchicine  0.6 MG tablet Take 0.5 tablets (0.3 mg total) by mouth daily for 7 days.   Cyanocobalamin  1000 MCG Tbcr Take 1 tablet by mouth daily.   Dificid 200 MG Tabs tablet Generic drug: fidaxomicin Take 1 tablet (200 mg total) by mouth 2 (two) times daily for 3 days, THEN 1 tablet (200 mg total) every other day for 15 days. Start taking on: October 08, 2023   ESTROVEN PM PO Take 0.5 tablets by mouth at bedtime. For hotflashes   fexofenadine 180 MG tablet Commonly known as: ALLEGRA Take 180 mg by mouth daily as needed for allergies.   Fluocinolone  Acetonide Body 0.01 % Oil Apply topically to itchy scaly spot at scalp cover with shower cap qhs   fluticasone 50 MCG/ACT nasal spray Commonly known as: FLONASE Place 1 spray into both nostrils daily as needed for allergies or rhinitis.   ketoconazole  2 % shampoo Commonly known as: NIZORAL  WASH SCALP 2-3 TIMES WEEKLY, LET  SIT 5 MINUTES AND RINSE   levothyroxine  50 MCG tablet Commonly known as: SYNTHROID  Take 50 mcg by mouth daily before breakfast.   metoprolol succinate 25 MG 24 hr tablet Commonly known as: TOPROL-XL Take 25 mg by mouth daily.   mupirocin  ointment 2 % Commonly known as: BACTROBAN  APPLY 1 APPLICATION TOPICALLY DAILY   potassium chloride  SA 20 MEQ tablet Commonly known as: KLOR-CON  M Take 20 mEq by mouth 3 (three) times daily.   simvastatin  20 MG tablet Commonly known as: ZOCOR  Take 20 mg by mouth at bedtime.   tiZANidine 2 MG tablet Commonly known as: ZANAFLEX Take 2 mg by mouth 3 (three) times daily as needed.   torsemide 20 MG tablet Commonly known as: DEMADEX Take 20 mg by mouth daily.   traMADol  50 MG tablet Commonly known as:  ULTRAM  Take 50 mg by mouth every 6 (six) hours as needed for moderate pain (pain score 4-6).   traZODone 50 MG tablet Commonly known as: DESYREL Take 25 mg by mouth at bedtime.   triamcinolone  cream 0.1 % Commonly known as: KENALOG  Apply to affected areas rash on body once to twice daily until improved. Avoid face, groin, axilla.   Vitamin D3 25 MCG (1000 UT) Caps Take by mouth.        Follow-up Information     Auston Reyes BIRCH, MD. Schedule an appointment as soon as possible for a visit in 1 week(s).   Specialty: Internal Medicine Contact information: [facility] [address] [place] [address] [phone]                Discharge Exam: Fredricka Weights   10/06/23 1325  Weight: 61.5 kg   General.  Frail elderly lady, in no acute distress. Pulmonary.  Lungs clear bilaterally, normal respiratory effort. CV.  Regular rate and rhythm, no JVD, rub or murmur. Abdomen.  Soft, nontender, nondistended, BS positive. CNS.  Alert and oriented .  No focal neurologic deficit. Extremities.  No edema, no cyanosis, pulses intact and symmetrical. Psychiatry.  Judgment and insight appears normal.   Condition at discharge: stable  The results of significant diagnostics from this hospitalization (including imaging, microbiology, ancillary and laboratory) are listed below for reference.   Imaging Studies: DG HIP UNILAT WITH PELVIS 2-3 VIEWS RIGHT Result Date: 10/06/2023 CLINICAL DATA:  Right hip pain.  Fall. EXAM: DG HIP (WITH OR WITHOUT PELVIS) 2-3V RIGHT COMPARISON:  None Available. FINDINGS: No acute fracture of the pelvis or right hip. No hip dislocation. Pubic rami are intact. No pubic symphyseal or sacroiliac diastasis. The bones are subjectively under mineralized. Lower lumbar degenerative change. Scattered phleboliths in the pelvis. IMPRESSION: No acute fracture or dislocation of the pelvis or right hip. Electronically Signed   By: Andrea Gasman M.D.   On:  10/06/2023 17:50   DG Chest Port 1 View Result Date: 10/06/2023 CLINICAL DATA:  Leukocytosis EXAM: PORTABLE CHEST 1 VIEW COMPARISON:  Chest CT 06/16/2020 FINDINGS: The heart size and mediastinal contours are within normal limits. Both lungs are clear. The visualized skeletal structures are unremarkable. Aortic atherosclerosis. IMPRESSION: No active disease. Electronically Signed   By: Luke Bun M.D.   On: 10/06/2023 16:35   CT HEAD WO CONTRAST Result Date: 10/06/2023 CLINICAL DATA:  Provided history: Ataxia, acute, traumatic (Ped 0-17y) Dizziness leading to fall last night. EXAM: CT HEAD WITHOUT CONTRAST TECHNIQUE: Contiguous axial images were obtained from the base of the skull through the vertex without intravenous contrast. RADIATION DOSE REDUCTION: This  exam was performed according to the departmental dose-optimization program which includes automated exposure control, adjustment of the mA and/or kV according to patient size and/or use of iterative reconstruction technique. COMPARISON:  Head CT 09/11/2018 FINDINGS: Brain: No intracranial hemorrhage, mass effect, or midline shift. Brain volume is normal for age. No hydrocephalus. The basilar cisterns are patent. Mild periventricular chronic small vessel ischemia, normal for age. No evidence of territorial infarct or acute ischemia. No extra-axial or intracranial fluid collection. Vascular: Atherosclerosis of skullbase vasculature without hyperdense vessel or abnormal calcification. Skull: No fracture or focal lesion. Sinuses/Orbits: Paranasal sinuses and mastoid air cells are clear. The visualized orbits are unremarkable. Other: Frontal scalp laceration. IMPRESSION: Frontal scalp laceration. No acute intracranial abnormality. No skull fracture. Electronically Signed   By: Andrea Gasman M.D.   On: 10/06/2023 16:00   CT Cervical Spine Wo Contrast Result Date: 10/06/2023 CLINICAL DATA:  Provided history: Ataxia, cervical trauma Dizziness leading to  fall last night. EXAM: CT CERVICAL SPINE WITHOUT CONTRAST TECHNIQUE: Multidetector CT imaging of the cervical spine was performed without intravenous contrast. Multiplanar CT image reconstructions were also generated. RADIATION DOSE REDUCTION: This exam was performed according to the departmental dose-optimization program which includes automated exposure control, adjustment of the mA and/or kV according to patient size and/or use of iterative reconstruction technique. COMPARISON:  Cervical spine MRI 08/23/2021 FINDINGS: Alignment: Straightening of normal lordosis. No traumatic subluxation. Chronic grade 1 anterolisthesis of C3 on C4. Skull base and vertebrae: No acute fracture. Vertebral body heights are maintained. The dens and skull base are intact. Degenerative pannus at C1-C2. Soft tissues and spinal canal: No prevertebral fluid or swelling. No visible canal hematoma. Disc levels: Moderate to advanced multilevel degenerative disc disease and facet hypertrophy. Upper chest: No acute findings. Other: None. IMPRESSION: 1. No acute fracture or traumatic subluxation of the cervical spine. 2. Moderate-advanced multilevel degenerative disc disease and facet hypertrophy. Electronically Signed   By: Andrea Gasman M.D.   On: 10/06/2023 15:56    Microbiology: Results for orders placed or performed during the hospital encounter of 10/06/23  C Difficile Quick Screen w PCR reflex     Status: Abnormal   Collection Time: 10/07/23  9:33 AM   Specimen: STOOL  Result Value Ref Range Status   C Diff antigen POSITIVE (A) NEGATIVE Final   C Diff toxin NEGATIVE NEGATIVE Final   C Diff interpretation Results are indeterminate. See PCR results.  Final    Comment: Performed at [facility], [address] [place], [place] [address]  Gastrointestinal Panel by PCR , Stool     Status: Abnormal   Collection Time: 10/07/23  9:33 AM   Specimen: STOOL  Result Value Ref Range Status   Campylobacter species NOT  DETECTED NOT DETECTED Final   Plesimonas shigelloides NOT DETECTED NOT DETECTED Final   Salmonella species NOT DETECTED NOT DETECTED Final   Yersinia enterocolitica NOT DETECTED NOT DETECTED Final   Vibrio species NOT DETECTED NOT DETECTED Final   Vibrio cholerae NOT DETECTED NOT DETECTED Final   Enteroaggregative E coli (EAEC) NOT DETECTED NOT DETECTED Final   Enteropathogenic E coli (EPEC) NOT DETECTED NOT DETECTED Final   Enterotoxigenic E coli (ETEC) NOT DETECTED NOT DETECTED Final   Shiga like toxin producing E coli (STEC) NOT DETECTED NOT DETECTED Final   Shigella/Enteroinvasive E coli (EIEC) NOT DETECTED NOT DETECTED Final   Cryptosporidium NOT DETECTED NOT DETECTED Final   Cyclospora cayetanensis NOT DETECTED NOT DETECTED Final   Entamoeba histolytica NOT  DETECTED NOT DETECTED Final   Giardia lamblia NOT DETECTED NOT DETECTED Final   Adenovirus F40/41 NOT DETECTED NOT DETECTED Final   Astrovirus NOT DETECTED NOT DETECTED Final   Norovirus GI/GII DETECTED (A) NOT DETECTED Final    Comment: RESULT CALLED TO, READ BACK BY AND VERIFIED WITH: TIA D., RN AT 1227 10/07/23 RAM    Rotavirus A NOT DETECTED NOT DETECTED Final   Sapovirus (I, II, IV, and V) NOT DETECTED NOT DETECTED Final    Comment: Performed at [facility], [address] [place], [place] [address]  C. Diff by PCR, Reflexed     Status: Abnormal   Collection Time: 10/07/23  9:33 AM  Result Value Ref Range Status   Toxigenic C. Difficile by PCR POSITIVE (A) NEGATIVE Final    Comment: Positive for toxigenic C. difficile with little to no toxin production. Only treat if clinical presentation suggests symptomatic illness.   Hypervirulent Strain PRESUMPTIVE NEGATIVE PRESUMPTIVE NEGATIVE Final    Comment: Performed at [facility], [address] [place], [place] [address]    Labs: CBC: Recent Labs  Lab 10/06/23 1335 10/07/23 0506 10/08/23 0925  WBC 14.8* 9.2 10.1  HGB 13.4 11.9* 13.1  HCT 38.7  34.2* 39.3  MCV 91.5 88.8 92.0  PLT 269 249 296   Basic Metabolic Panel: Recent Labs  Lab 10/06/23 1335 10/07/23 0506 10/08/23 0925  NA 125* 128* 130*  K 4.2 3.8 3.9  CL 85* 92* 93*  CO2 27 26 29   GLUCOSE 147* 80 108*  BUN 45* 37* 24*  CREATININE 2.30* 1.58* 1.14*  CALCIUM 10.1 9.2 9.5   Liver Function Tests: Recent Labs  Lab 10/06/23 1335  AST 45*  ALT 25  ALKPHOS 65  BILITOT 0.7  PROT 6.3*  ALBUMIN 3.7   CBG: No results for input(s): GLUCAP in the last 168 hours.  Discharge time spent: greater than 30 minutes.  This record has been created using Conservation officer, historic buildings. Errors have been sought and corrected,but may not always be located. Such creation errors do not reflect on the standard of care.   Signed: Amaryllis Dare, MD [facility] 10/08/2023

## 2023-10-08 NOTE — TOC Initial Note (Signed)
 Transition of Care [facility]) - Initial/Assessment Note    Patient Details  Name: Sheryl Kennedy MRN: [id number] Date of Birth: Apr 10, 1945  Transition of Care [facility]) CM/SW Contact:    Victory Jackquline RAMAN, RN Phone Number: 10/08/2023, 8:56 AM  Clinical Narrative:    Chart reviewed. RNCM, spoke with the patient, I introduced myself, my role, and explained that discharge planning recommendations would be discussed. PT recommended Home with Home Health/PT. Patient is declining HH/PT at this time. She doesn't feel like she needs it. States she's walking fine and getting better each Nathasha Fiorillo. Pt has DME at home( RW and Luis Llorons Torres). RNCM will continue to follow for discharge planning/care coordination and update as applicable.    Expected Discharge Plan: Home w Home Health Services Barriers to Discharge: Continued Medical Work up   Patient Goals and CMS Choice            Expected Discharge Plan and Services       Living arrangements for the past 2 months: Single Family Home                                      Prior Living Arrangements/Services Living arrangements for the past 2 months: Single Family Home Lives with:: Spouse Patient language and need for interpreter reviewed:: Yes Do you feel safe going back to the place where you live?: Yes      Need for Family Participation in Patient Care: Yes (Comment) Care giver support system in place?: Yes (comment) Current home services: DME Criminal Activity/Legal Involvement Pertinent to Current Situation/Hospitalization: No - Comment as needed  Activities of Daily Living   ADL Screening (condition at time of admission) Independently performs ADLs?: Yes (appropriate for developmental age) Is the patient deaf or have difficulty hearing?: No Does the patient have difficulty seeing, even when wearing glasses/contacts?: No Does the patient have difficulty concentrating, remembering, or making decisions?: No  Permission Sought/Granted                   Emotional Assessment Appearance:: Appears stated age, Well-Groomed     Orientation: : Oriented to Self, Oriented to Place, Oriented to  Time, Oriented to Situation Alcohol / Substance Use: Not Applicable Psych Involvement: No (comment)  Admission diagnosis:  Dizziness [R42] Hyponatremia [E87.1] AKI (acute kidney injury) [N17.9] Fall, initial encounter [W19.XXXA] Diarrhea, unspecified type [R19.7] Patient Active Problem List   Diagnosis Date Noted   Hypothyroidism 10/07/2023   AKI (acute kidney injury) 10/06/2023   Hyponatremia 10/06/2023   C. 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 [place] [address] Phone: [phone] Fax: [phone]  [facility] [id number] [place], [place] - [address] [place] [address] Phone: [phone] Fax: [phone]     Social Drivers of Health (SDOH) Social History: SDOH Screenings   Food Insecurity: No Food Insecurity (10/06/2023)  Housing: Low Risk  (10/06/2023)  Transportation Needs: No  Transportation Needs (10/06/2023)  Utilities: Not At Risk (10/06/2023)  Financial Resource Strain: Medium Risk (07/25/2023)   Received from [facility]  Social Connections: Socially Integrated (10/06/2023)  Tobacco Use: Low Risk  (10/06/2023)   SDOH Interventions:     Readmission Risk Interventions     No data to display

## 2023-10-08 NOTE — Plan of Care (Signed)

## 2023-10-08 NOTE — Plan of Care (Signed)
  Problem: Education: Goal: Knowledge of General Education information will improve Description: Including pain rating scale, medication(s)/side effects and non-pharmacologic comfort measures Outcome: Adequate for Discharge   Problem: Health Behavior/Discharge Planning: Goal: Ability to manage health-related needs will improve Outcome: Adequate for Discharge   Problem: Clinical Measurements: Goal: Ability to maintain clinical measurements within normal limits will improve Outcome: Adequate for Discharge Goal: Will remain free from infection Outcome: Adequate for Discharge Goal: Diagnostic test results will improve Outcome: Adequate for Discharge Goal: Respiratory complications will improve Outcome: Adequate for Discharge Goal: Cardiovascular complication will be avoided Outcome: Adequate for Discharge   Problem: Activity: Goal: Risk for activity intolerance will decrease Outcome: Adequate for Discharge   Problem: Nutrition: Goal: Adequate nutrition will be maintained Outcome: Adequate for Discharge   Problem: Coping: Goal: Level of anxiety will decrease Outcome: Adequate for Discharge   Problem: Elimination: Goal: Will not experience complications related to bowel motility Outcome: Adequate for Discharge Goal: Will not experience complications related to urinary retention Outcome: Adequate for Discharge   Problem: Pain Managment: Goal: General experience of comfort will improve and/or be controlled Outcome: Adequate for Discharge   Problem: Safety: Goal: Ability to remain free from injury will improve Outcome: Adequate for Discharge   Problem: Skin Integrity: Goal: Risk for impaired skin integrity will decrease Outcome: Adequate for Discharge   Problem: Education: Goal: Knowledge of General Education information will improve Description: Including pain rating scale, medication(s)/side effects and non-pharmacologic comfort measures Outcome: Adequate for  Discharge   Problem: Health Behavior/Discharge Planning: Goal: Ability to manage health-related needs will improve Outcome: Adequate for Discharge   Problem: Clinical Measurements: Goal: Ability to maintain clinical measurements within normal limits will improve Outcome: Adequate for Discharge Goal: Will remain free from infection Outcome: Adequate for Discharge Goal: Diagnostic test results will improve Outcome: Adequate for Discharge Goal: Respiratory complications will improve Outcome: Adequate for Discharge Goal: Cardiovascular complication will be avoided Outcome: Adequate for Discharge   Problem: Activity: Goal: Risk for activity intolerance will decrease Outcome: Adequate for Discharge   Problem: Nutrition: Goal: Adequate nutrition will be maintained Outcome: Adequate for Discharge   Problem: Coping: Goal: Level of anxiety will decrease Outcome: Adequate for Discharge   Problem: Elimination: Goal: Will not experience complications related to bowel motility Outcome: Adequate for Discharge Goal: Will not experience complications related to urinary retention Outcome: Adequate for Discharge   Problem: Pain Managment: Goal: General experience of comfort will improve and/or be controlled Outcome: Adequate for Discharge   Problem: Safety: Goal: Ability to remain free from injury will improve Outcome: Adequate for Discharge   Problem: Skin Integrity: Goal: Risk for impaired skin integrity will decrease Outcome: Adequate for Discharge

## 2023-10-09 ENCOUNTER — Other Ambulatory Visit: Payer: Self-pay

## 2023-10-09 ENCOUNTER — Ambulatory Visit: Admitting: Dermatology

## 2023-10-10 ENCOUNTER — Other Ambulatory Visit: Payer: Self-pay

## 2023-10-10 ENCOUNTER — Other Ambulatory Visit (HOSPITAL_COMMUNITY): Payer: Self-pay

## 2023-10-11 ENCOUNTER — Other Ambulatory Visit (HOSPITAL_COMMUNITY): Payer: Self-pay

## 2023-10-11 ENCOUNTER — Other Ambulatory Visit (HOSPITAL_BASED_OUTPATIENT_CLINIC_OR_DEPARTMENT_OTHER): Payer: Self-pay

## 2023-10-11 ENCOUNTER — Other Ambulatory Visit: Payer: Self-pay

## 2023-10-12 ENCOUNTER — Ambulatory Visit: Admitting: Dermatology

## 2023-10-12 ENCOUNTER — Encounter: Payer: Self-pay | Admitting: Dermatology

## 2023-10-12 DIAGNOSIS — T1490XD Injury, unspecified, subsequent encounter: Secondary | ICD-10-CM

## 2023-10-12 DIAGNOSIS — A0472 Enterocolitis due to Clostridium difficile, not specified as recurrent: Secondary | ICD-10-CM

## 2023-10-12 DIAGNOSIS — C4492 Squamous cell carcinoma of skin, unspecified: Secondary | ICD-10-CM

## 2023-10-12 NOTE — Patient Instructions (Signed)

## 2023-10-12 NOTE — Progress Notes (Addendum)
   Follow Up Visit   Subjective  Sheryl Kennedy is a 78 y.o. female who presents for the following: follow up from Mohs surgery   The patient presents for follow up from Mohs surgery for a SCC on the right crown scalp, treated on 09/12/23, repaired with 2nd intention. The patient has been bandaging the wound as directed. The endorse the following concerns: none.  She is accompanied by her daughter. Per the patient and her daughter, the patient has been experiencing illness recently and is currently undergoing treatment for C Diff.   The following portions of the chart were reviewed this encounter and updated as appropriate: medications, allergies, medical history  Review of Systems:  No other skin or systemic complaints except as noted in HPI or Assessment and Plan.  Objective  Well appearing patient in no apparent distress; mood and affect are within normal limits.  A focal examination was performed including scalp, head, face and scalp All findings within normal limits unless otherwise noted below.  Healing wound with mild erythema  Relevant physical exam findings are noted in the Assessment and Plan.    Assessment & Plan   Healing Wound s/p Mohs for SCC on the scalp, treated on 09/12/23, repaired with 2nd intention and delayed purse string - Delayed wound healing occurring, could be due to concurrent C diff infection and other systemic illnesses - Recommend continuing with Bolstered bandages to continue to try to promote wound healing - Will add donated Amnion Allograft today   HISTORY OF SQUAMOUS CELL CARCINOMA OF THE SKIN - No evidence of recurrence today - No lymphadenopathy - Recommend regular full body skin exams - Recommend daily broad spectrum sunscreen SPF 30+ to sun-exposed areas, reapply every 2 hours as needed.  - Call if any new or changing lesions are noted between office visits  Wound Bed Preparation  The patient is being prepared for an allograft placement. The  wound bed is thoroughly assessed and prepared to optimize graft take and healing. The necrotic tissue is debrided with a curette to create a clean, viable base, and any infected tissue is removed to reduce the risk of graft failure. Hemostasis is achieved to prevent bleeding complications during the grafting process. The wound edges are carefully undermined, and the underlying tissues are inspected for adequate vascularity to support the graft. The wound is then thoroughly irrigated, and the area is prepped with antiseptic solutions to minimize the risk of infection. Once the wound bed is adequately prepared, it is covered with a moist dressing in preparation for the skin graft placement.  Wound size 2.2 x 2.7 cm  Donated Organogenesis applied Via Matrix Amniotic Allograft [id number] Exp 08/17/2028  Active C- Diff Infection - Currently on fidaxomicin  - Recommend quarantine until infection clears  Return in about 1 week (around 10/19/2023) for wound check/suture removal.  I, Darice Smock, CMA, am acting as scribe for RUFUS CHRISTELLA HOLY, MD.   Documentation: I have reviewed the above documentation for accuracy and completeness, and I agree with the above.  RUFUS CHRISTELLA HOLY, MD

## 2023-10-16 ENCOUNTER — Ambulatory Visit: Admitting: Dermatology

## 2023-10-19 ENCOUNTER — Encounter: Payer: Self-pay | Admitting: Dermatology

## 2023-10-19 ENCOUNTER — Ambulatory Visit: Admitting: Dermatology

## 2023-10-19 DIAGNOSIS — A0472 Enterocolitis due to Clostridium difficile, not specified as recurrent: Secondary | ICD-10-CM

## 2023-10-19 DIAGNOSIS — C4492 Squamous cell carcinoma of skin, unspecified: Secondary | ICD-10-CM

## 2023-10-19 DIAGNOSIS — T1490XD Injury, unspecified, subsequent encounter: Secondary | ICD-10-CM

## 2023-10-19 NOTE — Patient Instructions (Addendum)
 [phone]  Important Information   Due to recent changes in healthcare laws, you may see results of your pathology and/or laboratory studies on MyChart before the doctors have had a chance to review them. We understand that in some cases there may be results that are confusing or concerning to you. Please understand that not all results are received at the same time and often the doctors may need to interpret multiple results in order to provide you with the best plan of care or course of treatment. Therefore, we ask that you please give us  2 business days to thoroughly review all your results before contacting the office for clarification. Should we see a critical lab result, you will be contacted sooner.     If You Need Anything After Your Visit   If you have any questions or concerns for your doctor, please call our main line at [phone]. If no one answers, please leave a voicemail as directed and we will return your call as soon as possible. Messages left after 4 pm will be answered the following business day.    You may also send us  a message via MyChart. We typically respond to MyChart messages within 1-2 business days.  For prescription refills, please ask your pharmacy to contact our office. Our fax number is [phone].  If you have an urgent issue when the clinic is closed that cannot wait until the next business day, you can page your doctor at the number below.     Please note that while we do our best to be available for urgent issues outside of office hours, we are not available 24/7.    If you have an urgent issue and are unable to reach us , you may choose to seek medical care at your doctor's office, retail clinic, urgent care center, or emergency room.   If you have a medical emergency, please immediately call 911 or go to the emergency department. In the event of inclement weather, please call our main line at [phone] for an update on the status of any delays  or closures.  Dermatology Medication Tips: Please keep the boxes that topical medications come in in order to help keep track of the instructions about where and how to use these. Pharmacies typically print the medication instructions only on the boxes and not directly on the medication tubes.   If your medication is too expensive, please contact our office at [phone] or send us  a message through MyChart.    We are unable to tell what your co-pay for medications will be in advance as this is different depending on your insurance coverage. However, we may be able to find a substitute medication at lower cost or fill out paperwork to get insurance to cover a needed medication.    If a prior authorization is required to get your medication covered by your insurance company, please allow us  1-2 business days to complete this process.   Drug prices often vary depending on where the prescription is filled and some pharmacies may offer cheaper prices.   The website www.goodrx.com contains coupons for medications through different pharmacies. The prices here do not account for what the cost may be with help from insurance (it may be cheaper with your insurance), but the website can give you the price if you did not use any insurance.  - You can print the associated coupon and take it with your prescription to the pharmacy.  - You may also stop by our  office during regular business hours and pick up a GoodRx coupon card.  - If you need your prescription sent electronically to a different pharmacy, notify our office through [facility] or by phone at [phone]

## 2023-10-19 NOTE — Progress Notes (Addendum)
° °  Follow Up Visit   Subjective  Sheryl Kennedy is a 78 y.o. female who presents for the following: follow up from Mohs surgery   The patient presents for follow up from Mohs surgery for a SCC on the right crown scalp, treated on 09/12/23, repaired with 2nd intention. The patient has been bandaging the wound as directed. The endorse the following concerns: none  She states that she feels better from her C Diff infection is still on fidaxomicin . She states that she has not had diarrhea in at least a week.    The following portions of the chart were reviewed this encounter and updated as appropriate: medications, allergies, medical history  Review of Systems:  No other skin or systemic complaints except as noted in HPI or Assessment and Plan.  Objective  Well appearing patient in no apparent distress; mood and affect are within normal limits.  A focal examination was performed including scalp, head, face and scalp All findings within normal limits unless otherwise noted below.  Healing wound with mild erythema  Relevant physical exam findings are noted in the Assessment and Plan.    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 Montorfano from Plastic Surgery to discuss skin substitute vs graft with him  - Referral to plastic surgery placed  Wound Bed Preparation The patient is being prepared for a potential allograft placement or delayed skin graft. The wound bed is thoroughly assessed and prepared to optimize graft take and healing. The necrotic tissue is debrided with a  curette to create a clean, viable base, and any infected tissue is removed to reduce the risk of graft failure. Hemostasis is achieved to prevent bleeding complications during the grafting process. The wound edges are carefully undermined, and the underlying tissues are inspected for adequate vascularity to support the graft. The wound is then thoroughly irrigated, and the area is prepped with antiseptic solutions to minimize the risk of infection. Once the wound bed is adequately prepared, it is covered with a moist dressing in preparation for the skin graft placement.  Wound size 2.0 x 2.5 cm   Active C- Diff Infection - Currently on fidaxomicin  - Recommend quarantine until infection clears   Return in about 1 week (around 10/26/2023) for wound check.  I, Darice Smock, CMA, am acting as scribe for RUFUS CHRISTELLA HOLY, MD.   Documentation: I have reviewed the above documentation for accuracy and completeness, and I agree with the above.  RUFUS CHRISTELLA HOLY, MD

## 2023-10-24 ENCOUNTER — Ambulatory Visit

## 2023-10-24 ENCOUNTER — Encounter

## 2023-10-24 VITALS — BP 153/72 | HR 75 | Ht 60.0 in | Wt 130.6 lb

## 2023-10-24 DIAGNOSIS — Z85828 Personal history of other malignant neoplasm of skin: Secondary | ICD-10-CM | POA: Diagnosis not present

## 2023-10-24 DIAGNOSIS — Z9889 Other specified postprocedural states: Secondary | ICD-10-CM

## 2023-10-24 DIAGNOSIS — S0100XD Unspecified open wound of scalp, subsequent encounter: Secondary | ICD-10-CM

## 2023-10-24 NOTE — Progress Notes (Signed)
 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 She has been bandaging the wound as directed and denies any concerns related to the surgical site, including pain, drainage, or signs of infection. She reports improvement in her recent C. difficile infection and remains on fidaxomicin. She notes no episodes of diarrhea for at least one week. Very minimal granulation tissue noted despite multiple attempts to promote healing, including amnion allograft placement at one of the visits with Dr. Corey. Likely delayed wound healing multifactorial.  Patient is a non-smoker.  Patient was referred to me by Dr. Paci for management of chronic scalp wound.  Patient comes today accompanied by daughter.  I introduced myself as Dr.Lilton Pare M Aleza Pew.  PMH:  Past Medical History:  Diagnosis Date   Arthritis    Cancer (HCC) 08/2012   skin right anterior lower leg, squamous cell   DSAP (disseminated superficial actinic porokeratosis)    Gout    Hx of seasonal allergies    Hyperlipidemia    Hypertension    SCC (squamous cell carcinoma) 07/31/2023   superfical infiltration, R crown scalp, referral to Dr. Corey for [facility]   SCC (squamous cell carcinoma) 07/31/2023   R lateral lower leg, EDC 09/11/23   Squamous cell carcinoma of skin    R anterior lower leg   Squamous cell carcinoma of skin 12/12/2022   SCC/KA type. Left forearm. Excision 02/01/2023   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 complications      PSH:  Past Surgical History:  Procedure  Laterality Date   BACK SURGERY  2008   no metal in back   BREAST BIOPSY Right 09/13/2010   core/neg   BREAST BIOPSY Right 12/09/2015   us  biopsy/neg   BREAST SURGERY Right 2012   CATARACT EXTRACTION W/PHACO Left 04/25/2022   Procedure: CATARACT EXTRACTION PHACO AND INTRAOCULAR LENS PLACEMENT (IOC) LEFT 3.42 00:25.2;  Surgeon: Myrna Adine Anes, MD;  Location: [facility];  Service: Ophthalmology;  Laterality: Left;   CATARACT EXTRACTION W/PHACO Right 05/23/2022   Procedure: CATARACT EXTRACTION PHACO AND INTRAOCULAR LENS PLACEMENT (IOC) RIGHT 3.84 00:28.1;  Surgeon: Myrna Adine Anes, MD;  Location: [facility];  Service: Ophthalmology;  Laterality: Right;   CHONDROPLASTY Left 01/22/2016   Procedure: arthroscopic medial AND LATERAL CHONDROPLASTY;  Surgeon: Lynwood SHAUNNA Hue, MD;  Location: [facility] ORS;  Service: Orthopedics;  Laterality: Left;   COLONOSCOPY  2011   COLONOSCOPY WITH PROPOFOL  N/A 04/14/2015   Procedure: COLONOSCOPY WITH PROPOFOL ;  Surgeon: Gladis RAYMOND Mariner, MD;  Location: [facility] END[facility];  Service: Endoscopy;  Laterality: N/A;   CYST REMOVAL HAND Left 2012   JOINT REPLACEMENT Right 09/30/2013   TOTAL KNEE REPLACEMENT, DR. HUE, [facility]   KNEE ARTHROSCOPY WITH MEDIAL MENISECTOMY Left 01/22/2016   Procedure: KNEE ARTHROSCOPY WITH MEDIAL AND LATERAL MENISECTOMY;  Surgeon: Lynwood SHAUNNA Hue, MD;  Location: [facility] OR[facility];  Service: Orthopedics;  Laterality: Left;   KNEE SURGERY  1997   LEFT HEART CATH AND CORONARY ANGIOGRAPHY N/A 10/25/2021   Procedure: LEFT HEART CATH AND CORONARY ANGIOGRAPHY;  Surgeon: Mady Bruckner, MD;  Location: [facility] INVASIVE CV LAB;  Service: Cardiovascular;  Laterality: N/A;   Stab pheblectomy  Left 2013   TONSILLECTOMY     VEIN CLOSURE Bilateral 2012     MEDICATIONS:   Current Outpatient Medications:    allopurinol  (ZYLOPRIM ) 300 MG tablet, Take 300 mg by mouth daily., Disp: , Rfl:    aspirin  EC 81 MG tablet, Take 81 mg by mouth daily., Disp: ,  Rfl:    Cholecalciferol (VITAMIN D3) 25 MCG (1000 UT) CAPS, Take by mouth., Disp: , Rfl:    Cyanocobalamin  1000 MCG TBCR, Take 1 tablet by mouth daily. , Disp: , Rfl:    fexofenadine (ALLEGRA) 180 MG tablet, Take 180 mg by mouth daily as needed for allergies., Disp: , Rfl:    fidaxomicin (DIFICID) 200 MG TABS tablet, Take 1 tablet (200 mg total) by mouth 2 (two) times daily for 3 days, THEN 1 tablet (200 mg total) every other day for 15 days., Disp: 13 tablet, Rfl: 0   fluticasone (FLONASE) 50 MCG/ACT nasal spray, Place 1 spray into both nostrils daily as needed for allergies or rhinitis., Disp: , Rfl:    ketoconazole  (NIZORAL ) 2 % shampoo, WASH SCALP 2-3 TIMES WEEKLY, LET SIT 5 MINUTES AND RINSE, Disp: 120 mL, Rfl: 3   levothyroxine  (SYNTHROID ) 50 MCG tablet, Take 50 mcg by mouth daily before breakfast., Disp: , Rfl:    metoprolol succinate (TOPROL-XL) 25 MG 24 hr tablet, Take 25 mg by mouth daily. , Disp: , Rfl:    Nutritional Supplements (ESTROVEN PM PO), Take 0.5 tablets by mouth at bedtime. For hotflashes, Disp: , Rfl:    potassium chloride  SA (K-DUR,KLOR-CON ) 20 MEQ tablet, Take 20 mEq by mouth 3 (three) times daily. , Disp: , Rfl:    simvastatin  (ZOCOR ) 20 MG tablet, Take 20 mg by mouth at bedtime., Disp: , Rfl:    [Paused] spironolactone-hydrochlorothiazide (ALDACTAZIDE) 25-25 MG tablet, Take 1 tablet by mouth daily., Disp: , Rfl:    tiZANidine (ZANAFLEX) 2 MG tablet, Take 2 mg by mouth 3 (three) times daily as needed., Disp: , Rfl:    torsemide (DEMADEX) 20 MG tablet, Take 20 mg by mouth daily. , Disp: , Rfl:    traMADol  (ULTRAM ) 50 MG tablet, Take 50 mg by mouth every 6 (six) hours as needed for moderate pain (pain score 4-6)., Disp: , Rfl:    traZODone (DESYREL) 50 MG tablet, Take 25 mg by mouth at bedtime., Disp: , Rfl:    clobetasol  (TEMOVATE ) 0.05 % external solution, APPLY TOPICALLY TO SCALP IN THE MORNING AFTER SHAMPOO FOR ITCHY SCALP (Patient not taking: Reported on 10/24/2023),  Disp: 50 mL, Rfl: 0   colchicine  0.6 MG tablet, Take 0.5 tablets (0.3 mg total) by mouth daily for 7 days. (Patient not taking: Reported on 10/24/2023), Disp: 3.5 tablet, Rfl: 0   Fluocinolone  Acetonide Body 0.01 % OIL, Apply topically to itchy scaly spot at scalp cover with shower cap qhs (Patient not taking: Reported on 10/24/2023), Disp: 120 mL, Rfl: 5   mupirocin  ointment (BACTROBAN ) 2 %, APPLY 1 APPLICATION TOPICALLY DAILY (Patient not taking: Reported on 10/24/2023), Disp: 15 g, Rfl: 0   triamcinolone  cream (KENALOG ) 0.1 %, Apply to affected areas rash on body once to twice daily until improved. Avoid face, groin, axilla. (Patient not taking: Reported on 10/24/2023), Disp: 80 g, Rfl: 2   ALLERGIES:  is allergic to naproxen, pseudoephedrine, shellfish allergy, and sulfa antibiotics.   FAMILY HISTORY:  Family History  Problem Relation Age of Onset   Heart failure Mother    Heart disease Mother    Arthritis Mother    Colon cancer Paternal Grandmother    Breast cancer Neg Hx       VITALS: MA as chaperone  Vitals:   10/24/23 0957  BP: (!) 153/72  Pulse: 75  SpO2: 100%    Constitutional: Good color, good hydration. VSS. Head and Neck: No lymphadenopathy.  Chest: Normal breathing, Normal shape and excursion.  Scalp: Bolster noticed at the vertex of the scalp.  The size of the bolster is approximately 3 x 3 cm.  Small satellite lesions/wounds noticed as well. Currently not infected. No basin lymphadenopathy.    ASSESSMENT/PLAN  Assessment & Plan   Pt. presents with a chronic wound at the vertex of the scalp status post Mohs surgery for squamous cell carcinoma.  Today, we discussed the risks, benefits, and alternatives of proceeding with skin substitute placement on the wound and possible local tissue rearrangement. Alternatives include continued observation; however, given the delayed progression of secondary intention healing and multiple debridements, I do not believe  the wounds will heal appropriately without further intervention. The benefits of proceeding with skin substitutes and local tissue rearrangement include improved healing potential and the likelihood of complete wound closure. The risks discussed include seroma, hematoma, infection, bleeding, alopecia, injury to surrounding healthy tissue and structures, failure of the skin substitutes, need for additional surgery, wound separation, damage to surrounding structures and recurrence of the wounds. Scar patterns and the expected postoperative course were also reviewed in detail with the patient. Risks of anesthesia were addressed briefly and will be discussed further by the anesthesia team.  The patient expressed good understanding of the risks, benefits, and postoperative care, and all questions were answered. The plan is to proceed with operative closure of the wound in the operating room. Precertification and scheduling will be initiated following primary care clearance. The planned operative procedure includes skin substitute placement with possible local tissue rearrangement.  Patient understand that for this I need to bur the cortical bone of the scalp.  The patient and her daughter understand the risks and benefits of this operation.  Patient willing to stay for 23-hour observation.  Everette Dimauro M. Daijah Scrivens, MD [facility]

## 2023-10-24 NOTE — Addendum Note (Signed)
 Addended by: EUSTACIO POUR on: 10/24/2023 01:06 PM   Modules accepted: Orders

## 2023-10-26 ENCOUNTER — Inpatient Hospital Stay (HOSPITAL_COMMUNITY)
Admission: EM | Admit: 2023-10-26 | Discharge: 2023-10-29 | DRG: 863 | Disposition: A | Discharge status: Home / Self Care | Source: Ambulatory Visit | Attending: Internal Medicine | Admitting: Internal Medicine

## 2023-10-26 ENCOUNTER — Encounter (HOSPITAL_COMMUNITY): Payer: Self-pay | Admitting: Internal Medicine

## 2023-10-26 ENCOUNTER — Other Ambulatory Visit: Payer: Self-pay

## 2023-10-26 ENCOUNTER — Emergency Department (HOSPITAL_COMMUNITY)

## 2023-10-26 ENCOUNTER — Ambulatory Visit (INDEPENDENT_AMBULATORY_CARE_PROVIDER_SITE_OTHER): Admitting: Dermatology

## 2023-10-26 DIAGNOSIS — Z888 Allergy status to other drugs, medicaments and biological substances status: Secondary | ICD-10-CM

## 2023-10-26 DIAGNOSIS — E038 Other specified hypothyroidism: Secondary | ICD-10-CM

## 2023-10-26 DIAGNOSIS — Z7982 Long term (current) use of aspirin: Secondary | ICD-10-CM

## 2023-10-26 DIAGNOSIS — Z961 Presence of intraocular lens: Secondary | ICD-10-CM | POA: Diagnosis present

## 2023-10-26 DIAGNOSIS — Z882 Allergy status to sulfonamides status: Secondary | ICD-10-CM

## 2023-10-26 DIAGNOSIS — E7849 Other hyperlipidemia: Secondary | ICD-10-CM | POA: Diagnosis not present

## 2023-10-26 DIAGNOSIS — I10 Essential (primary) hypertension: Secondary | ICD-10-CM | POA: Diagnosis not present

## 2023-10-26 DIAGNOSIS — I129 Hypertensive chronic kidney disease with stage 1 through stage 4 chronic kidney disease, or unspecified chronic kidney disease: Secondary | ICD-10-CM | POA: Diagnosis present

## 2023-10-26 DIAGNOSIS — N1832 Chronic kidney disease, stage 3b: Secondary | ICD-10-CM | POA: Diagnosis present

## 2023-10-26 DIAGNOSIS — S0100XA Unspecified open wound of scalp, initial encounter: Secondary | ICD-10-CM | POA: Diagnosis not present

## 2023-10-26 DIAGNOSIS — Z79890 Hormone replacement therapy (postmenopausal): Secondary | ICD-10-CM | POA: Diagnosis not present

## 2023-10-26 DIAGNOSIS — M109 Gout, unspecified: Secondary | ICD-10-CM | POA: Diagnosis present

## 2023-10-26 DIAGNOSIS — Z59868 Other specified financial insecurity: Secondary | ICD-10-CM | POA: Diagnosis not present

## 2023-10-26 DIAGNOSIS — S0100XD Unspecified open wound of scalp, subsequent encounter: Secondary | ICD-10-CM | POA: Diagnosis not present

## 2023-10-26 DIAGNOSIS — L089 Local infection of the skin and subcutaneous tissue, unspecified: Principal | ICD-10-CM

## 2023-10-26 DIAGNOSIS — Z79899 Other long term (current) drug therapy: Secondary | ICD-10-CM | POA: Diagnosis not present

## 2023-10-26 DIAGNOSIS — Z85828 Personal history of other malignant neoplasm of skin: Secondary | ICD-10-CM

## 2023-10-26 DIAGNOSIS — Z96653 Presence of artificial knee joint, bilateral: Secondary | ICD-10-CM | POA: Diagnosis present

## 2023-10-26 DIAGNOSIS — Z9842 Cataract extraction status, left eye: Secondary | ICD-10-CM

## 2023-10-26 DIAGNOSIS — Y838 Other surgical procedures as the cause of abnormal reaction of the patient, or of later complication, without mention of misadventure at the time of the procedure: Secondary | ICD-10-CM | POA: Diagnosis present

## 2023-10-26 DIAGNOSIS — G8929 Other chronic pain: Secondary | ICD-10-CM | POA: Diagnosis present

## 2023-10-26 DIAGNOSIS — C4492 Squamous cell carcinoma of skin, unspecified: Secondary | ICD-10-CM

## 2023-10-26 DIAGNOSIS — T8141XA Infection following a procedure, superficial incisional surgical site, initial encounter: Secondary | ICD-10-CM | POA: Diagnosis present

## 2023-10-26 DIAGNOSIS — Z8249 Family history of ischemic heart disease and other diseases of the circulatory system: Secondary | ICD-10-CM | POA: Diagnosis not present

## 2023-10-26 DIAGNOSIS — T8189XD Other complications of procedures, not elsewhere classified, subsequent encounter: Secondary | ICD-10-CM

## 2023-10-26 DIAGNOSIS — D044 Carcinoma in situ of skin of scalp and neck: Secondary | ICD-10-CM | POA: Diagnosis not present

## 2023-10-26 DIAGNOSIS — L03811 Cellulitis of head [any part, except face]: Secondary | ICD-10-CM | POA: Diagnosis present

## 2023-10-26 DIAGNOSIS — Z881 Allergy status to other antibiotic agents status: Secondary | ICD-10-CM

## 2023-10-26 DIAGNOSIS — Z91013 Allergy to seafood: Secondary | ICD-10-CM | POA: Diagnosis not present

## 2023-10-26 DIAGNOSIS — Z886 Allergy status to analgesic agent status: Secondary | ICD-10-CM | POA: Diagnosis not present

## 2023-10-26 DIAGNOSIS — C4442 Squamous cell carcinoma of skin of scalp and neck: Secondary | ICD-10-CM

## 2023-10-26 DIAGNOSIS — E785 Hyperlipidemia, unspecified: Secondary | ICD-10-CM | POA: Diagnosis present

## 2023-10-26 DIAGNOSIS — A0472 Enterocolitis due to Clostridium difficile, not specified as recurrent: Secondary | ICD-10-CM | POA: Diagnosis present

## 2023-10-26 DIAGNOSIS — E871 Hypo-osmolality and hyponatremia: Secondary | ICD-10-CM | POA: Diagnosis present

## 2023-10-26 DIAGNOSIS — E039 Hypothyroidism, unspecified: Secondary | ICD-10-CM | POA: Diagnosis present

## 2023-10-26 DIAGNOSIS — D099 Carcinoma in situ, unspecified: Secondary | ICD-10-CM | POA: Diagnosis present

## 2023-10-26 DIAGNOSIS — Z9841 Cataract extraction status, right eye: Secondary | ICD-10-CM

## 2023-10-26 DIAGNOSIS — A0471 Enterocolitis due to Clostridium difficile, recurrent: Secondary | ICD-10-CM | POA: Diagnosis not present

## 2023-10-26 DIAGNOSIS — G4700 Insomnia, unspecified: Secondary | ICD-10-CM | POA: Diagnosis present

## 2023-10-26 DIAGNOSIS — T1490XD Injury, unspecified, subsequent encounter: Secondary | ICD-10-CM

## 2023-10-26 LAB — CBC WITH DIFFERENTIAL/PLATELET
Abs Immature Granulocytes: 0.1 K/uL — ABNORMAL HIGH (ref 0.00–0.07)
Basophils Absolute: 0.1 K/uL (ref 0.0–0.1)
Basophils Relative: 1 %
Eosinophils Absolute: 0.4 K/uL (ref 0.0–0.5)
Eosinophils Relative: 5 %
HCT: 39.5 % (ref 36.0–46.0)
Hemoglobin: 12.6 g/dL (ref 12.0–15.0)
Immature Granulocytes: 1 %
Lymphocytes Relative: 21 %
Lymphs Abs: 1.7 K/uL (ref 0.7–4.0)
MCH: 30.4 pg (ref 26.0–34.0)
MCHC: 31.9 g/dL (ref 30.0–36.0)
MCV: 95.2 fL (ref 80.0–100.0)
Monocytes Absolute: 0.8 K/uL (ref 0.1–1.0)
Monocytes Relative: 11 %
Neutro Abs: 4.8 K/uL (ref 1.7–7.7)
Neutrophils Relative %: 61 %
Platelets: 360 K/uL (ref 150–400)
RBC: 4.15 MIL/uL (ref 3.87–5.11)
RDW: 16 % — ABNORMAL HIGH (ref 11.5–15.5)
WBC: 7.8 K/uL (ref 4.0–10.5)
nRBC: 0 % (ref 0.0–0.2)

## 2023-10-26 LAB — BASIC METABOLIC PANEL WITH GFR
Anion gap: 11 (ref 5–15)
BUN: 16 mg/dL (ref 8–23)
CO2: 29 mmol/L (ref 22–32)
Calcium: 10.5 mg/dL — ABNORMAL HIGH (ref 8.9–10.3)
Chloride: 94 mmol/L — ABNORMAL LOW (ref 98–111)
Creatinine, Ser: 1.16 mg/dL — ABNORMAL HIGH (ref 0.44–1.00)
GFR, Estimated: 48 mL/min — ABNORMAL LOW (ref >60)
Glucose, Bld: 135 mg/dL — ABNORMAL HIGH (ref 70–99)
Potassium: 3.9 mmol/L (ref 3.5–5.1)
Sodium: 135 mmol/L (ref 135–145)

## 2023-10-26 LAB — I-STAT CG4 LACTIC ACID, ED: Lactic Acid, Venous: 1.6 mmol/L (ref 0.5–1.9)

## 2023-10-26 MED ORDER — ONDANSETRON HCL 4 MG PO TABS: 4.0000 mg | ORAL_TABLET | Freq: Four times a day (QID) | ORAL | Status: DC | PRN

## 2023-10-26 MED ORDER — SODIUM CHLORIDE 0.9% FLUSH
3.0000 mL | Freq: Two times a day (BID) | INTRAVENOUS | Status: DC
  Administered 2023-10-26 – 2023-10-29 (×4): 3 mL via INTRAVENOUS

## 2023-10-26 MED ORDER — LEVOTHYROXINE SODIUM 50 MCG PO TABS
50.0000 ug | ORAL_TABLET | Freq: Every day | ORAL | Status: DC
  Administered 2023-10-27 – 2023-10-29 (×3): 50 ug via ORAL
  Filled 2023-10-26 (×3): qty 1

## 2023-10-26 MED ORDER — IOHEXOL 300 MG/ML  SOLN
60.0000 mL | Freq: Once | INTRAMUSCULAR | Status: AC | PRN
  Administered 2023-10-27: 60 mL via INTRAVENOUS

## 2023-10-26 MED ORDER — ONDANSETRON HCL 4 MG/2ML IJ SOLN: 4.0000 mg | Freq: Four times a day (QID) | INTRAMUSCULAR | Status: DC | PRN

## 2023-10-26 MED ORDER — SODIUM CHLORIDE 0.9 % IV SOLN: 2.0000 g | INTRAVENOUS | Status: DC

## 2023-10-26 MED ORDER — SODIUM CHLORIDE 0.9% FLUSH: 3.0000 mL | INTRAVENOUS | Status: DC | PRN

## 2023-10-26 MED ORDER — VITAMIN B-12 1000 MCG PO TABS
1000.0000 ug | ORAL_TABLET | Freq: Every day | ORAL | Status: DC
  Administered 2023-10-27 – 2023-10-29 (×3): 1000 ug via ORAL
  Filled 2023-10-26 (×3): qty 1

## 2023-10-26 MED ORDER — ALLOPURINOL 300 MG PO TABS
300.0000 mg | ORAL_TABLET | Freq: Every day | ORAL | Status: DC
  Administered 2023-10-27 – 2023-10-29 (×3): 300 mg via ORAL
  Filled 2023-10-26 (×3): qty 1

## 2023-10-26 MED ORDER — ACETAMINOPHEN 650 MG RE SUPP: 650.0000 mg | Freq: Four times a day (QID) | RECTAL | Status: DC | PRN

## 2023-10-26 MED ORDER — ACETAMINOPHEN 500 MG PO TABS
1000.0000 mg | ORAL_TABLET | Freq: Once | ORAL | Status: AC
  Administered 2023-10-26: 1000 mg via ORAL
  Filled 2023-10-26: qty 2

## 2023-10-26 MED ORDER — SODIUM CHLORIDE 0.9 % IV SOLN: 250.0000 mL | INTRAVENOUS | Status: AC | PRN

## 2023-10-26 MED ORDER — SIMVASTATIN 20 MG PO TABS
20.0000 mg | ORAL_TABLET | Freq: Every day | ORAL | Status: DC
  Administered 2023-10-27 – 2023-10-28 (×3): 20 mg via ORAL
  Filled 2023-10-26 (×3): qty 1

## 2023-10-26 MED ORDER — FIDAXOMICIN 200 MG PO TABS: 200.0000 mg | ORAL_TABLET | ORAL | Status: DC

## 2023-10-26 MED ORDER — HEPARIN SODIUM (PORCINE) 5000 UNIT/ML IJ SOLN
5000.0000 [IU] | Freq: Three times a day (TID) | INTRAMUSCULAR | Status: DC
  Administered 2023-10-27 – 2023-10-29 (×7): 5000 [IU] via SUBCUTANEOUS
  Filled 2023-10-26 (×7): qty 1

## 2023-10-26 MED ORDER — SODIUM CHLORIDE 0.9% FLUSH
3.0000 mL | Freq: Two times a day (BID) | INTRAVENOUS | Status: DC
  Administered 2023-10-26 – 2023-10-29 (×4): 3 mL via INTRAVENOUS

## 2023-10-26 MED ORDER — ACETAMINOPHEN 325 MG PO TABS
650.0000 mg | ORAL_TABLET | Freq: Four times a day (QID) | ORAL | Status: DC | PRN
  Administered 2023-10-27 (×2): 650 mg via ORAL
  Filled 2023-10-26 (×2): qty 2

## 2023-10-26 MED ORDER — VANCOMYCIN HCL IN DEXTROSE 1-5 GM/200ML-% IV SOLN
1000.0000 mg | Freq: Once | INTRAVENOUS | Status: AC
  Administered 2023-10-27: 1000 mg via INTRAVENOUS
  Filled 2023-10-26 (×2): qty 200

## 2023-10-26 MED ORDER — METOPROLOL SUCCINATE ER 25 MG PO TB24
25.0000 mg | ORAL_TABLET | Freq: Every day | ORAL | Status: DC
  Administered 2023-10-28 – 2023-10-29 (×2): 25 mg via ORAL
  Filled 2023-10-26 (×3): qty 1

## 2023-10-26 MED ORDER — TRAZODONE HCL 50 MG PO TABS
25.0000 mg | ORAL_TABLET | Freq: Every day | ORAL | Status: DC
  Administered 2023-10-27 – 2023-10-28 (×2): 25 mg via ORAL
  Filled 2023-10-26 (×2): qty 1

## 2023-10-26 NOTE — ED Triage Notes (Signed)
 Pt sent by dermatologist for possible infection to surgical site- pt had skin cancer removed from the top of her head. Pt is on abx for C-diff currently.

## 2023-10-26 NOTE — Progress Notes (Signed)
 Pharmacy Antibiotic Note  Sheryl Kennedy is a 78 y.o. female admitted on 10/26/2023 with scalp cellulitis.  PMH significant for squamous cell carcinoma involving the scalp.  S/P Mohs surgery Sept 2025 with poor healing wound.  Pharmacy has been consulted for Vancomycin dosing.  Plan: Vancomycin 1000 mg IV Q 36 hrs. Goal AUC 400-550.  Expected AUC: 546.6  SCr used: 1.16 Follow renal function F/u culture results & sensitivities    Temp (24hrs), Avg:98.5 F (36.9 C), Min:98.3 F (36.8 C), Max:98.7 F (37.1 C)  Recent Labs  Lab 10/26/23 2214 10/26/23 2222  WBC 7.8  --   CREATININE 1.16*  --   LATICACIDVEN  --  1.6    Estimated Creatinine Clearance: 32.7 mL/min (A) (by C-G formula based on SCr of 1.16 mg/dL (H)).    Allergies  Allergen Reactions   Erythromycin Other (See Comments)   Levofloxacin     Other Reaction(s): Dizziness   Naproxen Swelling    In hands and joints   Pseudoephedrine Rash and Other (See Comments)    Rhinitis and sneezing Rhinitis and sneezing Rhinitis and sneezing   Shellfish Allergy Other (See Comments) and Dermatitis    Joint swelling, and gout   Sulfa Antibiotics Rash    Antimicrobials this admission: 10/24 Vancomycin >>      Dose adjustments this admission:    Microbiology results: 10/24 BCx:      Thank you for allowing pharmacy to be a part of this patient's care.  Arvin Gauss, PharmD 10/26/2023 11:57 PM

## 2023-10-26 NOTE — ED Provider Notes (Signed)
 [place] EMERGENCY DEPARTMENT AT [facility] Provider Note   CSN: [id number] Arrival date & time: 10/26/23  1545     Patient presents with: Wound Infection   Sheryl Kennedy is a 78 y.o. female.   The history is provided by the patient and medical records. No language interpreter was used.     78 year old female significant history of squamous cells carcinoma involving the scalp status post Mohs surgery on 09/12/2023 sent here by dermatologist with concerns of poor healing wound.  History obtained through previous note from dermatology.  Patient had Mohs surgery and is repaired with secondary intention.  She recently developed a C. difficile infection and currently being treated with fidaxomicin with improvement of her symptoms.  Reports she has not had diarrhea for at least a week.  She was recently seen by dermatologist on 10/20 01/2023 with plan for closure of her wound in the operating room however it was noted that that happened very minimal granulation tissue at the site of the wound healing and dermatology was concerned of drug-resistant C. difficile and secondary to surgical wound infection encourage patient to come to the ER for further care.  Patient states for the past 3 days she noticed increasing pain at the surgical site.  She did follow-up with a dermatologist as well as Engineer, petroleum.  Presents patient states that she would likely benefit from surgical management of the wound but she would need to have insurance approval prior to the procedure.  She did follow-up with her dermatologist today who felt patient may benefit from IV antibiotic for her scalp wound and recommend patient to come to the hospital for IV antibiotic treatment.  And patient states her diarrhea has mostly improved, she did have some diarrhea today but unsure it was due to being upset or feeling nervous.  She endorsed pain at the scalp site but denies any subsequent headache no nausea vomiting.  Prior  to Admission medications   Medication Sig Start Date End Date Taking? Authorizing Provider  allopurinol  (ZYLOPRIM ) 300 MG tablet Take 300 mg by mouth daily.    [provider]  aspirin  EC 81 MG tablet Take 81 mg by mouth daily.    [provider]  Cholecalciferol (VITAMIN D3) 25 MCG (1000 UT) CAPS Take by mouth.    [provider]  clobetasol  (TEMOVATE ) 0.05 % external solution APPLY TOPICALLY TO SCALP IN THE MORNING AFTER SHAMPOO FOR ITCHY SCALP Patient not taking: Reported on 10/24/2023 01/05/22   Jackquline Sawyer, MD  colchicine  0.6 MG tablet Take 0.5 tablets (0.3 mg total) by mouth daily for 7 days. Patient not taking: Reported on 10/24/2023 10/27/21 11/03/21  Laurita Pillion, MD  Cyanocobalamin  1000 MCG TBCR Take 1 tablet by mouth daily.     [provider]  fexofenadine (ALLEGRA) 180 MG tablet Take 180 mg by mouth daily as needed for allergies.    [provider]  fidaxomicin (DIFICID) 200 MG TABS tablet Take 1 tablet (200 mg total) by mouth 2 (two) times daily for 3 days, THEN 1 tablet (200 mg total) every other day for 15 days. 10/08/23 10/26/23  Amin, Sumayya, MD  Fluocinolone  Acetonide Body 0.01 % OIL Apply topically to itchy scaly spot at scalp cover with shower cap qhs Patient not taking: Reported on 10/24/2023 03/21/23   Stewart, Tara, MD  fluticasone (FLONASE) 50 MCG/ACT nasal spray Place 1 spray into both nostrils daily as needed for allergies or rhinitis.    [provider]  ketoconazole  (NIZORAL ) 2 % shampoo WASH SCALP 2-3 TIMES WEEKLY, LET SIT 5 MINUTES AND RINSE 08/01/22   Jackquline Sawyer, MD  levothyroxine  (SYNTHROID ) 50 MCG tablet Take 50 mcg by mouth daily before breakfast.    [provider]  metoprolol succinate (TOPROL-XL) 25 MG 24 hr tablet Take 25 mg by mouth daily.  04/26/13   [provider]  mupirocin  ointment (BACTROBAN ) 2 % APPLY 1 APPLICATION TOPICALLY DAILY Patient not taking: Reported on 10/24/2023 09/14/23    Claudene Lehmann, MD  Nutritional Supplements (ESTROVEN PM PO) Take 0.5 tablets by mouth at bedtime. For hotflashes    [provider]  potassium chloride  SA (K-DUR,KLOR-CON ) 20 MEQ tablet Take 20 mEq by mouth 3 (three) times daily.  03/28/13   [provider]  simvastatin  (ZOCOR ) 20 MG tablet Take 20 mg by mouth at bedtime.    [provider]  spironolactone-hydrochlorothiazide (ALDACTAZIDE) 25-25 MG tablet Take 1 tablet by mouth daily.    [provider]  tiZANidine (ZANAFLEX) 2 MG tablet Take 2 mg by mouth 3 (three) times daily as needed. 06/30/23   [provider]  torsemide (DEMADEX) 20 MG tablet Take 20 mg by mouth daily.  04/26/13   [provider]  traMADol  (ULTRAM ) 50 MG tablet Take 50 mg by mouth every 6 (six) hours as needed for moderate pain (pain score 4-6).    [provider]  traZODone (DESYREL) 50 MG tablet Take 25 mg by mouth at bedtime.    [provider]  triamcinolone  cream (KENALOG ) 0.1 % Apply to affected areas rash on body once to twice daily until improved. Avoid face, groin, axilla. Patient not taking: Reported on 10/24/2023 04/13/22   Jackquline Sawyer, MD    Allergies: Erythromycin, Levofloxacin, Naproxen, Pseudoephedrine, Shellfish allergy, and Sulfa antibiotics    Review of Systems  All other systems reviewed and are negative.   Updated Vital Signs BP (!) 152/98 (BP Location: Left Arm)   Pulse 98   Temp 98.3 F (36.8 C)   Resp 16   SpO2 100%   Physical Exam Vitals and nursing note reviewed.  Constitutional:      General: She is not in acute distress.    Appearance: She is well-developed.  HENT:     Head: Normocephalic.     Comments: Patient has a oval wound approximately 3 cm in diameter noted to the crown of her scalp with a patch of Xeroform sutured to the skin and cover the wound.  Area is tender to palpation and there are some purulent discharge that was expressed at the edge of the  wound.  Mild surrounding skin erythema noted. Eyes:     Conjunctiva/sclera: Conjunctivae normal.  Cardiovascular:     Rate and Rhythm: Normal rate and regular rhythm.     Pulses: Normal pulses.     Heart sounds: Normal heart sounds.  Pulmonary:     Effort: Pulmonary effort is normal.  Abdominal:     Palpations: Abdomen is soft.     Tenderness: There is no abdominal tenderness.  Musculoskeletal:     Cervical back: Normal range of motion and neck supple.  Skin:    Findings: No rash.  Neurological:     Mental Status: She is alert.  Psychiatric:        Mood and Affect: Mood normal.     (all labs ordered are listed, but only abnormal results are displayed) Labs Reviewed  BASIC METABOLIC PANEL WITH GFR - Abnormal; Notable for the following  components:      Result Value   Chloride 94 (*)    Glucose, Bld 135 (*)    Creatinine, Ser 1.16 (*)    Calcium 10.5 (*)    GFR, Estimated 48 (*)    All other components within normal limits  CBC WITH DIFFERENTIAL/PLATELET - Abnormal; Notable for the following components:   RDW 16.0 (*)    Abs Immature Granulocytes 0.10 (*)    All other components within normal limits  I-STAT CG4 LACTIC ACID, ED  I-STAT CG4 LACTIC ACID, ED    EKG: None  Radiology: No results found.   Procedures   Medications Ordered in the ED  vancomycin (VANCOCIN) IVPB 1000 mg/200 mL premix (has no administration in time range)  acetaminophen  (TYLENOL ) tablet 1,000 mg (1,000 mg Oral Given 10/26/23 2205)                                    Medical Decision Making Amount and/or Complexity of Data Reviewed Labs: ordered. Radiology: ordered.  Risk OTC drugs. Prescription drug management.   BP (!) 152/98 (BP Location: Left Arm)   Pulse 98   Temp 98.3 F (36.8 C)   Resp 16   SpO2 100%   68:23 PM  78 year old female significant history of squamous cells carcinoma involving the scalp status post Mohs surgery on 09/12/2023 sent here by dermatologist with  concerns of poor healing wound.  History obtained through previous note from dermatology.  Patient had Mohs surgery and is repaired with secondary intention.  She recently developed a C. difficile infection and currently being treated with fidaxomicin with improvement of her symptoms.  Reports she has not had diarrhea for at least a week.  She was recently seen by dermatologist on 10/20 01/2023 with plan for closure of her wound in the operating room however it was noted that that happened very minimal granulation tissue at the site of the wound healing and dermatology was concerned of drug-resistant C. difficile and secondary to surgical wound infection encourage patient to come to the ER for further care.  Patient states for the past 3 days she noticed increasing pain at the surgical site.  She did follow-up with a dermatologist as well as Engineer, petroleum.  Presents patient states that she would likely benefit from surgical management of the wound but she would need to have insurance approval prior to the procedure.  She did follow-up with her dermatologist today who felt patient may benefit from IV antibiotic for her scalp wound and recommend patient to come to the hospital for IV antibiotic treatment.  And patient states her diarrhea has mostly improved, she did have some diarrhea today but unsure it was due to being upset or feeling nervous.  She endorsed pain at the scalp site but denies any subsequent headache no nausea vomiting.  On exam patient has a wound to the crown of her scalp with Xeroform sutured into the skin.  There was a dressing overlying the wound for which I removed and noticed some purulent discharge from the edge of the wound along with some tenderness to the palpation around the wound.  Findings concerning for underlying infection.  Vital signs notable for elevated blood pressure of 152/98 however patient is afebrile no hypoxia.  EMR reviewed patient has been seen and evaluated by her  dermatologist and plastic surgeon recently.  Dermatology notes voiced concerns of poor wound healing with possible  complication due to recent C. difficile infection.  For the most part patient states diarrhea has improved although she did have some diarrhea today.  She is otherwise well-appearing and nontoxic in appearance.  Will obtain labs and CT scan of the scalp for further assessment.  We'll start IV vancomycin antibiotic as treatment for scalp wound.    -Labs ordered, independently viewed and interpreted by me.  Labs remarkable for normal WBC, normal lactic acid.  Cr 1.16 -The patient was maintained on a cardiac monitor.  I personally viewed and interpreted the cardiac monitored which showed an underlying rhythm of: NSR -Imaging independently viewed and interpreted by me and I agree with radiologist's interpretation.  Result remarkable for head CT currently pending -This patient presents to the ED for concern of wound infection, this involves an extensive number of treatment options, and is a complaint that carries with it a high risk of complications and morbidity.  The differential diagnosis includes wound infection, abscess, cellulitis -Co morbidities that complicate the patient evaluation includes SCC,  -Treatment includes tylenol , vancomycin -Reevaluation of the patient after these medicines showed that the patient stayed the same -PCP office notes or outside notes reviewed -Discussion with triad hospitalist Dr. Lee who agrees to admit pt for IV abx -Escalation to admission/observation considered: patient is agreeable with IVF abd and admission.       Final diagnoses:  Infection of scalp    ED Discharge Orders     None          Nivia Colon, PA-C 10/26/23 2335    Bernard Drivers, MD 10/27/23 [phone]

## 2023-10-26 NOTE — H&P (Incomplete)
 History and Physical    Sheryl Kennedy FMW:[id number] DOB: Sep 21, 1945 DOA: 10/26/2023  PCP: Auston Reyes [name], MD   Patient coming from: Home   Chief Complaint:  Chief Complaint  Patient presents with   Wound Infection   ED TRIAGE note:  Pt sent by dermatologist for possible infection to surgical site- pt had skin cancer removed from the top of her head. Pt is on abx for C-diff currently.      HPI:  Sheryl Kennedy is a 78 y.o. 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 difficile colitis currently on fidaxomicin with improvement of her symptoms.  Patient has been referred from dermatology clinic as concern for slow healing of the scalp wound post Mohs procedure given patient is on chronic oral antibiotics for C. difficile infection.  Patient denies any fever, chill, nausea, abdominal pain, vomiting, and constipation.  Reported 1 episode of loose stool today.  No other complaint at this time.   Patient states for the past 3 days she noticed increasing pain at the surgical site.  She did follow-up with a dermatologist as well as Engineer, petroleum.  Presents patient states that she would likely benefit from surgical management of the wound but she would need to have insurance approval prior to the procedure.  She did follow-up with her dermatologist today who felt patient may benefit from IV antibiotic for her scalp wound and recommend patient to come to the hospital for IV antibiotic treatment.  And patient states her diarrhea has mostly improved, she did have some diarrhea today but unsure it was due to being upset or feeling nervous.  She endorsed pain at the scalp site but denies any subsequent headache no nausea vomiting.    Per chart review patient follows plastic surgeon last clinic visit on 10/21.  Chronic  wound of the vertex of the scalp status post Moh surgery for squamous cell carcinoma.  Per chart review a plastic surgeon recommendation patient has been offered skin graft placement and alternative include observation given delayed progression of secondary intention healing and multiple debridement.   ED Course:  At presentation to ED patient is hemodynamically stable.  Afebrile. Lab work, BMP unremarkable renal function at baseline. CBC unremarkable.  Normal lactic acid level.  In the ED patient received IV vancomycin.  Hospitalist consulted for further evaluation management of scalp cellulitis.   Significant labs in the ED: Lab Orders         Culture, blood (Routine X 2) w Reflex to ID Panel         Basic metabolic panel         CBC with Differential         Comprehensive metabolic panel         CBC       Review of Systems:  Review of Systems  Constitutional:  Negative for chills and fever.  HENT:         Scalp pain and very sensitive to touch  Cardiovascular:  Negative for chest pain.  Gastrointestinal:  Negative for abdominal pain, blood in stool, constipation, diarrhea, nausea and vomiting.  Musculoskeletal:  Negative for myalgias.  Neurological:  Negative for dizziness.  Psychiatric/Behavioral:  The patient is not nervous/anxious.     Past Medical History:  Diagnosis Date   Arthritis    Cancer (HCC) 08/2012   skin right anterior  lower leg, squamous cell   DSAP (disseminated superficial actinic porokeratosis)    Gout    Hx of seasonal allergies    Hyperlipidemia    Hypertension    SCC (squamous cell carcinoma) 07/31/2023   superfical infiltration, R crown scalp, referral to Dr. Corey for [facility]   SCC (squamous cell carcinoma) 07/31/2023   R lateral lower leg, EDC 09/11/23   Squamous cell carcinoma of skin    R anterior lower leg   Squamous cell carcinoma of skin 12/12/2022   SCC/KA type. Left forearm. Excision 02/01/2023   Varicose veins of lower extremities with  other complications     Past Surgical History:  Procedure Laterality Date   BACK SURGERY  2008   no metal in back   BREAST BIOPSY Right 09/13/2010   core/neg   BREAST BIOPSY Right 12/09/2015   us  biopsy/neg   BREAST SURGERY Right 2012   CATARACT EXTRACTION W/PHACO Left 04/25/2022   Procedure: CATARACT EXTRACTION PHACO AND INTRAOCULAR LENS PLACEMENT (IOC) LEFT 3.42 00:25.2;  Surgeon: Myrna Adine Anes, MD;  Location: [facility];  Service: Ophthalmology;  Laterality: Left;   CATARACT EXTRACTION W/PHACO Right 05/23/2022   Procedure: CATARACT EXTRACTION PHACO AND INTRAOCULAR LENS PLACEMENT (IOC) RIGHT 3.84 00:28.1;  Surgeon: Myrna Adine Anes, MD;  Location: [facility];  Service: Ophthalmology;  Laterality: Right;   CHONDROPLASTY Left 01/22/2016   Procedure: arthroscopic medial AND LATERAL CHONDROPLASTY;  Surgeon: Lynwood SHAUNNA Hue, MD;  Location: [facility] ORS;  Service: Orthopedics;  Laterality: Left;   COLONOSCOPY  2011   COLONOSCOPY WITH PROPOFOL  N/A 04/14/2015   Procedure: COLONOSCOPY WITH PROPOFOL ;  Surgeon: Gladis RAYMOND Mariner, MD;  Location: [facility] END[facility]COPY;  Service: Endoscopy;  Laterality: N/A;   CYST REMOVAL HAND Left 2012   JOINT REPLACEMENT Right 09/30/2013   TOTAL KNEE REPLACEMENT, DR. HUE, [facility]   KNEE ARTHROSCOPY WITH MEDIAL MENISECTOMY Left 01/22/2016   Procedure: KNEE ARTHROSCOPY WITH MEDIAL AND LATERAL MENISECTOMY;  Surgeon: Lynwood SHAUNNA Hue, MD;  Location: [facility] ORS;  Service: Orthopedics;  Laterality: Left;   KNEE SURGERY  1997   LEFT HEART CATH AND CORONARY ANGIOGRAPHY N/A 10/25/2021   Procedure: LEFT HEART CATH AND CORONARY ANGIOGRAPHY;  Surgeon: Mady Bruckner, MD;  Location: [facility] INVASIVE CV LAB;  Service: Cardiovascular;  Laterality: N/A;   Stab pheblectomy  Left 2013   TONSILLECTOMY     VEIN CLOSURE Bilateral 2012     reports that she has never smoked. She has never used smokeless tobacco. She reports current alcohol use. She reports that she does not use  drugs.  Allergies  Allergen Reactions   Erythromycin Other (See Comments)   Levofloxacin     Other Reaction(s): Dizziness   Naproxen Swelling    In hands and joints   Pseudoephedrine Rash and Other (See Comments)    Rhinitis and sneezing Rhinitis and sneezing Rhinitis and sneezing   Shellfish Allergy Other (See Comments) and Dermatitis    Joint swelling, and gout   Sulfa Antibiotics Rash    Family History  Problem Relation Age of Onset   Heart failure Mother    Heart disease Mother    Arthritis Mother    Colon cancer Paternal Grandmother    Breast cancer Neg Hx     Prior to Admission medications   Medication Sig Start Date End Date Taking? Authorizing Provider  allopurinol  (ZYLOPRIM ) 300 MG tablet Take 300 mg by mouth daily.    [provider]  aspirin  EC 81 MG tablet Take 81 mg  by mouth daily.    [provider]  Cholecalciferol (VITAMIN D3) 25 MCG (1000 UT) CAPS Take by mouth.    [provider]  clobetasol  (TEMOVATE ) 0.05 % external solution APPLY TOPICALLY TO SCALP IN THE MORNING AFTER SHAMPOO FOR ITCHY SCALP Patient not taking: Reported on 10/24/2023 01/05/22   Jackquline Sawyer, MD  colchicine  0.6 MG tablet Take 0.5 tablets (0.3 mg total) by mouth daily for 7 days. Patient not taking: Reported on 10/24/2023 10/27/21 11/03/21  Laurita Pillion, MD  Cyanocobalamin  1000 MCG TBCR Take 1 tablet by mouth daily.     [provider]  fexofenadine (ALLEGRA) 180 MG tablet Take 180 mg by mouth daily as needed for allergies.    [provider]  fidaxomicin (DIFICID) 200 MG TABS tablet Take 1 tablet (200 mg total) by mouth 2 (two) times daily for 3 days, THEN 1 tablet (200 mg total) every other day for 15 days. 10/08/23 10/26/23  Amin, Sumayya, MD  Fluocinolone  Acetonide Body 0.01 % OIL Apply topically to itchy scaly spot at scalp cover with shower cap qhs Patient not taking: Reported on 10/24/2023 03/21/23   Stewart, Tara, MD  fluticasone (FLONASE)  50 MCG/ACT nasal spray Place 1 spray into both nostrils daily as needed for allergies or rhinitis.    [provider]  ketoconazole  (NIZORAL ) 2 % shampoo WASH SCALP 2-3 TIMES WEEKLY, LET SIT 5 MINUTES AND RINSE 08/01/22   Jackquline Sawyer, MD  levothyroxine  (SYNTHROID ) 50 MCG tablet Take 50 mcg by mouth daily before breakfast.    [provider]  metoprolol succinate (TOPROL-XL) 25 MG 24 hr tablet Take 25 mg by mouth daily.  04/26/13   [provider]  mupirocin  ointment (BACTROBAN ) 2 % APPLY 1 APPLICATION TOPICALLY DAILY Patient not taking: Reported on 10/24/2023 09/14/23   Claudene Lehmann, MD  Nutritional Supplements (ESTROVEN PM PO) Take 0.5 tablets by mouth at bedtime. For hotflashes    [provider]  potassium chloride  SA (K-DUR,KLOR-CON ) 20 MEQ tablet Take 20 mEq by mouth 3 (three) times daily.  03/28/13   [provider]  simvastatin  (ZOCOR ) 20 MG tablet Take 20 mg by mouth at bedtime.    [provider]  spironolactone-hydrochlorothiazide (ALDACTAZIDE) 25-25 MG tablet Take 1 tablet by mouth daily.    [provider]  tiZANidine (ZANAFLEX) 2 MG tablet Take 2 mg by mouth 3 (three) times daily as needed. 06/30/23   [provider]  torsemide (DEMADEX) 20 MG tablet Take 20 mg by mouth daily.  04/26/13   [provider]  traMADol  (ULTRAM ) 50 MG tablet Take 50 mg by mouth every 6 (six) hours as needed for moderate pain (pain score 4-6).    [provider]  traZODone (DESYREL) 50 MG tablet Take 25 mg by mouth at bedtime.    [provider]  triamcinolone  cream (KENALOG ) 0.1 % Apply to affected areas rash on body once to twice daily until improved. Avoid face, groin, axilla. Patient not taking: Reported on 10/24/2023 04/13/22   Jackquline Sawyer, MD     Physical Exam: Vitals:   10/26/23 1614 10/26/23 2021 10/26/23 2215 10/27/23 0031  BP: (!) 141/92 (!) 152/98 112/60 116/76  Pulse: 79 98 78 76  Resp: 18  16 15 18   Temp: 98.7 F (37.1 C) 98.3 F (36.8 C)  98.6 F (37 C)  TempSrc: Oral   Oral  SpO2: 100% 100% 99% 96%    Physical Exam Vitals and nursing note reviewed.  Constitutional:  General: She is not in acute distress.    Appearance: She is not ill-appearing.  HENT:     Mouth/Throat:     Mouth: Mucous membranes are moist.  Eyes:     Pupils: Pupils are equal, round, and reactive to light.  Cardiovascular:     Rate and Rhythm: Normal rate and regular rhythm.     Pulses: Normal pulses.     Heart sounds: Normal heart sounds.  Pulmonary:     Effort: Pulmonary effort is normal.     Breath sounds: Normal breath sounds.  Abdominal:     Palpations: Abdomen is soft.  Musculoskeletal:     Cervical back: Neck supple.  Skin:    General: Skin is dry.     Capillary Refill: Capillary refill takes less than 2 seconds.     Findings: Lesion present.  Neurological:     Mental Status: She is alert and oriented to person, place, and time.  Psychiatric:        Mood and Affect: Mood normal.      Media Information   Document Information  Photos    10/27/2023 00:07  Attached To:  Hospital Encounter on 10/26/23  Source Information  Lee Kingfisher, MD  [facility]    Labs on Admission: I have personally reviewed following labs and imaging studies  CBC: Recent Labs  Lab 10/26/23 2214  WBC 7.8  NEUTROABS 4.8  HGB 12.6  HCT 39.5  MCV 95.2  PLT 360   Basic Metabolic Panel: Recent Labs  Lab 10/26/23 2214  NA 135  K 3.9  CL 94*  CO2 29  GLUCOSE 135*  BUN 16  CREATININE 1.16*  CALCIUM 10.5*   GFR: Estimated Creatinine Clearance: 32.7 mL/min (A) (by C-G formula based on SCr of 1.16 mg/dL (H)). Liver Function Tests: No results for input(s): AST, ALT, ALKPHOS, BILITOT, PROT, ALBUMIN in the last 168 hours. No results for input(s): LIPASE, AMYLASE in the last 168 hours. No results for input(s): AMMONIA in the last 168  hours. Coagulation Profile: No results for input(s): INR, PROTIME in the last 168 hours. Cardiac Enzymes: No results for input(s): CKTOTAL, CKMB, CKMBINDEX, TROPONINI, TROPONINIHS in the last 168 hours. BNP (last 3 results) No results for input(s): BNP in the last 8760 hours. HbA1C: No results for input(s): HGBA1C in the last 72 hours. CBG: No results for input(s): GLUCAP in the last 168 hours. Lipid Profile: No results for input(s): CHOL, HDL, LDLCALC, TRIG, CHOLHDL, LDLDIRECT in the last 72 hours. Thyroid Function Tests: No results for input(s): TSH, T4TOTAL, FREET4, T3FREE, THYROIDAB in the last 72 hours. Anemia Panel: No results for input(s): VITAMINB12, FOLATE, FERRITIN, TIBC, IRON, RETICCTPCT in the last 72 hours. Urine analysis:    Component Value Date/Time   COLORURINE STRAW (A) 10/06/2023 1429   APPEARANCEUR CLEAR (A) 10/06/2023 1429   APPEARANCEUR Clear 09/18/2013 0919   LABSPEC 1.006 10/06/2023 1429   LABSPEC 1.006 09/18/2013 0919   PHURINE 5.0 10/06/2023 1429   GLUCOSEU NEGATIVE 10/06/2023 1429   GLUCOSEU Negative 09/18/2013 0919   HGBUR NEGATIVE 10/06/2023 1429   BILIRUBINUR NEGATIVE 10/06/2023 1429   BILIRUBINUR Negative 09/18/2013 0919   KETONESUR NEGATIVE 10/06/2023 1429   PROTEINUR NEGATIVE 10/06/2023 1429   NITRITE NEGATIVE 10/06/2023 1429   LEUKOCYTESUR NEGATIVE 10/06/2023 1429   LEUKOCYTESUR Trace 09/18/2013 0919    Radiological Exams on Admission: I have personally reviewed images CT Head W or Wo Contrast Result Date: 10/27/2023 EXAM: CT HEAD WITHOUT AND WITH CONTRAST 10/27/2023 12:02:58  AM TECHNIQUE: CT of the head was performed without and with the administration of intravenous contrast (60mL iohexol  (OMNIPAQUE ) 300 MG/ML solution). Automated exposure control, iterative reconstruction, and/or weight based adjustment of the mA/kV was utilized to reduce the radiation dose to as low as reasonably  achievable. COMPARISON: CT head 10/06/2023 CLINICAL HISTORY: Skin cancer, assess treatment response; moh's procedure on scalp, concern of infeciton. FINDINGS: BRAIN AND VENTRICLES: No acute intracranial hemorrhage. No mass effect or midline shift. No extra-axial fluid collection. No evidence of acute infarct. No hydrocephalus. No abnormal enhancement. ORBITS: No acute abnormality. SINUSES AND MASTOIDS: No acute abnormality. SOFT TISSUES AND SKULL: Mildly increased soft tissue thickening and ulceration at the vertex. No discrete/drainable fluid collection or significant skull erosion.c IMPRESSION: 1. Mildly increased soft tissue thickening and ulceration at the vertex, which is nonspecific but could represent progressive malignancy, infection, and/or scar tissue. No discrete/drainable fluid collection or significant skull erosion. 2. No evidence of acute intracranial abnormality. Electronically signed by: Gilmore Molt MD 10/27/2023 12:24 AM EDT RP Workstation: [id number]     Assessment/Plan: Principal Problem:   Cellulitis of head or scalp Active Problems:   CKD stage 3b, GFR 30-44 ml/min (HCC)   C. 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  Last clinic visit with plastic surgery on 10/21 waiting for graft placement upon insurance approval.  Being seen by dermatology today given patient has developing infection and slow healing.  Being evaluated by dermatology clinic today recommended hospital admission for IV antibiotics. - Hemodynamically stable.  Afebrile. - CBC no evidence of leukocytosis.  Normal lactic acid level.  Blood cultures are in process. -CT head showed   Mildly increased soft tissue thickening and ulceration at the vertex, which is nonspecific but could represent progressive malignancy, infection, and/or scar tissue. No discrete/drainable fluid collection or significant skull erosion. - Starting IV vancomycin for treatment of scalp cellulitis.  Will avoid cephalosporin given patient has recent C. difficile colitis. -Need to follow-up with blood culture result.  Monitor for improvement of wound healing   History of recurrent C. difficile colitis - Diarrhea has been improving.  Patient reported 1 episode of loose stool today.  Patient completed 3 weeks course of fidaxomicin on 10/20.   CKD stage IIIb -Creatinine 1.16 and GFR 48 prerenal function at baseline.  Continue to monitor  Essential hypertension -Given patient has recent hospital admission and during that time patient able of AKI spironolactone and hydrochlorothiazide on hold.  Blood pressure within good range.   -Continue Toprol-XL. -If renal function remains stable and based on blood pressure number can resume other home blood pressure regimens.  Hyperlipidemia -Continue Zocor   Hypothyroidism -Continue thyroxine  Insomnia -Continue trazodone  DVT prophylaxis:  SQ Heparin  Code Status:  Full Code Diet: Heart healthy diet Family Communication:   Patient's daughter, son and husband at the bedside at the time of interview. Opportunity was given to ask question and all questions were answered satisfactorily.  Disposition Plan: Need to follow-up with blood cultures result and need for operative scalp inflammation Consults: None indicated at this time Admission status:   Inpatient, Med-Surg  Severity of Illness: The appropriate patient status for this patient is  INPATIENT. Inpatient status is judged to be reasonable and necessary in order to provide the required intensity of service to ensure the patient's safety. The patient's presenting symptoms, physical exam findings, and  initial radiographic and laboratory data in the context of their chronic comorbidities is felt to place them at high risk for further clinical deterioration. Furthermore, it is not anticipated that the patient will be medically stable for discharge from the hospital within 2 midnights of admission.   * I certify that at the point of admission it is my clinical judgment that the patient will require inpatient hospital care spanning beyond 2 midnights from the point of admission due to high intensity of service, high risk for further deterioration and high frequency of surveillance required.DEWAINE    Michael Ventresca, MD [facility]  How to contact the [facility] Attending or Consulting provider 7A - 7P or covering provider during after hours 7P -7A, for this patient.  Check the care team in [facility] and look for a) attending/consulting [facility] provider listed and b) the [facility] team listed Log into [email or web address] and use [place]'s universal password to access. If you do not have the password, please contact the hospital operator. Locate the [facility] provider you are looking for under [facility] and page to a number that you can be directly reached. If you still have difficulty reaching the provider, please page the [facility] (Director on Call) for the Hospitalists listed on amion for assistance.  10/27/2023, 12:49 AM

## 2023-10-26 NOTE — Progress Notes (Unsigned)
 Follow Up Visit   Subjective  Sheryl Kennedy is a 78 y.o. female who presents for the following: follow up from Mohs surgery   The patient presents for follow up from Mohs surgery for a SCC on the right crown scalp, treated on 09/12/23, repaired with 2nd intention. Wound healing has been complicated by multidrug resistant C. Diff, which the patient the patient has been dealing with prior to her Mohs surgery with us , unknown to our surgical team initially. She endorses pain and the surgical site. Bolster is in place. She saw Dr. Montorfano for consult for potential graft or dermal substitute earlier this week, and the plan will be for scheduling the surgery with him.   She states that she feels better from her C Diff infection is still on fidaxomicin. She states that she has not had diarrhea in at least a week.   Patient was seen by Dr. Montorfano on 10/24/2023 and plans to proceed with closure in the operating room.    The following portions of the chart were reviewed this encounter and updated as appropriate: medications, allergies, medical history  Review of Systems:  No other skin or systemic complaints except as noted in HPI or Assessment and Plan.  Objective  Well appearing patient in no apparent distress; mood and affect are within normal limits.  A focal examination was performed including scalp, head, face and scalp All findings within normal limits unless otherwise noted below.  Healing wound with mild erythema  Relevant physical exam findings are noted in the Assessment and Plan.  Assessment & Plan   Slow Healing Wound s/p Mohs for SCC on the scalp, treated on 09/12/23,  healing by second intention s/p purse string suture - Very minimal granulation tissue noted despite multiple attempts at inducing wound healing, including amnion allograft placement at last visit - Likely concurrent drug resistant C Diff Infection contributing to delayed wound healing - Patient presented today  with tenderness on scalp surrounding bolster dressing. Patient was seen by Dr. Montorfano earlier this week to discuss closure of the delayed healing wound, s/p  Mohs for [facility] on the scalp. Patient has undergone complicated multi drug resistant clostridium Difficile infection, initially unknown to our surgical team, prior to her surgery, for which she has undergone several treatments, and recently finished a course of  fidaxomicin on Monday, October 20. The patient presented today with noticeable odorous changes, consistent with recurrent C Diff infection. Enteric precautions were initiated. She proceeded to have a bout of diarrhea in our office bathroom. The patient has been undergoing work up for diarrhea since July 2025. We were unaware of her history of C Diff prior to proceeding with Mohs surgery on 09/11/2023. The patient proceeded to have progressive delay in wound healing, despite multiple attempts to induce a favorable environment for granulation. Several attempts were made, including purse string closure, bolster dressing with Xeroform gauze stitched in, silicone gel as well as donated sterile Amnion allograft from [employer]. No interventions have proved beneficial for her wound healing, and the multi drug resistant C Diff has worsened. Thus making us  suspicious for a vicious cycle between her poorly treated C Diff and her delayed and worsening wound healing for the post Mohs surgical defect on her scalp. When I saw the patient yesterday, I spoke with her and her daughter-in-law about the difficulty in us  managing a skin infection with oral antibiotics when she has concurrent multi drug resistant C Diff that seems to not have been fully treated.  It seems she may be re-inoculating herself, and worsening her ability to heal her wound on her scalp. I recommended that she proceed to the hospital for inpatient work up of her multi drug resistance C Diff, as well as her poor wound healing, and be considered for  therapies that would cover for skin infection, as well as cover her for her C Diff without making her gut flora worse. The patient agreed, and proceeded to [facility].   Active C- Diff Infection  Patient advised to go to Emergency Department at [facility] for treatment of drug resistant C. Diff and secondary surgical wound infection. Emergency Room contacted and are aware the patient is on the way.   Return for based on inpatient workup and Plastics recomendation.   LILLETTE Rollene Gobble, RN, am acting as scribe for RUFUS CHRISTELLA HOLY, MD .   Documentation: I have reviewed the above documentation for accuracy and completeness, and I agree with the above.  RUFUS CHRISTELLA HOLY, MD

## 2023-10-27 ENCOUNTER — Encounter: Payer: Self-pay | Admitting: Dermatology

## 2023-10-27 DIAGNOSIS — L03811 Cellulitis of head [any part, except face]: Secondary | ICD-10-CM

## 2023-10-27 DIAGNOSIS — I129 Hypertensive chronic kidney disease with stage 1 through stage 4 chronic kidney disease, or unspecified chronic kidney disease: Secondary | ICD-10-CM

## 2023-10-27 DIAGNOSIS — S0100XA Unspecified open wound of scalp, initial encounter: Secondary | ICD-10-CM | POA: Diagnosis not present

## 2023-10-27 DIAGNOSIS — Z85828 Personal history of other malignant neoplasm of skin: Secondary | ICD-10-CM | POA: Diagnosis not present

## 2023-10-27 DIAGNOSIS — L089 Local infection of the skin and subcutaneous tissue, unspecified: Secondary | ICD-10-CM | POA: Diagnosis not present

## 2023-10-27 DIAGNOSIS — C4442 Squamous cell carcinoma of skin of scalp and neck: Secondary | ICD-10-CM | POA: Diagnosis not present

## 2023-10-27 DIAGNOSIS — A0471 Enterocolitis due to Clostridium difficile, recurrent: Secondary | ICD-10-CM

## 2023-10-27 LAB — CBC
HCT: 37.6 % (ref 36.0–46.0)
Hemoglobin: 12.6 g/dL (ref 12.0–15.0)
MCH: 31.7 pg (ref 26.0–34.0)
MCHC: 33.5 g/dL (ref 30.0–36.0)
MCV: 94.7 fL (ref 80.0–100.0)
Platelets: 326 K/uL (ref 150–400)
RBC: 3.97 MIL/uL (ref 3.87–5.11)
RDW: 16 % — ABNORMAL HIGH (ref 11.5–15.5)
WBC: 8.1 K/uL (ref 4.0–10.5)
nRBC: 0 % (ref 0.0–0.2)

## 2023-10-27 LAB — COMPREHENSIVE METABOLIC PANEL WITH GFR
ALT: 15 U/L (ref 0–44)
AST: 25 U/L (ref 15–41)
Albumin: 3.4 g/dL — ABNORMAL LOW (ref 3.5–5.0)
Alkaline Phosphatase: 69 U/L (ref 38–126)
Anion gap: 12 (ref 5–15)
BUN: 19 mg/dL (ref 8–23)
CO2: 27 mmol/L (ref 22–32)
Calcium: 9.9 mg/dL (ref 8.9–10.3)
Chloride: 95 mmol/L — ABNORMAL LOW (ref 98–111)
Creatinine, Ser: 1.12 mg/dL — ABNORMAL HIGH (ref 0.44–1.00)
GFR, Estimated: 50 mL/min — ABNORMAL LOW (ref >60)
Glucose, Bld: 92 mg/dL (ref 70–99)
Potassium: 3.6 mmol/L (ref 3.5–5.1)
Sodium: 134 mmol/L — ABNORMAL LOW (ref 135–145)
Total Bilirubin: 0.4 mg/dL (ref 0.0–1.2)
Total Protein: 5.4 g/dL — ABNORMAL LOW (ref 6.5–8.1)

## 2023-10-27 MED ORDER — VANCOMYCIN HCL IN DEXTROSE 1-5 GM/200ML-% IV SOLN: 1000.0000 mg | INTRAVENOUS | Status: DC

## 2023-10-27 MED ORDER — OXYCODONE HCL 5 MG PO TABS: 5.0000 mg | ORAL_TABLET | Freq: Four times a day (QID) | ORAL | Status: DC | PRN

## 2023-10-27 MED ORDER — TRAMADOL HCL 50 MG PO TABS
50.0000 mg | ORAL_TABLET | Freq: Two times a day (BID) | ORAL | Status: DC
  Administered 2023-10-27 – 2023-10-29 (×4): 50 mg via ORAL
  Filled 2023-10-27 (×4): qty 1

## 2023-10-27 MED ORDER — VANCOMYCIN HCL 125 MG PO CAPS
125.0000 mg | ORAL_CAPSULE | Freq: Two times a day (BID) | ORAL | Status: DC
  Administered 2023-10-27 – 2023-10-29 (×4): 125 mg via ORAL
  Filled 2023-10-27 (×4): qty 1

## 2023-10-27 MED ORDER — DAPTOMYCIN-SODIUM CHLORIDE 500-0.9 MG/50ML-% IV SOLN
8.0000 mg/kg | Freq: Every day | INTRAVENOUS | Status: DC
  Administered 2023-10-28: 500 mg via INTRAVENOUS
  Filled 2023-10-27 (×2): qty 50

## 2023-10-27 MED ORDER — SODIUM CHLORIDE 0.9 % IV SOLN
1.0000 g | Freq: Two times a day (BID) | INTRAVENOUS | Status: DC
  Administered 2023-10-27 – 2023-10-29 (×4): 1 g via INTRAVENOUS
  Filled 2023-10-27 (×5): qty 10

## 2023-10-27 NOTE — Hospital Course (Addendum)
 78yo with h/o HTN, HLD, hypothyroidism, chronic diarrhea with recurrent C difficile colitis on fidaxomicin, stage 3b CKD, and SCC of the scalp s/p Mohs surgery (09/12/23) who was sent from dermatology clinic on 10/23 for concern of surgical site infection/scalp cellulitis.  She was started on IV Daptomycin and Cefepime per ID with PO Vancomycin for empiric prevention of recurrent C diff.    Plastic surgery did bedside I&D and she is improving.

## 2023-10-27 NOTE — Progress Notes (Signed)
   10/27/23 1536  TOC Brief Assessment  Insurance and Status Reviewed  Patient has primary care physician Yes  Home environment has been reviewed home with family  Prior level of function: independent  Prior/Current Home Services No current home services  Social Drivers of Health Review SDOH reviewed no interventions necessary  Readmission risk has been reviewed Yes  Transition of care needs no transition of care needs at this time

## 2023-10-27 NOTE — Progress Notes (Signed)
 ID Pharmacist Brief Note    Spoke with Dr. 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 Sheryl Kennedy received a dose of vancomycin today at 0254 and will not be due for another dose until tomorrow afternoon. Will scheduled Daptomycin 500 mg IV Q 24 (~ 8 mg/kg) to start 10/25 at 1400.   CK level to be checked 10/25 AM.    Damien Quiet, PharmD, BCPS, BCIDP Infectious Diseases Clinical Pharmacist Phone: [phone] 10/27/2023 11:31 AM

## 2023-10-27 NOTE — Progress Notes (Signed)
 Pt is up to unit from ED via wheelchair with ED staff at side, along with family.

## 2023-10-27 NOTE — Consult Note (Signed)
 Sheryl Kennedy is a 78 y.o. 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 She has been bandaging the wound as directed and denies any concerns related to the surgical site, including pain, drainage, or signs of infection. She reports improvement in her recent C. difficile infection and remains on fidaxomicin. She notes no episodes of diarrhea for at least one week. Very minimal granulation tissue noted despite multiple attempts to promote healing, including amnion allograft placement at one of the visits with Dr. Corey. Likely delayed wound healing multifactorial.  Patient is a non-smoker.  Patient was referred to me by Dr. Paci for management of chronic scalp wound.  Patient was admitted to the hospital due to scalp infection s/p Mohs. Bolster in place. As Dr. Corey does not have privileges in the hospital I was consulted for bolster removal and scalp wound assessment and management.   Medications:  Current Facility-Administered Medications:    acetaminophen  (TYLENOL ) tablet 650 mg, 650 mg, Oral, Q6H PRN, 650 mg at 10/27/23 1625 **OR** acetaminophen  (TYLENOL ) suppository 650 mg, 650 mg, Rectal, Q6H PRN, Sundil, Subrina, MD   allopurinol  (ZYLOPRIM ) tablet 300 mg, 300 mg, Oral, Daily, Sundil, Subrina, MD, 300 mg at 10/27/23 0907   ceFEPIme (MAXIPIME) 1 g in sodium chloride  0.9 % 100 mL IVPB, 1 g, Intravenous, Q12H, Ravishankar, Donald, MD, Last Rate: 200 mL/hr at 10/27/23 2025, 1 g at 10/27/23 2025   cyanocobalamin  (VITAMIN B12) tablet 1,000 mcg, 1,000 mcg, Oral, Daily, Sundil, Subrina, MD, 1,000 mcg at 10/27/23 0907   [START ON 10/28/2023] DAPTOmycin (CUBICIN) IVPB 500 mg/53mL premix, 8 mg/kg, Intravenous, Q1400, Fayette Donald, MD   heparin  injection 5,000 Units, 5,000 Units, Subcutaneous, Q8H,  Sundil, Subrina, MD, 5,000 Units at 10/27/23 2021   levothyroxine  (SYNTHROID ) tablet 50 mcg, 50 mcg, Oral, QAC breakfast, Sundil, Subrina, MD, 50 mcg at 10/27/23 0518   metoprolol succinate (TOPROL-XL) 24 hr tablet 25 mg, 25 mg, Oral, Daily, Sundil, Subrina, MD   ondansetron  (ZOFRAN ) tablet 4 mg, 4 mg, Oral, Q6H PRN **OR** ondansetron  (ZOFRAN ) injection 4 mg, 4 mg, Intravenous, Q6H PRN, Sundil, Subrina, MD   oxyCODONE  (Oxy IR/ROXICODONE ) immediate release tablet 5 mg, 5 mg, Oral, Q6H PRN, Sundil, Subrina, MD   simvastatin  (ZOCOR ) tablet 20 mg, 20 mg, Oral, QHS, Sundil, Subrina, MD, 20 mg at 10/27/23 2019   sodium chloride  flush (NS) 0.9 % injection 3 mL, 3 mL, Intravenous, Q12H, Sundil, Subrina, MD, 3 mL at 10/27/23 0912   sodium chloride  flush (NS) 0.9 % injection 3 mL, 3 mL, Intravenous, Q12H, Sundil, Subrina, MD, 3 mL at 10/26/23 2346   sodium chloride  flush (NS) 0.9 % injection 3 mL, 3 mL, Intravenous, PRN, Sundil, Subrina, MD   traMADol  (ULTRAM ) tablet 50 mg, 50 mg, Oral, BID, Barbarann Nest, MD, 50 mg at 10/27/23 2018   traZODone (DESYREL) tablet 25 mg, 25 mg, Oral, QHS, Sundil, Subrina, MD, 25 mg at 10/27/23 2020   vancomycin (VANCOCIN) capsule 125 mg, 125 mg, Oral, BID, Ravishankar, Jayashree, MD, 125 mg at 10/27/23 2020  Allergies:  Allergies  Allergen Reactions   Ery-Tab [Erythromycin] Other (See Comments)    Unknown reaction   Fosamax [Alendronate] Other (See Comments)    Possible cause of hypercalcemia   Levaquin [Levofloxacin] Other (See Comments)     Dizziness    Aleve [Naproxen] Swelling  and Other (See Comments)    Arthralgias  Swelling in hands/joints, also   Shellfish Allergy Swelling, Dermatitis and Other (See Comments)    Arthralgias Joint swelling, also Gout flares   Sudafed [Pseudoephedrine] Rash and Other (See Comments)    Rhinitis Sneezing   Sulfa Antibiotics Rash    Past Medical History:  Past Medical History:  Diagnosis Date   Arthritis    Cancer (HCC)  08/2012   skin right anterior lower leg, squamous cell   DSAP (disseminated superficial actinic porokeratosis)    Gout    Hx of seasonal allergies    Hyperlipidemia    Hypertension    SCC (squamous cell carcinoma) 07/31/2023   superfical infiltration, R crown scalp, referral to Dr. [facility]   SCC (squamous cell carcinoma) 07/31/2023   R lateral lower leg, EDC 09/11/23   Squamous cell carcinoma of skin    R anterior lower leg   Squamous cell carcinoma of skin 12/12/2022   SCC/KA type. Left forearm. Excision 02/01/2023   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 complications     Past Surgical History:  Past Surgical History:  Procedure Laterality Date   BACK SURGERY  2008   no metal in back   BREAST BIOPSY Right 09/13/2010   core/neg   BREAST BIOPSY Right 12/09/2015   us  biopsy/neg   BREAST SURGERY Right 2012   CATARACT EXTRACTION W/PHACO Left 04/25/2022   Procedure: CATARACT EXTRACTION PHACO AND INTRAOCULAR LENS PLACEMENT (IOC) LEFT 3.42 00:25.2;  Surgeon: Myrna Adine Anes, MD;  Location: [facility];  Service: Ophthalmology;  Laterality: Left;   CATARACT EXTRACTION W/PHACO Right 05/23/2022   Procedure: CATARACT EXTRACTION PHACO AND INTRAOCULAR LENS PLACEMENT (IOC) RIGHT 3.84 00:28.1;  Surgeon: Myrna Adine Anes, MD;  Location: [facility];  Service: Ophthalmology;  Laterality: Right;   CHONDROPLASTY Left 01/22/2016   Procedure: arthroscopic medial AND LATERAL CHONDROPLASTY;  Surgeon: Lynwood SHAUNNA Hue, MD;  Location: [facility] OR[facility];  Service: Orthopedics;  Laterality: Left;   COLONOSCOPY  2011   COLONOSCOPY WITH PROPOFOL  N/A 04/14/2015   Procedure: COLONOSCOPY WITH PROPOFOL ;  Surgeon: Gladis RAYMOND Mariner, MD;  Location: [facility];  Service: Endoscopy;  Laterality: N/A;   CYST REMOVAL HAND Left 2012   JOINT REPLACEMENT Right 09/30/2013   TOTAL KNEE REPLACEMENT, DR. HUE, [facility]   KNEE ARTHROSCOPY WITH MEDIAL MENISECTOMY Left 01/22/2016   Procedure: KNEE  ARTHROSCOPY WITH MEDIAL AND LATERAL MENISECTOMY;  Surgeon: Lynwood SHAUNNA Hue, MD;  Location: [facility] OR[facility];  Service: Orthopedics;  Laterality: Left;   KNEE SURGERY  1997   LEFT HEART CATH AND CORONARY ANGIOGRAPHY N/A 10/25/2021   Procedure: LEFT HEART CATH AND CORONARY ANGIOGRAPHY;  Surgeon: Mady Bruckner, MD;  Location: [facility] INVASIVE CV LAB;  Service: Cardiovascular;  Laterality: N/A;   Stab pheblectomy  Left 2013   TONSILLECTOMY     VEIN CLOSURE Bilateral 2012    Social History:  Social History   Socioeconomic History   Marital status: Married    Spouse name: Not on file   Number of children: Not on file   Years of education: Not on file   Highest education level: Not on file  Occupational History   Not on file  Tobacco Use   Smoking status: Never   Smokeless tobacco: Never  Substance and Sexual Activity   Alcohol use: Yes    Comment: occassional   Drug use: No   Sexual activity: Not Currently  Other Topics Concern   Not on file  Social  History Narrative   Not on file   Social Drivers of Health   Financial Resource Strain: Medium Risk (07/25/2023)   Received from [facility]   Overall Financial Resource Strain (CARDIA)    Difficulty of Paying Living Expenses: Somewhat hard  Food Insecurity: No Food Insecurity (10/27/2023)   Hunger Vital Sign    Worried About Running Out of Food in the Last Year: Never true    Ran Out of Food in the Last Year: Never true  Transportation Needs: No Transportation Needs (10/27/2023)   PRAPARE - Administrator, Civil Service (Medical): No    Lack of Transportation (Non-Medical): No  Physical Activity: Not on file  Stress: Not on file  Social Connections: Socially Integrated (10/27/2023)   Social Connection and Isolation Panel    Frequency of Communication with Friends and Family: More than three times a week    Frequency of Social Gatherings with Friends and Family: Three times a week    Attends Religious  Services: More than 4 times per year    Active Member of Clubs or Organizations: Yes    Attends Banker Meetings: More than 4 times per year    Marital Status: Married  Catering manager Violence: Not At Risk (10/27/2023)   Humiliation, Afraid, Rape, and Kick questionnaire    Fear of Current or Ex-Partner: No    Emotionally Abused: No    Physically Abused: No    Sexually Abused: No    Family History  Problem Relation Age of Onset   Heart failure Mother    Heart disease Mother    Arthritis Mother    Colon cancer Paternal Grandmother    Breast cancer Neg Hx      PE: RN as chaperone      10/27/2023    7:25 PM 10/27/2023    1:19 PM 10/27/2023    9:41 AM  Vitals with BMI  Systolic 120 116 894  Diastolic 68 67 89  Pulse 84 85 83    Constitutional: Good color, good hydration. VSS. Head and Neck: No lymphadenopathy.   Chest: Normal breathing, Normal shape and excursion.   Scalp: Bolster noticed at the vertex of the scalp.  The size of the bolster is approximately 3 x 3 cm.  Small satellite lesions/wounds noticed as well. Currently infected, signs of cellulitis around the bolster. No basin lymphadenopathy.   Wound Bed Debridement  After obtaining verbal consent from the patient, the wound bed was thoroughly assessed and showed signs of infection below and around the bolster. The bolster was removed. The necrotic tissue was debrided sharply with scissors to create a clean, viable base, and any infected tissue is removed to reduce the risk of graft failure. Hemostasis is achieved to prevent bleeding complications during the grafting process. The wound is then thoroughly irrigated with Vashe, and the area is prepped with antiseptic solutions to minimize the risk of infection. We will covered with a moist dressing (Vashe) in preparation for skin substitute versus flap once infection resolved.   Wound size 3.0 x 3.5 cm  Assessment and plan Pt. presents with a chronic  wound at the vertex of the scalp status post Mohs surgery for squamous cell carcinoma, currently infected (Cellulitis).   - Wound debrided at bedside. Wet to dry dressings with Vashe TID (Discussed with nursing) - Continue IV antibiotics during the weekend and as we wait for sensitivities. Once infection resolves we will discuss reconstructive options with patient and family -  Appreciate ID recommendations - Rest of care per Internal Medicine - Patient questions answered to patients satisfaction - If any questions after hours please contact plastic surgeon on call.   Shams Fill M. Abdulkadir Emmanuel, MD [facility]

## 2023-10-27 NOTE — Progress Notes (Signed)
 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  She was started on Vancomycin.    Assessment and Plan:  Scalp cellulitis s/p Mohs of SCC of the scalp Slow healing wound following Mohs on 9/9, healing by second intention s/p string suture with bolster packing Followed by dermatology (primarily) and plastic surgeon (for graft placement pending insurance approval)   Sent for admission by dermatology clinic on 10/24 for IV antibiotics Blood cultures NTD CT head with mildly increased soft tissue thickening and ulceration at the vertex, which is nonspecific but could represent progressive malignancy, infection, and/or scar tissue. No discrete/drainable fluid collection or significant skull erosion. Treating with IV Vancomycin for now ID consulted Plastic surgery consulted for possible wound washout   History of recurrent C. difficile colitis Diagnosed with 3rd episode on 10/4 Completed 3 week course of fidaxomicin on 10/20 Diarrhea has been improving with none in 2-3 weeks Patient reported 1 episode of loose stool on 10/24, none since Enteric precautions for now, will defer to ID about whether these are still needed   CKD stage IIIb Appears to be stable at this time Attempt to avoid nephrotoxic medications Recheck BMP in AM    Essential hypertension Spironolactone and hydrochlorothiazide on hold Continue Toprol-XL Hold torsemide   Hyperlipidemia Continue Zocor    Hypothyroidism Continue levothyroxine    Insomnia Continue trazodone  Gout Continue allopurinol   Chronic pain I have  reviewed this patient in the [place] Controlled Substances Reporting System.  She is receiving medications from only one provider and appears to be taking them as prescribed. She is not at particularly high risk of opioid misuse, diversion, or overdose.  Continue tramadol       Consultants: ID Plastic surgery  Procedures: None  Antibiotics: Vancomycin 10/23-  30 Day Unplanned Readmission Risk Score    Flowsheet Row ED to Hosp-Admission (Current) from 10/26/2023 in [facility] General Surgery  30 Day Unplanned Readmission Risk Score (%) 14.19 Filed at 10/27/2023 0400    This score is the patient's risk of an unplanned readmission within 30 days of being discharged (0 -100%). The score is based on dignosis, age, lab data, medications, orders, and past utilization.   Low:  0-14.9   Medium: 15-21.9   High: 22-29.9   Extreme: 30 and above           Subjective: Patient with h/o Mohs surgery.  Also concomitant C diff infection which has been treated.  She reports resolution of diarrhea for the last 2-3 weeks other than 1 loose stool yesterday due to nerves.  Her head lesion has a bolster sewn onto the scalp and there is concern for pus underlying the bolster.  It is very painful.    Objective: Vitals:   10/27/23 0941 10/27/23 1319  BP: 105/89 116/67  Pulse: 83 85  Resp: 16 18  Temp: 98.6 F (37 C) 98.5 F (36.9 C)  SpO2: 99% 100%    Intake/Output Summary (Last 24 hours) at 10/27/2023 1718 Last data filed at 10/27/2023 1400 Gross per 24 hour  Intake 680 ml  Output 850 ml  Net -170 ml   There  were no vitals filed for this visit.  Exam:  General:  Appears calm and comfortable and is in NAD, up in chair Eyes:  normal lids, iris ENT:  grossly normal hearing, lips & tongue, mmm Cardiovascular:  RRR. No LE edema.  Respiratory:   CTA bilaterally with no wheezes/rales/rhonchi.  Normal respiratory effort. Abdomen:  soft, NT, ND Skin:  large head wound with bolster packing  sewn in place  Musculoskeletal:  grossly normal tone BUE/BLE, good ROM, no bony abnormality Psychiatric:  grossly normal mood and affect, speech fluent and appropriate, AOx3 Neurologic:  CN 2-12 grossly intact, moves all extremities in coordinated fashion  Data Reviewed: I have reviewed the patient's lab results since admission.  Pertinent labs for today include:   BUN 19/Creatinine 1.12/GFR 50, stable WBC 8.1 Blood cultures NTD C diff positive on 10/4    Family Communication: Daughter was present throughout     Code Status: Full Code  Disposition: Status is: Inpatient Remains inpatient appropriate because: ongoing management     Time spent: 50 minutes  Unresulted Labs (From admission, onward)     Start     Ordered   10/28/23 0500  CK  Weekly,   R (with TIMED occurrences)      10/27/23 1128   10/26/23 2337  Culture, blood (Routine X 2) w Reflex to ID Panel  BLOOD CULTURE X 2,   R (with STAT occurrences)      10/26/23 2336             Author: Delon Herald, MD 10/27/2023 5:18 PM  For on call review [email or web address].

## 2023-10-27 NOTE — Consult Note (Signed)
 NAME: Sheryl Kennedy  DOB: 08-09-1945  MRN: [id number]  Date/Time: 10/27/2023 2:48 PM  REQUESTING PROVIDER: Dr.Yates Subjective:  REASON FOR CONSULT: Infected scalp wound ? Sheryl Kennedy is a 78 y.o. female with a history of hyperlipidemia, bilateral knee replacement, chronic diarrhea, C. difficile SCC underwent MOH is admitted to the hospital for infected scalp wound with surrounding cellulitis.   Patient underwent Mohs surgery for squamous cell carcinoma of the scalp on 09/12/2023.  She has been having follow-up with her Mohs surgeon.  The scalp wound was healing very slowly with very minimal granulation tissue.  The plan was to do a graft and she was referred to the plastic surgeon.  This week when she was evaluated in the Derm clinic she was found to have some cellulitis around the wound as well as tenderness and hence was referred to the ED for admission.  The patient's history is complicated by chronic diarrhea since July 2025.  she has taken a few courses of antibiotics.       A 10-day course of cefdinir in May 2025.  A 10-day course of cefuroxime July 18, 2023.  3-day supply of amoxicillin .given August 21, 2023. She tested positive for C. difficile on 07/26/2023 for the first time and was given 14-day supply of vancomycin.  Symptoms responded to the treatment only for the diarrhea to recur again.  She tested for C. difficile PCR  again on September 05, 2023 and given a second course of 14-day vancomycin. She underwent a Mohs surgery 09/12/2023 She was hospitalized to [facility] ED between 10/06/2023 until 10/08/2023 for dizziness and weakness and found to have hyponatremia, AKI with a creatinine of 2.30.  Her stool test showed a C. difficile antigen positive but toxin was negative and the PCR was positive.  She was given a course of fidaxomicin taper which she just completed.  Her diarrhea has resolved. She does not have any fever or chills  Vitals in the ED BP of 141/92, temperature 98.7, pulse 79 and  respiratory rate 18 WBC 7.8, Hb 12.6, creatinine 1.16 and platelet 360 She is being admitted for getting IV antibiotics.  I am consulted for the same  Past Medical History:  Diagnosis Date   Arthritis    Cancer (HCC) 08/2012   skin right anterior lower leg, squamous cell   DSAP (disseminated superficial actinic porokeratosis)    Gout    Hx of seasonal allergies    Hyperlipidemia    Hypertension    SCC (squamous cell carcinoma) 07/31/2023   superfical infiltration, R crown scalp, referral to Dr. Corey for [facility]   SCC (squamous cell carcinoma) 07/31/2023   R lateral lower leg, EDC 09/11/23   Squamous cell carcinoma of skin    R anterior lower leg   Squamous cell carcinoma of skin 12/12/2022   SCC/KA type. Left forearm. Excision 02/01/2023   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 complications     Past Surgical History:  Procedure Laterality Date   BACK SURGERY  2008   no metal in back   BREAST BIOPSY Right 09/13/2010   core/neg   BREAST BIOPSY Right 12/09/2015   us  biopsy/neg   BREAST SURGERY Right 2012   CATARACT EXTRACTION W/PHACO Left 04/25/2022   Procedure: CATARACT EXTRACTION PHACO AND INTRAOCULAR LENS PLACEMENT (IOC) LEFT 3.42 00:25.2;  Surgeon: Myrna Adine Anes, MD;  Location: [facility];  Service: Ophthalmology;  Laterality: Left;   CATARACT EXTRACTION W/PHACO Right 05/23/2022   Procedure: CATARACT EXTRACTION PHACO AND  INTRAOCULAR LENS PLACEMENT (IOC) RIGHT 3.84 00:28.1;  Surgeon: Myrna Adine Anes, MD;  Location: [facility];  Service: Ophthalmology;  Laterality: Right;   CHONDROPLASTY Left 01/22/2016   Procedure: arthroscopic medial AND LATERAL CHONDROPLASTY;  Surgeon: Lynwood SHAUNNA Hue, MD;  Location: [facility];  Service: Orthopedics;  Laterality: Left;   COLONOSCOPY  2011   COLONOSCOPY WITH PROPOFOL  N/A 04/14/2015   Procedure: COLONOSCOPY WITH PROPOFOL ;  Surgeon: Gladis RAYMOND Mariner, MD;  Location: [facility];  Service: Endoscopy;  Laterality: N/A;    CYST REMOVAL HAND Left 2012   JOINT REPLACEMENT Right 09/30/2013   TOTAL KNEE REPLACEMENT, DR. HUE, [facility]   KNEE ARTHROSCOPY WITH MEDIAL MENISECTOMY Left 01/22/2016   Procedure: KNEE ARTHROSCOPY WITH MEDIAL AND LATERAL MENISECTOMY;  Surgeon: Lynwood SHAUNNA Hue, MD;  Location: [facility];  Service: Orthopedics;  Laterality: Left;   KNEE SURGERY  1997   LEFT HEART CATH AND CORONARY ANGIOGRAPHY N/A 10/25/2021   Procedure: LEFT HEART CATH AND CORONARY ANGIOGRAPHY;  Surgeon: Mady Bruckner, MD;  Location: [facility] INVASIVE CV LAB;  Service: Cardiovascular;  Laterality: N/A;   Stab pheblectomy  Left 2013   TONSILLECTOMY     VEIN CLOSURE Bilateral 2012    Social History   Socioeconomic History   Marital status: Married    Spouse name: Not on file   Number of children: Not on file   Years of education: Not on file   Highest education level: Not on file  Occupational History   Not on file  Tobacco Use   Smoking status: Never   Smokeless tobacco: Never  Substance and Sexual Activity   Alcohol use: Yes    Comment: occassional   Drug use: No   Sexual activity: Not Currently  Other Topics Concern   Not on file  Social History Narrative   Not on file   Social Drivers of Health   Financial Resource Strain: Medium Risk (07/25/2023)   Received from [facility]   Overall Financial Resource Strain (CARDIA)    Difficulty of Paying Living Expenses: Somewhat hard  Food Insecurity: No Food Insecurity (10/27/2023)   Hunger Vital Sign    Worried About Running Out of Food in the Last Year: Never true    Ran Out of Food in the Last Year: Never true  Transportation Needs: No Transportation Needs (10/27/2023)   PRAPARE - Administrator, Civil Service (Medical): No    Lack of Transportation (Non-Medical): No  Physical Activity: Not on file  Stress: Not on file  Social Connections: Socially Integrated (10/27/2023)   Social Connection and Isolation Panel    Frequency of  Communication with Friends and Family: More than three times a week    Frequency of Social Gatherings with Friends and Family: Three times a week    Attends Religious Services: More than 4 times per year    Active Member of Clubs or Organizations: Yes    Attends Banker Meetings: More than 4 times per year    Marital Status: Married  Catering Manager Violence: Not At Risk (10/27/2023)   Humiliation, Afraid, Rape, and Kick questionnaire    Fear of Current or Ex-Partner: No    Emotionally Abused: No    Physically Abused: No    Sexually Abused: No    Family History  Problem Relation Age of Onset   Heart failure Mother    Heart disease Mother    Arthritis Mother    Colon cancer Paternal Grandmother  Breast cancer Neg Hx    Allergies  Allergen Reactions   Ery-Tab [Erythromycin] Other (See Comments)    Unknown reaction   Fosamax [Alendronate] Other (See Comments)    Possible cause of hypercalcemia   Levaquin [Levofloxacin] Other (See Comments)     Dizziness    Aleve [Naproxen] Swelling and Other (See Comments)    Arthralgias  Swelling in hands/joints, also   Shellfish Allergy Swelling, Dermatitis and Other (See Comments)    Arthralgias Joint swelling, also Gout flares   Sudafed [Pseudoephedrine] Rash and Other (See Comments)    Rhinitis Sneezing   Sulfa Antibiotics Rash   I? Current Facility-Administered Medications  Medication Dose Route Frequency Provider Last Rate Last Admin   0.9 %  sodium chloride  infusion  250 mL Intravenous PRN Sundil, Subrina, MD       acetaminophen  (TYLENOL ) tablet 650 mg  650 mg Oral Q6H PRN Sundil, Subrina, MD   650 mg at 10/27/23 1022   Or   acetaminophen  (TYLENOL ) suppository 650 mg  650 mg Rectal Q6H PRN Sundil, Subrina, MD       allopurinol  (ZYLOPRIM ) tablet 300 mg  300 mg Oral Daily Sundil, Subrina, MD   300 mg at 10/27/23 9092   cyanocobalamin  (VITAMIN B12) tablet 1,000 mcg  1,000 mcg Oral Daily Sundil, Subrina, MD   1,000  mcg at 10/27/23 0907   [START ON 10/28/2023] DAPTOmycin (CUBICIN) IVPB 500 mg/62mL premix  8 mg/kg Intravenous Q1400 [place] Bodily, MD       heparin  injection 5,000 Units  5,000 Units Subcutaneous Q8H Sundil, Subrina, MD   5,000 Units at 10/27/23 0518   levothyroxine  (SYNTHROID ) tablet 50 mcg  50 mcg Oral QAC breakfast Sundil, Subrina, MD   50 mcg at 10/27/23 0518   metoprolol succinate (TOPROL-XL) 24 hr tablet 25 mg  25 mg Oral Daily Sundil, Subrina, MD       ondansetron  (ZOFRAN ) tablet 4 mg  4 mg Oral Q6H PRN Sundil, Subrina, MD       Or   ondansetron  (ZOFRAN ) injection 4 mg  4 mg Intravenous Q6H PRN Sundil, Subrina, MD       oxyCODONE  (Oxy IR/ROXICODONE ) immediate release tablet 5 mg  5 mg Oral Q6H PRN Sundil, Subrina, MD       simvastatin  (ZOCOR ) tablet 20 mg  20 mg Oral QHS Sundil, Subrina, MD   20 mg at 10/27/23 0253   sodium chloride  flush (NS) 0.9 % injection 3 mL  3 mL Intravenous Q12H Sundil, Subrina, MD   3 mL at 10/27/23 0912   sodium chloride  flush (NS) 0.9 % injection 3 mL  3 mL Intravenous Q12H Sundil, Subrina, MD   3 mL at 10/26/23 2346   sodium chloride  flush (NS) 0.9 % injection 3 mL  3 mL Intravenous PRN Sundil, Subrina, MD       traZODone (DESYREL) tablet 25 mg  25 mg Oral QHS Sundil, Subrina, MD         Abtx:  Anti-infectives (From admission, onward)    Start     Dose/Rate Route Frequency Ordered Stop   10/28/23 1500  vancomycin (VANCOCIN) IVPB 1000 mg/200 mL premix  Status:  Discontinued        1,000 mg 200 mL/hr over 60 Minutes Intravenous Every 36 hours 10/27/23 0002 10/27/23 1128   10/28/23 1400  DAPTOmycin (CUBICIN) IVPB 500 mg/39mL premix        8 mg/kg  59.2 kg 100 mL/hr over 30 Minutes Intravenous Daily 10/27/23 1128  10/27/23 1000  fidaxomicin (DIFICID) tablet 200 mg  Status:  Discontinued        200 mg Oral Every other day 10/26/23 2330 10/27/23 0013   10/26/23 2345  cefTRIAXone (ROCEPHIN) 2 g in sodium chloride  0.9 % 100 mL IVPB  Status:   Discontinued        2 g 200 mL/hr over 30 Minutes Intravenous Every 24 hours 10/26/23 2332 10/26/23 2332   10/26/23 2315  vancomycin (VANCOCIN) IVPB 1000 mg/200 mL premix        1,000 mg 200 mL/hr over 60 Minutes Intravenous  Once 10/26/23 2314 10/27/23 0354       REVIEW OF SYSTEMS:  Const: negative fever, negative chills, negative weight loss Eyes: negative diplopia or visual changes, negative eye pain ENT: negative coryza, negative sore throat Resp: negative cough, hemoptysis, dyspnea Cards: negative for chest pain, palpitations, lower extremity edema GU: negative for frequency, dysuria and hematuria GI: as above Skin: n\scalp wound Heme: negative for easy bruising and gum/nose bleeding MS: negative for myalgias, arthralgias, back pain and muscle weakness Neurolo:negative for headaches, dizziness, vertigo, memory problems  Psych: negative for feelings of anxiety, depression  Endocrine: negative for thyroid, diabetes Allergy/Immunology-multiple allergies Antibiotic allergies Levaquin dizziness and Bactrim rash Objective:  VITALS:  BP 116/67 (BP Location: Left Arm)   Pulse 85   Temp 98.5 F (36.9 C) (Oral)   Resp 18   SpO2 100%   PHYSICAL EXAM:  General: Alert, cooperative, no distress, appears stated age.  Head: The Mohs surgical wound is covered with a bolster Surrounding skin is erythematous On pressing the bolster there is creamy puslike fluid emerging on pressure the wound is very tender   Eyes: Conjunctivae clear, anicteric sclerae. Pupils are equal ENT Nares normal. No drainage or sinus tenderness. Lips, mucosa, and tongue normal. No Thrush Neck: Supple, symmetrical, no adenopathy, thyroid: non tender no carotid bruit and no JVD. Back: No CVA tenderness. Lungs: Clear to auscultation bilaterally. No Wheezing or Rhonchi. No rales. Heart: Regular rate and rhythm, no murmur, rub or gallop. Abdomen: Soft, non-tender,not distended. Bowel sounds normal. No  masses Extremities: atraumatic, no cyanosis. No edema. No clubbing Skin: No rashes or lesions. Or bruising Lymph: Cervical, supraclavicular normal. Neurologic: Grossly non-focal Pertinent Labs Lab Results CBC    Component Value Date/Time   WBC 8.1 10/27/2023 0056   RBC 3.97 10/27/2023 0056   HGB 12.6 10/27/2023 0056   HGB 11.6 (L) 10/02/2013 0505   HCT 37.6 10/27/2023 0056   HCT 43.1 09/18/2013 0919   PLT 326 10/27/2023 0056   PLT 212 10/02/2013 0505   MCV 94.7 10/27/2023 0056   MCV 96 09/18/2013 0919   MCH 31.7 10/27/2023 0056   MCHC 33.5 10/27/2023 0056   RDW 16.0 (H) 10/27/2023 0056   RDW 16.0 (H) 09/18/2013 0919   LYMPHSABS 1.7 10/26/2023 2214   MONOABS 0.8 10/26/2023 2214   EOSABS 0.4 10/26/2023 2214   BASOSABS 0.1 10/26/2023 2214       Latest Ref Rng & Units 10/27/2023    5:00 AM 10/26/2023   10:14 PM 10/08/2023    9:25 AM  CMP  Glucose 70 - 99 mg/dL 92  864  891   BUN 8 - 23 mg/dL 19  16  24    Creatinine 0.44 - 1.00 mg/dL 8.87  8.83  8.85   Sodium 135 - 145 mmol/L 134  135  130   Potassium 3.5 - 5.1 mmol/L 3.6  3.9  3.9   Chloride 98 -  111 mmol/L 95  94  93   CO2 22 - 32 mmol/L 27  29  29    Calcium 8.9 - 10.3 mg/dL 9.9  89.4  9.5   Total Protein 6.5 - 8.1 g/dL 5.4     Total Bilirubin 0.0 - 1.2 mg/dL 0.4     Alkaline Phos 38 - 126 U/L 69     AST 15 - 41 U/L 25     ALT 0 - 44 U/L 15         Microbiology: Recent Results (from the past 240 hours)  Culture, blood (Routine X 2) w Reflex to ID Panel     Status: None (Preliminary result)   Collection Time: 10/27/23 12:06 AM   Specimen: BLOOD  Result Value Ref Range Status   Specimen Description   Final    BLOOD RIGHT ANTECUBITAL Performed at [facility], [address] [place], [place] [address]    Special Requests   Final    Blood Culture adequate volume BOTTLES DRAWN AEROBIC AND ANAEROBIC Performed at [facility], [address] [place], [place] [address]     Culture   Final    NO GROWTH < 12 HOURS Performed at [facility], [address], [place], [place] [address]    Report Status PENDING  Incomplete    IMAGING RESULTS: CT head  Mildly increased soft tissue thickening and ulceration at the vertex which is nonspecific but could represent progressive malignancy infection and/or scar tissue.  No evidence of skull erosion I have personally reviewed the films ? Impression/Recommendation ? Squamous cell carcinoma of the scalp status post Mohs surgery. Cellulitis of the scalp and infected wound There is a bolster On pressing the edge of the wound there was creamish discharge-  culture taken On discussion with the plastic surgeon and dermatologist the whitish material is the skin substitute. Will look for MRSA/MSSA or Pseudomonas  which are not normal skin colonization bacteria Will start the patient on daptomycin and cefepime  Because of recent C. difficile will give a prophylactic vancomycin 125 mg p.o. twice daily Discussed with plastic surgeon to remove the bolster to look at the underlying wound and do a washout to decrease the bioburden   History of recurrent C. difficile patient just completed a course of fidaxomicin.  Will keep her on empiric vancomycin 125 mg p.o. twice daily  Hypothyroidism on Synthroid   CKD Hyperlipidemia on simvastatin  Hypertension on metoprolol  This consult involved complex antimicrobial management.   I have personally spent  -75--minutes involved in face-to-face and non-face-to-face activities for this patient on the day of the visit. Professional time spent includes the following activities: Preparing to see the patient (review of tests), Obtaining and/or reviewing separately obtained history (admission/discharge record), Performing a medically appropriate examination and/or evaluation , Ordering medications/tests/procedures, referring and communicating with other health care professionals, Documenting  clinical information in the EMR, Independently interpreting results (not separately reported), Communicating results to the patient/family/ Counseling and educating the patient/family/ and Care coordination with plastic surgeon, Mohs surgeon and hospitalist  ________________________________________________  Note:  This document was prepared using Dragon voice recognition software and may include unintentional dictation errors.

## 2023-10-28 DIAGNOSIS — Z85828 Personal history of other malignant neoplasm of skin: Secondary | ICD-10-CM | POA: Diagnosis not present

## 2023-10-28 DIAGNOSIS — L03811 Cellulitis of head [any part, except face]: Secondary | ICD-10-CM | POA: Diagnosis not present

## 2023-10-28 DIAGNOSIS — S0100XA Unspecified open wound of scalp, initial encounter: Secondary | ICD-10-CM | POA: Diagnosis not present

## 2023-10-28 LAB — CBC WITH DIFFERENTIAL/PLATELET
Abs Immature Granulocytes: 0.12 K/uL — ABNORMAL HIGH (ref 0.00–0.07)
Basophils Absolute: 0.1 K/uL (ref 0.0–0.1)
Basophils Relative: 1 %
Eosinophils Absolute: 0.5 K/uL (ref 0.0–0.5)
Eosinophils Relative: 8 %
HCT: 35.8 % — ABNORMAL LOW (ref 36.0–46.0)
Hemoglobin: 11.5 g/dL — ABNORMAL LOW (ref 12.0–15.0)
Immature Granulocytes: 2 %
Lymphocytes Relative: 19 %
Lymphs Abs: 1.1 K/uL (ref 0.7–4.0)
MCH: 29.9 pg (ref 26.0–34.0)
MCHC: 32.1 g/dL (ref 30.0–36.0)
MCV: 93.2 fL (ref 80.0–100.0)
Monocytes Absolute: 0.6 K/uL (ref 0.1–1.0)
Monocytes Relative: 11 %
Neutro Abs: 3.3 K/uL (ref 1.7–7.7)
Neutrophils Relative %: 59 %
Platelets: 301 K/uL (ref 150–400)
RBC: 3.84 MIL/uL — ABNORMAL LOW (ref 3.87–5.11)
RDW: 15.9 % — ABNORMAL HIGH (ref 11.5–15.5)
WBC: 5.7 K/uL (ref 4.0–10.5)
nRBC: 0 % (ref 0.0–0.2)

## 2023-10-28 LAB — BASIC METABOLIC PANEL WITH GFR
Anion gap: 10 (ref 5–15)
BUN: 14 mg/dL (ref 8–23)
CO2: 27 mmol/L (ref 22–32)
Calcium: 10.4 mg/dL — ABNORMAL HIGH (ref 8.9–10.3)
Chloride: 95 mmol/L — ABNORMAL LOW (ref 98–111)
Creatinine, Ser: 0.95 mg/dL (ref 0.44–1.00)
GFR, Estimated: >60 mL/min (ref >60)
Glucose, Bld: 97 mg/dL (ref 70–99)
Potassium: 3.5 mmol/L (ref 3.5–5.1)
Sodium: 132 mmol/L — ABNORMAL LOW (ref 135–145)

## 2023-10-28 LAB — CK: Total CK: 28 U/L — ABNORMAL LOW (ref 38–234)

## 2023-10-28 NOTE — Progress Notes (Addendum)
 Subjective: 78 yo F with a chronic wound at the vertex of the scalp status post Mohs surgery for squamous cell carcinoma, currently infected (Cellulitis), improving after bolster removal and sharp wound debridement at bedside.   Patient afebrile and non-toxic.  Objective: Vital signs in last 24 hours: Temp:  [98.5 F (36.9 C)-98.6 F (37 C)] 98.5 F (36.9 C) (10/25 0944) Pulse Rate:  [81-91] 91 (10/25 0944) Resp:  [16-18] 18 (10/25 0944) BP: (116-133)/(67-71) 125/71 (10/25 0944) SpO2:  [96 %-100 %] 100 % (10/25 0944)    Intake/Output from previous day: 10/24 0701 - 10/25 0700 In: 675 [P.O.:575; IV Piggyback:100] Out: 1800 [Urine:1800] Intake/Output this shift: Total I/O In: 60 [P.O.:60] Out: -   General appearance: alert, cooperative, and no distress Scalp Wound size 3.0 x 3.5 cm, erythematous edges, improving with local wound care.     Lab Results:  CBC    Component Value Date/Time   WBC 5.7 10/28/2023 0541   RBC 3.84 (L) 10/28/2023 0541   HGB 11.5 (L) 10/28/2023 0541   HGB 11.6 (L) 10/02/2013 0505   HCT 35.8 (L) 10/28/2023 0541   HCT 43.1 09/18/2013 0919   PLT 301 10/28/2023 0541   PLT 212 10/02/2013 0505   MCV 93.2 10/28/2023 0541   MCV 96 09/18/2013 0919   MCH 29.9 10/28/2023 0541   MCHC 32.1 10/28/2023 0541   RDW 15.9 (H) 10/28/2023 0541   RDW 16.0 (H) 09/18/2013 0919   LYMPHSABS 1.1 10/28/2023 0541   MONOABS 0.6 10/28/2023 0541   EOSABS 0.5 10/28/2023 0541   BASOSABS 0.1 10/28/2023 0541    BMET Recent Labs    10/27/23 0500 10/28/23 0541  NA 134* 132*  K 3.6 3.5  CL 95* 95*  CO2 27 27  GLUCOSE 92 97  BUN 19 14  CREATININE 1.12* 0.95  CALCIUM 9.9 10.4*    Studies/Results: CT Head W or Wo Contrast Result Date: 10/27/2023 EXAM: CT HEAD WITHOUT AND WITH CONTRAST 10/27/2023 12:02:58 AM TECHNIQUE: CT of the head was performed without and with the administration of intravenous contrast (60mL iohexol  (OMNIPAQUE ) 300 MG/ML solution). Automated  exposure control, iterative reconstruction, and/or weight based adjustment of the mA/kV was utilized to reduce the radiation dose to as low as reasonably achievable. COMPARISON: CT head 10/06/2023 CLINICAL HISTORY: Skin cancer, assess treatment response; moh's procedure on scalp, concern of infeciton. FINDINGS: BRAIN AND VENTRICLES: No acute intracranial hemorrhage. No mass effect or midline shift. No extra-axial fluid collection. No evidence of acute infarct. No hydrocephalus. No abnormal enhancement. ORBITS: No acute abnormality. SINUSES AND MASTOIDS: No acute abnormality. SOFT TISSUES AND SKULL: Mildly increased soft tissue thickening and ulceration at the vertex. No discrete/drainable fluid collection or significant skull erosion.c IMPRESSION: 1. Mildly increased soft tissue thickening and ulceration at the vertex, which is nonspecific but could represent progressive malignancy, infection, and/or scar tissue. No discrete/drainable fluid collection or significant skull erosion. 2. No evidence of acute intracranial abnormality. Electronically signed by: Gilmore Molt MD 10/27/2023 12:24 AM EDT RP Workstation: [id number]    Anti-infectives: Anti-infectives (From admission, onward)    Start     Dose/Rate Route Frequency Ordered Stop   10/28/23 1500  vancomycin (VANCOCIN) IVPB 1000 mg/200 mL premix  Status:  Discontinued        1,000 mg 200 mL/hr over 60 Minutes Intravenous Every 36 hours 10/27/23 0002 10/27/23 1128   10/28/23 1400  DAPTOmycin (CUBICIN) IVPB 500 mg/13mL premix        8 mg/kg  59.2 kg 100  mL/hr over 30 Minutes Intravenous Daily 10/27/23 1128     10/27/23 2200  vancomycin (VANCOCIN) capsule 125 mg        125 mg Oral 2 times daily 10/27/23 1826     10/27/23 2000  ceFEPIme (MAXIPIME) 1 g in sodium chloride  0.9 % 100 mL IVPB        1 g 200 mL/hr over 30 Minutes Intravenous Every 12 hours 10/27/23 1815     10/27/23 1000  fidaxomicin (DIFICID) tablet 200 mg  Status:  Discontinued         200 mg Oral Every other day 10/26/23 2330 10/27/23 0013   10/26/23 2345  cefTRIAXone (ROCEPHIN) 2 g in sodium chloride  0.9 % 100 mL IVPB  Status:  Discontinued        2 g 200 mL/hr over 30 Minutes Intravenous Every 24 hours 10/26/23 2332 10/26/23 2332   10/26/23 2315  vancomycin (VANCOCIN) IVPB 1000 mg/200 mL premix        1,000 mg 200 mL/hr over 60 Minutes Intravenous  Once 10/26/23 2314 10/27/23 0354       Assessment/Plan: LOS: 2 days   Cellulitis of scalp - Wet to dry dressings with Vashe TID - Continue IV antibiotics during the weekend and as we wait for sensitivities. Once infection resolves we will discuss reconstructive options with patient and family.  - Appreciate ID recommendations - Rest of care per Internal Medicine - Patient questions answered to patients satisfaction - If any questions after hours please contact plastic surgeon on call.   Moncerrath Berhe M Biana Haggar 10/28/2023

## 2023-10-28 NOTE — Plan of Care (Signed)
  Problem: Education: Goal: Knowledge of General Education information will improve Description Including pain rating scale, medication(s)/side effects and non-pharmacologic comfort measures Outcome: Progressing   Problem: Health Behavior/Discharge Planning: Goal: Ability to manage health-related needs will improve Outcome: Progressing   Problem: Clinical Measurements: Goal: Ability to maintain clinical measurements within normal limits will improve Outcome: Progressing Goal: Will remain free from infection Outcome: Progressing Goal: Diagnostic test results will improve Outcome: Progressing Goal: Respiratory complications will improve Outcome: Progressing Goal: Cardiovascular complication will be avoided Outcome: Progressing   Problem: Activity: Goal: Risk for activity intolerance will decrease Outcome: Progressing   Problem: Elimination: Goal: Will not experience complications related to bowel motility Outcome: Progressing Goal: Will not experience complications related to urinary retention Outcome: Progressing   Problem: Safety: Goal: Ability to remain free from injury will improve Outcome: Progressing   

## 2023-10-28 NOTE — Progress Notes (Signed)
 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  She was started on IV Daptomycin and Cefepime per ID with PO Vancomycin for empiric prevention of recurrent C diff.    Plastic surgery did bedside I&D and she is improving.  Assessment and Plan:  Scalp cellulitis s/p Mohs of SCC of the scalp Slow healing wound following Mohs on 9/9, healing by second intention s/p string suture with bolster packing Followed by dermatology (primarily) and plastic surgeon (for graft placement pending insurance approval)   Sent for admission by dermatology clinic on 10/24 for IV antibiotics Blood cultures NTD CT head with mildly increased soft tissue thickening and ulceration at the vertex, which is nonspecific but could represent progressive malignancy, infection, and/or scar tissue. No discrete/drainable fluid collection or significant skull erosion. ID consulted Treating with IV Daptomycin and Cefepime per ID Plastic surgery consulted and removed the bolster, debrided at bedside Wet to dry dressing with Vashe TID Can discuss reconstructive options once the infection resolves   History of recurrent C. difficile colitis Diagnosed with 3rd episode on 10/4 Completed 3 week course of fidaxomicin on 10/20 Diarrhea has been improving with none in 2-3 weeks Patient reported 1 episode of loose stool on 10/24, none since Enteric precautions discontinued per ID ID has ordered empiric PO Vanc   CKD stage IIIb Appears to be stable at this time Attempt to avoid nephrotoxic medications Recheck BMP in AM   Chronic  hyponatremia Dating back to 2015 Not clinically significant    Essential hypertension Spironolactone and hydrochlorothiazide on hold Continue Toprol-XL Hold torsemide   Hyperlipidemia Continue Zocor    Hypothyroidism Continue levothyroxine    Insomnia Continue trazodone   Gout Continue allopurinol    Chronic pain I have reviewed this patient in the [place] Controlled Substances Reporting System.  She is receiving medications from only one provider and appears to be taking them as prescribed. She is not at particularly high risk of opioid misuse, diversion, or overdose.  Continue tramadol            Consultants: ID Plastic surgery   Procedures: None   Antibiotics: Vancomycin 10/23-  30 Day Unplanned Readmission Risk Score    Flowsheet Row ED to Hosp-Admission (Current) from 10/26/2023 in [facility]  30 Day Unplanned Readmission Risk Score (%) 15.03 Filed at 10/28/2023 0400    This score is the patient's risk of an unplanned readmission within 30 days of being discharged (0 -100%). The score is based on dignosis, age, lab data, medications, orders, and past utilization.   Low:  0-14.9   Medium: 15-21.9   High: 22-29.9   Extreme: 30 and above           Subjective: Feeling better - pain is improved, no systemic symptoms.   Objective: Vitals:   10/28/23 0944 10/28/23 1404  BP: 125/71 105/60  Pulse: 91 84  Resp: 18 18  Temp: 98.5 F (36.9 C) 98.9 F (37.2 C)  SpO2: 100% 98%    Intake/Output Summary (Last 24 hours) at 10/28/2023 1421 Last data filed at 10/28/2023 1000 Gross per 24 hour  Intake 735 ml  Output 1400 ml  Net -665 ml   There were no vitals filed for this visit.  Exam:  General:  Appears calm and comfortable and is in NAD, up in chair Eyes:  normal lids, iris ENT:  grossly normal hearing, lips & tongue, mmm Cardiovascular:  RRR. No LE edema.  Respiratory:   CTA bilaterally with no wheezes/rales/rhonchi.  Normal  respiratory effort. Abdomen:  soft, NT, ND Skin:  large vertex head wound as below  Musculoskeletal:  grossly normal tone BUE/BLE, good ROM, no bony abnormality Psychiatric:  grossly normal mood and affect, speech fluent and appropriate, AOx3 Neurologic:  CN 2-12 grossly intact, moves all extremities in coordinated fashion  Data Reviewed: I have reviewed the patient's lab results since admission.  Pertinent labs for today include:   Na++ 132 CK 28 WBC 5.7 Hgb 11.5 Blood cultures NTD Wound culture with GPC in pairs (strep)    Family Communication: Husband was present      Code Status: Full Code     Disposition: Status is: Inpatient Remains inpatient appropriate because: ongoing management     Time spent: 50 minutes  Unresulted Labs (From admission, onward)     Start     Ordered   10/28/23 0500  CK  Weekly,   R      10/27/23 1128             Author: Delon Herald, MD 10/28/2023 2:21 PM  For on call review [email or web address].

## 2023-10-29 ENCOUNTER — Other Ambulatory Visit (HOSPITAL_COMMUNITY): Payer: Self-pay

## 2023-10-29 DIAGNOSIS — C4442 Squamous cell carcinoma of skin of scalp and neck: Secondary | ICD-10-CM | POA: Diagnosis not present

## 2023-10-29 DIAGNOSIS — Z85828 Personal history of other malignant neoplasm of skin: Secondary | ICD-10-CM | POA: Diagnosis not present

## 2023-10-29 DIAGNOSIS — A0471 Enterocolitis due to Clostridium difficile, recurrent: Secondary | ICD-10-CM | POA: Diagnosis not present

## 2023-10-29 DIAGNOSIS — S0100XA Unspecified open wound of scalp, initial encounter: Secondary | ICD-10-CM | POA: Diagnosis not present

## 2023-10-29 DIAGNOSIS — L089 Local infection of the skin and subcutaneous tissue, unspecified: Principal | ICD-10-CM

## 2023-10-29 DIAGNOSIS — L03811 Cellulitis of head [any part, except face]: Secondary | ICD-10-CM | POA: Diagnosis not present

## 2023-10-29 MED ORDER — SACCHAROMYCES BOULARDII 250 MG PO CAPS
250.0000 mg | ORAL_CAPSULE | Freq: Two times a day (BID) | ORAL | 0 refills | Status: AC
  Filled 2023-10-29: qty 60, 30d supply, fill #0

## 2023-10-29 MED ORDER — SACCHAROMYCES BOULARDII 250 MG PO CAPS: 250.0000 mg | ORAL_CAPSULE | Freq: Two times a day (BID) | ORAL | Status: DC

## 2023-10-29 MED ORDER — LINEZOLID 600 MG PO TABS
600.0000 mg | ORAL_TABLET | Freq: Two times a day (BID) | ORAL | 0 refills | Status: AC
  Filled 2023-10-29: qty 14, 7d supply, fill #0

## 2023-10-29 MED ORDER — CIPROFLOXACIN HCL 500 MG PO TABS
500.0000 mg | ORAL_TABLET | Freq: Two times a day (BID) | ORAL | 0 refills | Status: AC
  Filled 2023-10-29: qty 14, 7d supply, fill #0

## 2023-10-29 MED ORDER — ENSURE PLUS HIGH PROTEIN PO LIQD
237.0000 mL | Freq: Two times a day (BID) | ORAL | Status: DC
  Administered 2023-10-29: 237 mL via ORAL

## 2023-10-29 MED ORDER — VANCOMYCIN HCL 125 MG PO CAPS
125.0000 mg | ORAL_CAPSULE | Freq: Two times a day (BID) | ORAL | 0 refills | Status: AC
  Filled 2023-10-29: qty 20, 10d supply, fill #0

## 2023-10-29 MED ORDER — ENSURE PLUS HIGH PROTEIN PO LIQD
237.0000 mL | Freq: Two times a day (BID) | ORAL | 0 refills | Status: AC
  Filled 2023-10-29: qty 14220, 30d supply, fill #0

## 2023-10-29 NOTE — Progress Notes (Signed)
 Date of Admission:  10/26/2023      ID: KAZANDRA FORSTROM is a 78 y.o. female  Principal Problem:   Cellulitis of head or scalp Active Problems:   CKD stage 3b, GFR 30-44 ml/min (HCC)   Essential hypertension   Hyperlipidemia   C. 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 female with a history of hyperlipidemia, bilateral knee replacement, chronic diarrhea, C. difficile SCC underwent MOH is admitted to the hospital for infected scalp wound with surrounding cellulitis.   Patient underwent Mohs surgery for squamous cell carcinoma of the scalp on 09/12/2023.  She has been having follow-up with her Mohs surgeon.  The scalp wound was healing very slowly with very minimal granulation tissue.  The plan was to do a graft and she was referred to the plastic surgeon.  This week when she was evaluated in the Derm clinic she was found to have some cellulitis around the wound as well as tenderness and hence was referred to the ED for admission.  The patient's history is complicated by chronic diarrhea since July 2025.  she has taken a few courses of antibiotics.       A 10-day course of cefdinir in May 2025.  A 10-day course of cefuroxime July 18, 2023.  3-day supply of amoxicillin .given August 21, 2023. She tested positive for C. difficile on 07/26/2023 for the first time and was given 14-day supply of vancomycin.  Symptoms responded to the treatment only for the diarrhea to recur again.  She tested for C. difficile PCR  again on September 05, 2023 and given a second course of 14-day vancomycin. She underwent a Mohs surgery 09/12/2023 She was hospitalized to [facility] ED between 10/06/2023 until 10/08/2023 for dizziness and weakness and found to have hyponatremia, AKI with a creatinine of 2.30.  Her stool test showed a C. difficile antigen positive but toxin was negative and the PCR was positive.  She was given a course of fidaxomicin  taper which she just completed.  Her diarrhea has resolved. She does not have any fever or chills    Subjective: Pt is doing well Seen by Surgeon and the scalp wound is much better  Medications:   allopurinol   300 mg Oral Daily   cyanocobalamin   1,000 mcg Oral Daily   feeding supplement  237 mL Oral BID BM   heparin   5,000 Units Subcutaneous Q8H   levothyroxine   50 mcg Oral QAC breakfast   metoprolol succinate  25 mg Oral Daily   simvastatin   20 mg Oral QHS   sodium chloride  flush  3 mL Intravenous Q12H   sodium chloride  flush  3 mL Intravenous Q12H   traMADol   50 mg Oral BID   traZODone  25 mg Oral QHS   vancomycin  125 mg Oral BID    Objective: Vital signs in last 24 hours: Patient Vitals for the past 24 hrs:  BP Temp Pulse Resp SpO2 Height Weight  10/29/23 0900 -- -- -- -- -- 5' (1.524 m) 59.2 kg  10/29/23 0613 (!) 122/57 98.9 F (37.2 C) 80 17 97 % -- --  10/28/23 2136 123/67 99 F (37.2 C) 77 15 99 % -- --  10/28/23 1404 105/60 98.9 F (37.2 C) 84 18 98 % -- --     Lines and Device Date on insertion # of days DC  Chiropractor  Foley     ETT       PHYSICAL EXAM:  General: Alert, cooperative, no distress, appears stated age.  Head:dressing not removed 10/29/23   10/27/23   Lungs: Clear to auscultation bilaterally. No Wheezing or Rhonchi. No rales. Heart: Regular rate and rhythm, no murmur, rub or gallop. Abdomen: Soft, non-tender,not distended. Bowel sounds normal. No masses Extremities: atraumatic, no cyanosis. No edema. No clubbing Skin: No rashes or lesions. Or bruising Lymph: Cervical, supraclavicular normal. Neurologic: Grossly non-focal  Lab Results    Latest Ref Rng & Units 10/28/2023    5:41 AM 10/27/2023   12:56 AM 10/26/2023   10:14 PM  CBC  WBC 4.0 - 10.5 K/uL 5.7  8.1  7.8   Hemoglobin 12.0 - 15.0 g/dL 88.4  87.3  87.3   Hematocrit 36.0 - 46.0 % 35.8  37.6  39.5   Platelets 150 - 400 K/uL 301  326  360         Latest Ref Rng & Units 10/28/2023    5:41 AM 10/27/2023    5:00 AM 10/26/2023   10:14 PM  CMP  Glucose 70 - 99 mg/dL 97  92  864   BUN 8 - 23 mg/dL 14  19  16    Creatinine 0.44 - 1.00 mg/dL 9.04  8.87  8.83   Sodium 135 - 145 mmol/L 132  134  135   Potassium 3.5 - 5.1 mmol/L 3.5  3.6  3.9   Chloride 98 - 111 mmol/L 95  95  94   CO2 22 - 32 mmol/L 27  27  29    Calcium 8.9 - 10.3 mg/dL 89.5  9.9  89.4   Total Protein 6.5 - 8.1 g/dL  5.4    Total Bilirubin 0.0 - 1.2 mg/dL  0.4    Alkaline Phos 38 - 126 U/L  69    AST 15 - 41 U/L  25    ALT 0 - 44 U/L  15        Microbiology: Scalp wound culture pending Called lab they see staph and gram neg    Assessment/Plan: Infected surgical wound of the scalp Squamous cell carcinoma of the scalp status post Mohs surgery on 09/12/2023 Cultures have been sent Still pending Spoke to the lab There could be Staph aureus as well as Pseudomonas but they are not sure at this moment Patient is currently on daptomycin and cefepime day 3 She is doing much better and I reviewed the pictures which the plastic surgeon took today Patient will go on linezolid 600 mg p.o. twice daily for 7 days and ciprofloxacin 500 mg p.o. twice daily for 7 days that would match the IV antibiotics she was getting here to cover MRSA as well as Pseudomonas. Because of recent C. difficile infection will continue with empiric vancomycin 125 mg p.o. twice daily for 10 days I will follow her in my clinic next week Appointment given from in [place] [facility] Side effects of linezolid and cipro explained Printed information given for linezolid   History of recurrent C. difficile Patient just completed a full course of fidaxomicin She is currently on vancomycin 125 mg p.o. twice daily as prophylactic treatment because of antibiotics  Hypothyroidism on Synthroid   CKD Creatinine is less than 1   I will follow her as OP Discussed the management in detail with patient and her  daughter

## 2023-10-29 NOTE — Progress Notes (Addendum)
 Meds delivered from outpt pharmacy to pt.  Provided pt and dtr with wound care education. All questions answered and additional supplies provided. Pt and daughter expressed understanding with discharge instructions. Pt taken via WC to main entrance to meet ride.

## 2023-10-29 NOTE — Progress Notes (Addendum)
 Subjective: 78 yo F with a chronic wound at the vertex of the scalp status post Mohs surgery for squamous cell carcinoma, currently infected (Cellulitis).  Patient afebrile and non-toxic. Wound has improved substantially.   Objective: Vital signs in last 24 hours: Temp:  [98.9 F (37.2 C)-99 F (37.2 C)] 98.9 F (37.2 C) (10/26 9386) Pulse Rate:  [77-84] 80 (10/26 0613) Resp:  [15-18] 17 (10/26 9386) BP: (105-123)/(57-67) 122/57 (10/26 0613) SpO2:  [97 %-99 %] 97 % (10/26 9386) Weight:  [59.2 kg] 59.2 kg (10/26 0900) Last BM Date : 10/29/23  Intake/Output from previous day: 10/25 0701 - 10/26 0700 In: 1573 [P.O.:1320; I.V.:3; IV Piggyback:250] Out: -  Intake/Output this shift: Total I/O In: 1380 [P.O.:480; IV Piggyback:900] Out: -   General appearance: alert, cooperative, and no distress Scalp Wound size 3.0 x 3.5 cm, erythematous edges improving with local wound care.     Lab Results:  CBC    Component Value Date/Time   WBC 5.7 10/28/2023 0541   RBC 3.84 (L) 10/28/2023 0541   HGB 11.5 (L) 10/28/2023 0541   HGB 11.6 (L) 10/02/2013 0505   HCT 35.8 (L) 10/28/2023 0541   HCT 43.1 09/18/2013 0919   PLT 301 10/28/2023 0541   PLT 212 10/02/2013 0505   MCV 93.2 10/28/2023 0541   MCV 96 09/18/2013 0919   MCH 29.9 10/28/2023 0541   MCHC 32.1 10/28/2023 0541   RDW 15.9 (H) 10/28/2023 0541   RDW 16.0 (H) 09/18/2013 0919   LYMPHSABS 1.1 10/28/2023 0541   MONOABS 0.6 10/28/2023 0541   EOSABS 0.5 10/28/2023 0541   BASOSABS 0.1 10/28/2023 0541    BMET Recent Labs    10/27/23 0500 10/28/23 0541  NA 134* 132*  K 3.6 3.5  CL 95* 95*  CO2 27 27  GLUCOSE 92 97  BUN 19 14  CREATININE 1.12* 0.95  CALCIUM 9.9 10.4*    Studies/Results: No results found.   Anti-infectives: Anti-infectives (From admission, onward)    Start     Dose/Rate Route Frequency Ordered Stop   10/29/23 0000  linezolid (ZYVOX) 600 MG tablet        600 mg Oral 2 times daily 10/29/23  1233 11/05/23 2359   10/29/23 0000  ciprofloxacin (CIPRO) 500 MG tablet        500 mg Oral 2 times daily 10/29/23 1233 11/05/23 2359   10/29/23 0000  vancomycin (VANCOCIN) 125 MG capsule        125 mg Oral 2 times daily 10/29/23 1233 11/08/23 2359   10/28/23 1500  vancomycin (VANCOCIN) IVPB 1000 mg/200 mL premix  Status:  Discontinued        1,000 mg 200 mL/hr over 60 Minutes Intravenous Every 36 hours 10/27/23 0002 10/27/23 1128   10/28/23 1400  DAPTOmycin (CUBICIN) IVPB 500 mg/66mL premix        8 mg/kg  59.2 kg 100 mL/hr over 30 Minutes Intravenous Daily 10/27/23 1128     10/27/23 2200  vancomycin (VANCOCIN) capsule 125 mg        125 mg Oral 2 times daily 10/27/23 1826     10/27/23 2000  ceFEPIme (MAXIPIME) 1 g in sodium chloride  0.9 % 100 mL IVPB        1 g 200 mL/hr over 30 Minutes Intravenous Every 12 hours 10/27/23 1815     10/27/23 1000  fidaxomicin (DIFICID) tablet 200 mg  Status:  Discontinued        200 mg Oral Every other day 10/26/23  2330 10/27/23 0013   10/26/23 2345  cefTRIAXone (ROCEPHIN) 2 g in sodium chloride  0.9 % 100 mL IVPB  Status:  Discontinued        2 g 200 mL/hr over 30 Minutes Intravenous Every 24 hours 10/26/23 2332 10/26/23 2332   10/26/23 2315  vancomycin (VANCOCIN) IVPB 1000 mg/200 mL premix        1,000 mg 200 mL/hr over 60 Minutes Intravenous  Once 10/26/23 2314 10/27/23 0354       Assessment/Plan: LOS: 2 days   Cellulitis of scalp - Wet to dry dressings with Vashe TID. If patient is discharged, BID at home. Instructed family on how to performed dressing changes. - Antibiotics per ID. Once infection resolves we will discuss reconstructive options with patient and family.  - Appreciate ID recommendations - Rest of care per Internal Medicine - Patient questions answered to patients satisfaction - If patient is discharged today follow up with me in the office this week. - If any questions after hours please contact plastic surgeon on call.    Taylour Lietzke M Saivion Goettel 10/29/2023

## 2023-10-29 NOTE — Plan of Care (Signed)
 ?  Problem: Clinical Measurements: ?Goal: Ability to maintain clinical measurements within normal limits will improve ?Outcome: Progressing ?Goal: Will remain free from infection ?Outcome: Progressing ?Goal: Diagnostic test results will improve ?Outcome: Progressing ?  ?

## 2023-10-29 NOTE — Discharge Summary (Signed)
 Physician Discharge Summary   Patient: Sheryl Kennedy MRN: [id number] DOB: [birth year]  Admit date:     10/26/2023  Discharge date: 10/29/23  Discharge Physician: Delon Herald   PCP: Auston Reyes [name], MD   Recommendations at discharge:   Continue antibiotics - for now, Ciprofloxacin and Linezolid twice daily for 1 week Take oral vancomycin twice daily for 10 days Add probiotic Follow up with Dr. Montorfano (Plastic Surgery) this week Follow up with Dr. Fayette (Infectious Disease) Follow up with Dr. Auston in 1-2 weeks  Discharge Diagnoses: Principal Problem:   Cellulitis of head or scalp Active Problems:   CKD stage 3b, GFR 30-44 ml/min (HCC)   C. 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  She was started on IV Daptomycin and Cefepime per ID with PO Vancomycin for empiric prevention of recurrent C diff.    Plastic surgery did bedside I&D and she is improving.  Assessment and Plan:  Scalp cellulitis s/p Mohs of SCC of the scalp Slow healing wound following Mohs on 9/9, healing by second intention s/p string suture with bolster packing Followed by dermatology (primarily) and plastic surgeon (for graft placement pending insurance approval)   Sent for admission by dermatology clinic on 10/24 for IV antibiotics Blood cultures NTD CT head with mildly increased soft tissue thickening and ulceration at the vertex, which is nonspecific but could represent progressive malignancy, infection, and/or scar tissue. No discrete/drainable fluid collection or significant skull erosion. ID  consulted Treating with IV Daptomycin and Cefepime per ID - Linezolid and Cipro x 7 days Plastic surgery consulted and removed the bolster, debrided at bedside with marked improvement Wet to dry dressing with Vashe TID Can discuss reconstructive options once the infection resolves F/u with plastic surgery later this week   History of recurrent C. difficile colitis Diagnosed with 3rd episode on 10/4 Completed 3 week course of fidaxomicin on 10/20 Diarrhea has been improving with none in 2-3 weeks Patient reported 1 episode of loose stool on 10/24, none since Enteric precautions discontinued per ID ID has ordered empiric PO Vanc and wants this continued while taking abx   CKD stage IIIb Appears to be stable at this time Attempt to avoid nephrotoxic medications Recheck BMP in AM    Chronic hyponatremia Dating back to 2015 Not clinically significant    Essential hypertension Spironolactone and hydrochlorothiazide on hold Continue Toprol-XL resume torsemide   Hyperlipidemia Continue Zocor    Hypothyroidism Continue levothyroxine    Insomnia Continue trazodone   Gout Continue allopurinol    Chronic pain I have reviewed this patient in the [place] Controlled Substances Reporting System.  She is receiving medications from only one provider and appears to be taking them as prescribed. She is not at particularly high risk of opioid misuse, diversion, or overdose.  Continue tramadol            Consultants: ID Plastic surgery   Procedures: None   Antibiotics: Vancomycin 10/23-  Pain control - [place]  Controlled Substance Reporting System database was reviewed. and patient was instructed, not to drive, operate heavy machinery, perform activities at heights, swimming or participation in water activities or  provide baby-sitting services while on Pain, Sleep and Anxiety Medications; until their outpatient Physician has advised to do so again. Also recommended to not to take  more than prescribed Pain, Sleep and Anxiety Medications.   Disposition: Home Diet recommendation:  Regular diet DISCHARGE MEDICATION: Allergies as of 10/29/2023       Reactions   Ery-tab [erythromycin] Other (See Comments)   Unknown reaction   Fosamax [alendronate] Other (See Comments)   Possible cause of hypercalcemia   Levaquin [levofloxacin] Other (See Comments)    Dizziness    Aleve [naproxen] Swelling, Other (See Comments)   Arthralgias  Swelling in hands/joints, also   Shellfish Allergy Swelling, Dermatitis, Other (See Comments)   Arthralgias Joint swelling, also Gout flares   Sudafed [pseudoephedrine] Rash, Other (See Comments)   Rhinitis Sneezing   Sulfa Antibiotics Rash        Medication List     PAUSE taking these medications    spironolactone-hydrochlorothiazide 25-25 MG tablet Wait to take this until your doctor or other care provider tells you to start again. Commonly known as: ALDACTAZIDE Take 1 tablet by mouth daily.       STOP taking these medications    Dificid 200 MG Tabs tablet Generic drug: fidaxomicin   magnesium oxide 400 (240 Mg) MG tablet Commonly known as: MAG-OX       TAKE these medications    acetaminophen  500 MG tablet Commonly known as: TYLENOL  Take 1,000 mg by mouth 2 (two) times daily as needed for fever or headache (pain).   allopurinol  300 MG tablet Commonly known as: ZYLOPRIM  Take 300 mg by mouth daily.   aspirin  EC 81 MG tablet Take 81 mg by mouth daily.   Centrum Silver Women 50+ Tabs Take 1 tablet by mouth daily.   ciprofloxacin 500 MG tablet Commonly known as: Cipro Take 1 tablet (500 mg total) by mouth 2 (two) times daily for 7 days.   feeding supplement Liqd Take 237 mLs by mouth 2 (two) times daily between meals.   fexofenadine 180 MG tablet Commonly known as: ALLEGRA Take 80 mg by mouth at bedtime.   ketoconazole  2 % shampoo Commonly known as: NIZORAL  WASH SCALP 2-3 TIMES WEEKLY, LET SIT 5  MINUTES AND RINSE What changed: See the new instructions.   levothyroxine  50 MCG tablet Commonly known as: SYNTHROID  Take 50 mcg by mouth daily before breakfast.   linezolid 600 MG tablet Commonly known as: ZYVOX Take 1 tablet (600 mg total) by mouth 2 (two) times daily for 7 days.   metoprolol succinate 25 MG 24 hr tablet Commonly known as: TOPROL-XL Take 25 mg by mouth daily.   potassium chloride  SA 20 MEQ tablet Commonly known as: KLOR-CON  M Take 20 mEq by mouth 3 (three) times daily.   saccharomyces boulardii 250 MG capsule Commonly known as: FLORASTOR Take 1 capsule (250 mg total) by mouth 2 (two) times daily.   simvastatin  20 MG tablet Commonly known as: ZOCOR  Take 20 mg by mouth at bedtime.   sodium chloride  0.65 % Soln nasal spray Commonly known as: OCEAN Place 1 spray into both nostrils 2 (two) times daily as needed (sinus irritation).   torsemide 20 MG tablet Commonly known as: DEMADEX Take 20 mg by mouth daily.   traMADol  50 MG tablet Commonly known as: ULTRAM  Take 50 mg by mouth 2 (two) times daily.   traZODone 50 MG tablet Commonly known as: DESYREL Take 25 mg by mouth at bedtime.   vancomycin 125 MG capsule  Commonly known as: VANCOCIN Take 1 capsule (125 mg total) by mouth 2 (two) times daily for 10 days.   VITAMIN B-12 PO Take 1 tablet by mouth daily.   VITAMIN D-3 PO Take 1 capsule by mouth daily.               Discharge Care Instructions  (From admission, onward)           Start     Ordered   10/29/23 0000  Discharge wound care:       Comments: Wet to dry dressing with Vashe three times daily   10/29/23 1233            Follow-up Information     Auston Reyes BIRCH, MD. Schedule an appointment as soon as possible for a visit in 1 week(s).   Specialty: Internal Medicine Contact information: [facility] [address] [place] [address] [phone]         Fayette Bodily, MD. Schedule an  appointment as soon as possible for a visit.   Specialty: Infectious Diseases Contact information: [address] [place] [address] [phone]         Montorfano, Lisandro M, MD. Schedule an appointment as soon as possible for a visit in 1 week(s).   Specialty: Plastic Surgery Contact information: [address] [place] [address] [phone]                Discharge Exam:   Subjective: Looks well, feels much better, would love to go home if possible.   Objective: Vitals:   10/28/23 2136 10/29/23 0613  BP: 123/67 (!) 122/57  Pulse: 77 80  Resp: 15 17  Temp: 99 F (37.2 C) 98.9 F (37.2 C)  SpO2: 99% 97%    Intake/Output Summary (Last 24 hours) at 10/29/2023 1235 Last data filed at 10/29/2023 1000 Gross per 24 hour  Intake 2893 ml  Output --  Net 2893 ml   Filed Weights   10/29/23 0900  Weight: 59.2 kg    Exam:  General:  Appears calm and comfortable and is in NAD, standing at sink cleaning up Eyes:  normal lids, iris ENT:  grossly normal hearing, lips & tongue, mmm Cardiovascular:  RRR. No LE edema.  Respiratory:   CTA bilaterally with no wheezes/rales/rhonchi.  Normal respiratory effort. Abdomen:  soft, NT, ND Skin:  large vertex head wound as below, clearly improving  Musculoskeletal:  grossly normal tone BUE/BLE, good ROM, no bony abnormality Psychiatric:  grossly normal mood and affect, speech fluent and appropriate, AOx3 Neurologic:  CN 2-12 grossly intact, moves all extremities in coordinated fashion  Data Reviewed: I have reviewed the patient's lab results since admission.  Pertinent labs for today include:   Na++ 132, stable CK 28 WBC 5.7 Hgb 11.5 Wound culture pending, GPC in pairs     Condition at discharge: improving  The results of significant diagnostics from this hospitalization (including imaging, microbiology, ancillary and laboratory) are listed below for reference.   Imaging  Studies: CT Head W or Wo Contrast Result Date: 10/27/2023 EXAM: CT HEAD WITHOUT AND WITH CONTRAST 10/27/2023 12:02:58 AM TECHNIQUE: CT of the head was performed without and with the administration of intravenous contrast (60mL iohexol  (OMNIPAQUE ) 300 MG/ML solution). Automated exposure control, iterative reconstruction, and/or weight based adjustment of the mA/kV was utilized to reduce the radiation dose to as low as reasonably achievable. COMPARISON: CT head 10/06/2023 CLINICAL HISTORY: Skin cancer, assess treatment response; moh's  procedure on scalp, concern of infeciton. FINDINGS: BRAIN AND VENTRICLES: No acute intracranial hemorrhage. No mass effect or midline shift. No extra-axial fluid collection. No evidence of acute infarct. No hydrocephalus. No abnormal enhancement. ORBITS: No acute abnormality. SINUSES AND MASTOIDS: No acute abnormality. SOFT TISSUES AND SKULL: Mildly increased soft tissue thickening and ulceration at the vertex. No discrete/drainable fluid collection or significant skull erosion.c IMPRESSION: 1. Mildly increased soft tissue thickening and ulceration at the vertex, which is nonspecific but could represent progressive malignancy, infection, and/or scar tissue. No discrete/drainable fluid collection or significant skull erosion. 2. No evidence of acute intracranial abnormality. Electronically signed by: Gilmore Molt MD 10/27/2023 12:24 AM EDT RP Workstation: [id number]   DG HIP UNILAT WITH PELVIS 2-3 VIEWS RIGHT Result Date: 10/06/2023 CLINICAL DATA:  Right hip pain.  Fall. EXAM: DG HIP (WITH OR WITHOUT PELVIS) 2-3V RIGHT COMPARISON:  None Available. FINDINGS: No acute fracture of the pelvis or right hip. No hip dislocation. Pubic rami are intact. No pubic symphyseal or sacroiliac diastasis. The bones are subjectively under mineralized. Lower lumbar degenerative change. Scattered phleboliths in the pelvis. IMPRESSION: No acute fracture or dislocation of the pelvis or right hip.  Electronically Signed   By: Andrea Gasman M.D.   On: 10/06/2023 17:50   DG Chest Port 1 View Result Date: 10/06/2023 CLINICAL DATA:  Leukocytosis EXAM: PORTABLE CHEST 1 VIEW COMPARISON:  Chest CT 06/16/2020 FINDINGS: The heart size and mediastinal contours are within normal limits. Both lungs are clear. The visualized skeletal structures are unremarkable. Aortic atherosclerosis. IMPRESSION: No active disease. Electronically Signed   By: Luke Bun M.D.   On: 10/06/2023 16:35   CT HEAD WO CONTRAST Result Date: 10/06/2023 CLINICAL DATA:  Provided history: Ataxia, acute, traumatic (Ped 0-17y) Dizziness leading to fall last night. EXAM: CT HEAD WITHOUT CONTRAST TECHNIQUE: Contiguous axial images were obtained from the base of the skull through the vertex without intravenous contrast. RADIATION DOSE REDUCTION: This exam was performed according to the departmental dose-optimization program which includes automated exposure control, adjustment of the mA and/or kV according to patient size and/or use of iterative reconstruction technique. COMPARISON:  Head CT 09/11/2018 FINDINGS: Brain: No intracranial hemorrhage, mass effect, or midline shift. Brain volume is normal for age. No hydrocephalus. The basilar cisterns are patent. Mild periventricular chronic small vessel ischemia, normal for age. No evidence of territorial infarct or acute ischemia. No extra-axial or intracranial fluid collection. Vascular: Atherosclerosis of skullbase vasculature without hyperdense vessel or abnormal calcification. Skull: No fracture or focal lesion. Sinuses/Orbits: Paranasal sinuses and mastoid air cells are clear. The visualized orbits are unremarkable. Other: Frontal scalp laceration. IMPRESSION: Frontal scalp laceration. No acute intracranial abnormality. No skull fracture. Electronically Signed   By: Andrea Gasman M.D.   On: 10/06/2023 16:00   CT Cervical Spine Wo Contrast Result Date: 10/06/2023 CLINICAL DATA:  Provided  history: Ataxia, cervical trauma Dizziness leading to fall last night. EXAM: CT CERVICAL SPINE WITHOUT CONTRAST TECHNIQUE: Multidetector CT imaging of the cervical spine was performed without intravenous contrast. Multiplanar CT image reconstructions were also generated. RADIATION DOSE REDUCTION: This exam was performed according to the departmental dose-optimization program which includes automated exposure control, adjustment of the mA and/or kV according to patient size and/or use of iterative reconstruction technique. COMPARISON:  Cervical spine MRI 08/23/2021 FINDINGS: Alignment: Straightening of normal lordosis. No traumatic subluxation. Chronic grade 1 anterolisthesis of C3 on C4. Skull base and vertebrae: No acute fracture. Vertebral body heights are maintained. The dens and skull base  are intact. Degenerative pannus at C1-C2. Soft tissues and spinal canal: No prevertebral fluid or swelling. No visible canal hematoma. Disc levels: Moderate to advanced multilevel degenerative disc disease and facet hypertrophy. Upper chest: No acute findings. Other: None. IMPRESSION: 1. No acute fracture or traumatic subluxation of the cervical spine. 2. Moderate-advanced multilevel degenerative disc disease and facet hypertrophy. Electronically Signed   By: Andrea Gasman M.D.   On: 10/06/2023 15:56    Microbiology: Results for orders placed or performed during the hospital encounter of 10/26/23  Culture, blood (Routine X 2) w Reflex to ID Panel     Status: None (Preliminary result)   Collection Time: 10/27/23 12:06 AM   Specimen: BLOOD  Result Value Ref Range Status   Specimen Description   Final    BLOOD RIGHT ANTECUBITAL Performed at [facility], [address] [place], [place] [address]    Special Requests   Final    Blood Culture adequate volume BOTTLES DRAWN AEROBIC AND ANAEROBIC Performed at [facility], [address] [place], [place] [address]    Culture    Final    NO GROWTH 2 DAYS Performed at [facility], [address] [place], [place] [address]    Report Status PENDING  Incomplete  Culture, blood (Routine X 2) w Reflex to ID Panel     Status: None (Preliminary result)   Collection Time: 10/27/23 12:56 AM   Specimen: BLOOD  Result Value Ref Range Status   Specimen Description BLOOD SITE NOT SPECIFIED  Final   Special Requests   Final    BOTTLES DRAWN AEROBIC ONLY Blood Culture adequate volume   Culture   Final    NO GROWTH 2 DAYS Performed at [facility], [address] [place], [place] [address]    Report Status PENDING  Incomplete  Aerobic Culture w Gram Stain (superficial specimen)     Status: None (Preliminary result)   Collection Time: 10/27/23 11:37 AM   Specimen: SCALP; Wound  Result Value Ref Range Status   Specimen Description   Final    SCALP Performed at [facility], [address], [place], [place] [address]    Special Requests   Final    WOUND Performed at [facility], [address] [place], [place] [address]    Gram Stain NO WBC SEEN RARE GRAM POSITIVE COCCI IN PAIRS   Final   Culture   Final    CULTURE REINCUBATED FOR BETTER GROWTH Performed at [facility], [address], [place], [place] [address]    Report Status PENDING  Incomplete    Labs: CBC: Recent Labs  Lab 10/26/23 2214 10/27/23 0056 10/28/23 0541  WBC 7.8 8.1 5.7  NEUTROABS 4.8  --  3.3  HGB 12.6 12.6 11.5*  HCT 39.5 37.6 35.8*  MCV 95.2 94.7 93.2  PLT 360 326 301   Basic Metabolic Panel: Recent Labs  Lab 10/26/23 2214 10/27/23 0500 10/28/23 0541  NA 135 134* 132*  K 3.9 3.6 3.5  CL 94* 95* 95*  CO2 29 27 27   GLUCOSE 135* 92 97  BUN 16 19 14   CREATININE 1.16* 1.12* 0.95  CALCIUM 10.5* 9.9 10.4*   Liver Function Tests: Recent Labs  Lab 10/27/23 0500  AST 25  ALT 15  ALKPHOS 69  BILITOT 0.4  PROT 5.4*  ALBUMIN 3.4*   CBG: No results for input(s):  GLUCAP in the last 168 hours.  Discharge time spent: greater than 30 minutes.  Signed: Delon Herald, MD [facility] 10/29/2023

## 2023-10-30 ENCOUNTER — Other Ambulatory Visit (HOSPITAL_COMMUNITY): Payer: Self-pay

## 2023-10-30 LAB — AEROBIC CULTURE W GRAM STAIN (SUPERFICIAL SPECIMEN): Gram Stain: NONE SEEN

## 2023-10-31 ENCOUNTER — Inpatient Hospital Stay: Admitting: Infectious Diseases

## 2023-10-31 ENCOUNTER — Ambulatory Visit

## 2023-10-31 VITALS — BP 143/78 | HR 83 | Ht 60.0 in | Wt 131.6 lb

## 2023-10-31 DIAGNOSIS — Z85828 Personal history of other malignant neoplasm of skin: Secondary | ICD-10-CM

## 2023-10-31 DIAGNOSIS — S0100XD Unspecified open wound of scalp, subsequent encounter: Secondary | ICD-10-CM | POA: Diagnosis not present

## 2023-10-31 DIAGNOSIS — Z09 Encounter for follow-up examination after completed treatment for conditions other than malignant neoplasm: Secondary | ICD-10-CM

## 2023-10-31 NOTE — Addendum Note (Signed)
 Addended by: EUSTACIO POUR on: 10/31/2023 04:48 PM   Modules accepted: Orders

## 2023-10-31 NOTE — Progress Notes (Addendum)
 Established Patient Office Visit  Subjective   Patient ID: Sheryl Kennedy, female    DOB: February 21, 1945  Age: 78 y.o. MRN: [id number]  Chief Complaint  Patient presents with   Follow-up    HPI  Sheryl Kennedy is a 78 y.o. year old female who presents after Mohs micrographic surgery for a squamous cell carcinoma (SCC) located on the right crown of the scalp, treated on 09/12/23, with secondary intention healing. Very minimal granulation tissue noted despite multiple attempts to promote healing, including amnion allograft placement at one of the visits with Dr. Corey. Likely delayed wound healing multifactorial.  Patient is a non-smoker.  Patient was referred to me by Dr. Paci for management of chronic scalp wound.  The patient was admitted over the weekend due to a scalp infection at the site of previous Mohs bolster.  I debrided the wound at the bedside and we started Vashe wet-to-dry dressings twice daily.  Patient is on antibiotics prescribed by infectious disease.  Patient is doing much better after being discharged from the hospital.  Patient comes today accompanied by son and husband to discuss reconstructive options. I introduced myself as Dr.Jazell Rosenau M Diane Mochizuki.     Objective:     BP (!) 143/78   Pulse 83   Ht 5' (1.524 m)   Wt 131 lb 9.6 oz (59.7 kg)   SpO2 98%   BMI 25.70 kg/m  BP Readings from Last 3 Encounters:  10/31/23 (!) 143/78  10/29/23 (!) 122/57  10/24/23 (!) 153/72      General appearance: alert, cooperative, and no distress Scalp Wound size 3.0 x 3.5 cm, erythematous edges improving with local wound care.  No results found for any visits on 10/31/23.  Last CBC Lab Results  Component Value Date   WBC 5.7 10/28/2023   HGB 11.5 (L) 10/28/2023   HCT 35.8 (L) 10/28/2023   MCV 93.2 10/28/2023   MCH 29.9 10/28/2023   RDW 15.9 (H) 10/28/2023   PLT 301 10/28/2023      The ASCVD Risk score (Arnett DK, et al., 2019) failed to calculate for the following  reasons:   Risk score cannot be calculated because patient has a medical history suggesting prior/existing ASCVD    Assessment & Plan:   Problem List Items Addressed This Visit       Other   S/P Mohs surgery for basal cell carcinoma   Other Visit Diagnoses       Follow-up exam    -  Primary       Pt. presents with a chronic wound at the vertex of the scalp status post Mohs surgery for squamous cell carcinoma.  Patient had an infection at surgical site that was treated with IV antibiotics over the weekend.  We had a long discussion with patient and family, I do not think that skin substitute is a suitable idea at this point due to previous history of infection.  I recommended local tissue rearrangement/flap. Alternatives include continued observation; however, given the delayed progression of secondary intention healing and multiple debridements, I do not believe the wounds will heal appropriately without further intervention. The benefits of proceeding with local tissue rearrangement include improved healing potential and the likelihood of complete wound closure. The risks discussed include seroma, hematoma, infection, bleeding, alopecia, injury to surrounding healthy tissue and structures, failure of the flaps, need for additional surgery, wound separation, damage to surrounding structures and recurrence of the wounds. Scar patterns and the expected postoperative course were  also reviewed in detail with the patient. Risks of anesthesia were addressed briefly and will be discussed further by the anesthesia team.  The patient expressed good understanding of the risks, benefits, and postoperative care, and all questions were answered. The plan is to do a few more weeks of local wound care and then proceed with operative closure of the wound in the operating room. Precertification and scheduling will be initiated following primary care clearance. The planned operative procedure includes local  tissue rearrangement/flap. The patient and her daughter understand the risks and benefits of this operation.  Patient willing to stay for a few days in the hospital.  Patient will need PCP clearance.  I spent 45 minutes addressing this visit.   Mirella Gueye M Shantice Menger, MD

## 2023-10-31 NOTE — Addendum Note (Signed)
 Addended by: EUSTACIO POUR on: 10/31/2023 04:49 PM   Modules accepted: Orders

## 2023-11-01 LAB — CULTURE, BLOOD (ROUTINE X 2)
Culture: NO GROWTH
Culture: NO GROWTH
Special Requests: ADEQUATE
Special Requests: ADEQUATE

## 2023-11-02 ENCOUNTER — Encounter: Payer: Self-pay | Admitting: Infectious Diseases

## 2023-11-02 ENCOUNTER — Other Ambulatory Visit: Payer: Self-pay

## 2023-11-02 ENCOUNTER — Ambulatory Visit: Admitting: Infectious Diseases

## 2023-11-02 ENCOUNTER — Other Ambulatory Visit
Admission: RE | Admit: 2023-11-02 | Discharge: 2023-11-02 | Disposition: A | Discharge status: Home / Self Care | Source: Ambulatory Visit | Attending: Infectious Diseases | Admitting: Infectious Diseases

## 2023-11-02 ENCOUNTER — Ambulatory Visit: Attending: Infectious Diseases | Admitting: Infectious Diseases

## 2023-11-02 VITALS — BP 120/65 | HR 77 | Temp 97.7°F

## 2023-11-02 DIAGNOSIS — L7682 Other postprocedural complications of skin and subcutaneous tissue: Secondary | ICD-10-CM | POA: Diagnosis not present

## 2023-11-02 DIAGNOSIS — T148XXA Other injury of unspecified body region, initial encounter: Secondary | ICD-10-CM | POA: Insufficient documentation

## 2023-11-02 DIAGNOSIS — B370 Candidal stomatitis: Secondary | ICD-10-CM | POA: Diagnosis not present

## 2023-11-02 DIAGNOSIS — B965 Pseudomonas (aeruginosa) (mallei) (pseudomallei) as the cause of diseases classified elsewhere: Secondary | ICD-10-CM

## 2023-11-02 DIAGNOSIS — B9689 Other specified bacterial agents as the cause of diseases classified elsewhere: Secondary | ICD-10-CM

## 2023-11-02 DIAGNOSIS — T8141XA Infection following a procedure, superficial incisional surgical site, initial encounter: Secondary | ICD-10-CM | POA: Diagnosis present

## 2023-11-02 DIAGNOSIS — L089 Local infection of the skin and subcutaneous tissue, unspecified: Secondary | ICD-10-CM | POA: Insufficient documentation

## 2023-11-02 DIAGNOSIS — B955 Unspecified streptococcus as the cause of diseases classified elsewhere: Secondary | ICD-10-CM | POA: Diagnosis not present

## 2023-11-02 DIAGNOSIS — C4442 Squamous cell carcinoma of skin of scalp and neck: Secondary | ICD-10-CM | POA: Diagnosis not present

## 2023-11-02 DIAGNOSIS — T8149XA Infection following a procedure, other surgical site, initial encounter: Secondary | ICD-10-CM | POA: Diagnosis not present

## 2023-11-02 LAB — COMPREHENSIVE METABOLIC PANEL WITH GFR
ALT: 20 U/L (ref 0–44)
AST: 35 U/L (ref 15–41)
Albumin: 3.8 g/dL (ref 3.5–5.0)
Alkaline Phosphatase: 66 U/L (ref 38–126)
Anion gap: 16 — ABNORMAL HIGH (ref 5–15)
BUN: 30 mg/dL — ABNORMAL HIGH (ref 8–23)
CO2: 22 mmol/L (ref 22–32)
Calcium: 10 mg/dL (ref 8.9–10.3)
Chloride: 97 mmol/L — ABNORMAL LOW (ref 98–111)
Creatinine, Ser: 1.35 mg/dL — ABNORMAL HIGH (ref 0.44–1.00)
GFR, Estimated: 40 mL/min — ABNORMAL LOW (ref >60)
Glucose, Bld: 108 mg/dL — ABNORMAL HIGH (ref 70–99)
Potassium: 4.9 mmol/L (ref 3.5–5.1)
Sodium: 135 mmol/L (ref 135–145)
Total Bilirubin: 0.6 mg/dL (ref 0.0–1.2)
Total Protein: 6.8 g/dL (ref 6.5–8.1)

## 2023-11-02 MED ORDER — NYSTATIN 100000 UNIT/ML MT SUSP
5.0000 mL | Freq: Four times a day (QID) | OROMUCOSAL | 0 refills | Status: DC
  Filled 2023-11-02: qty 100, 5d supply, fill #0

## 2023-11-02 NOTE — Patient Instructions (Signed)
 Today, we reviewed the progress of your scalp infection following surgery. You are currently on day seven of your antibiotic regimen and are using a topical solution for wound care. We also discussed your sore throat and swollen eyes, which you believe are due to dry weather and allergies. Additionally, we addressed your oral thrush and the need for increased protein intake to aid in healing.  YOUR PLAN:  -POSTOPERATIVE SCALP WOUND INFECTION: A postoperative scalp wound infection is an infection that occurs after surgery. Your infection, caused by Pseudomonas, Citrobacter, and Streptococcus bacteria, is healing well. Continue taking ciprofloxacin until Sunday to complete the ten-day course of antibiotics, and you can stop taking linezolid. We will perform blood tests to monitor your white blood cell count and liver function. Continue with the wet-to-dry dressing changes and increase your protein intake to help with wound healing.  -ORAL CANDIDIASIS: Oral candidiasis, also known as thrush, is a yeast infection in the mouth that can occur after extensive antibiotic use. You have been prescribed nystatin liquid to swish and swallow, 5 mL four times a day. Additionally, eating plain Greek yogurt and taking a Florastor probiotic can help restore the natural balance of bacteria in your mouth.  INSTRUCTIONS:  Please continue taking both antibiotics- Once I see the blood result I may ask you to stop linezolid . We will perform blood tests to monitor your white blood cell count and liver function. Continue with the wet-to-dry dressing changes and increase your protein intake. Use nystatin liquid for your oral thrush as prescribed, and consider adding plain Greek yogurt and a Florastor probiotic to your diet.

## 2023-11-02 NOTE — Progress Notes (Unsigned)
 NAME: Sheryl Kennedy  DOB: [birth year]  MRN: [id number]  Date/Time: 11/02/2023 12:27 PM  Subjective:  Pt is here with daughter and husband Agreed to the use of AI scribe ? Sheryl Kennedy is a 78 year old female who presents with a follow-up of a scalp infection post-surgery.  She was hospitalized from October 23 to October 29, 2023, for a scalp infection following  MOHs surgery for [facility] . During her hospital stay, she received intravenous antibiotics, including vancomycin and cefepime. Upon discharge, she was prescribed oral antibiotics, linezolid and ciprofloxacin, for a total of ten days. She had a mixed bacterial infection involving pseudomonas, citrobacter, and streptococcus, but no MRSA was detected.She is currently on day seven of this antibiotic regimen.  She uses a topical solution from a blue bottle provided by the hospital to clean the area. Her brother and father assist with her wound care at home, changing the dressing twice daily. The dressing is a wet-to-dry type, which she finds sometimes difficult to manage.  She had a mixed bacterial infection involving pseudomonas, citrobacter, and streptococcus, but no MRSA was detected. She is concerned about the possibility of needing further surgical intervention, including flap surgery, and mentions a low protein level that requires dietary supplementation with protein drinks to aid healing.  She reports a sore throat and swollen eyes, which she attributes to dry weather and allergies. She has been experiencing discomfort in her throat.  Her current medications include vancomycin for C. diff, taken twice daily, and she is nearing the end of her course of linezolid and ciprofloxacin.  Past Medical History:  Diagnosis Date   Arthritis    Cancer (HCC) 08/2012   skin right anterior lower leg, squamous cell   DSAP (disseminated superficial actinic porokeratosis)    Gout    Hx of seasonal allergies    Hyperlipidemia    Hypertension    SCC  (squamous cell carcinoma) 07/31/2023   superfical infiltration, R crown scalp, referral to Dr. 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 Left forearm. Excision 02/01/2023   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 complications     Past Surgical History:  Procedure Laterality Date   BACK SURGERY  2008   no metal in back   BREAST BIOPSY Right 09/13/2010   core/neg   BREAST BIOPSY Right 12/09/2015   us  biopsy/neg   BREAST SURGERY Right 2012   CATARACT EXTRACTION W/PHACO Left 04/25/2022   Procedure: CATARACT EXTRACTION PHACO AND INTRAOCULAR LENS PLACEMENT (IOC) LEFT 3.42 00:25.2;  Surgeon: Myrna Adine Anes, MD;  Location: [facility];  Service: Ophthalmology;  Laterality: Left;   CATARACT EXTRACTION W/PHACO Right 05/23/2022   Procedure: CATARACT EXTRACTION PHACO AND INTRAOCULAR LENS PLACEMENT (IOC) RIGHT 3.84 00:28.1;  Surgeon: Myrna Adine Anes, MD;  Location: [facility];  Service: Ophthalmology;  Laterality: Right;   CHONDROPLASTY Left 01/22/2016   Procedure: arthroscopic medial AND LATERAL CHONDROPLASTY;  Surgeon: Lynwood SHAUNNA Hue, MD;  Location: [facility] ORS;  Service: Orthopedics;  Laterality: Left;   COLONOSCOPY  2011   COLONOSCOPY WITH PROPOFOL  N/A 04/14/2015   Procedure: COLONOSCOPY WITH PROPOFOL ;  Surgeon: Gladis RAYMOND Mariner, MD;  Location: [facility];  Service: Endoscopy;  Laterality: N/A;   CYST REMOVAL HAND Left 2012   JOINT REPLACEMENT Right 09/30/2013  TOTAL KNEE REPLACEMENT, DR. MARDEE, [facility]   KNEE ARTHROSCOPY WITH MEDIAL MENISECTOMY Left 01/22/2016   Procedure: KNEE ARTHROSCOPY WITH MEDIAL AND LATERAL MENISECTOMY;  Surgeon: Lynwood SHAUNNA Mardee, MD;  Location: [facility] ORS;  Service: Orthopedics;  Laterality: Left;   KNEE SURGERY  1997   LEFT HEART CATH AND CORONARY ANGIOGRAPHY N/A 10/25/2021    Procedure: LEFT HEART CATH AND CORONARY ANGIOGRAPHY;  Surgeon: Mady Bruckner, MD;  Location: [facility] INVASIVE CV LAB;  Service: Cardiovascular;  Laterality: N/A;   Stab pheblectomy  Left 2013   TONSILLECTOMY     VEIN CLOSURE Bilateral 2012    Social History   Socioeconomic History   Marital status: Married    Spouse name: Not on file   Number of children: Not on file   Years of education: Not on file   Highest education level: Not on file  Occupational History   Not on file  Tobacco Use   Smoking status: Never   Smokeless tobacco: Never  Substance and Sexual Activity   Alcohol use: Yes    Comment: occassional   Drug use: No   Sexual activity: Not Currently  Other Topics Concern   Not on file  Social History Narrative   Not on file   Social Drivers of Health   Financial Resource Strain: Medium Risk (07/25/2023)   Received from [facility]   Overall Financial Resource Strain (CARDIA)    Difficulty of Paying Living Expenses: Somewhat hard  Food Insecurity: No Food Insecurity (10/27/2023)   Hunger Vital Sign    Worried About Running Out of Food in the Last Year: Never true    Ran Out of Food in the Last Year: Never true  Transportation Needs: No Transportation Needs (10/27/2023)   PRAPARE - Administrator, Civil Service (Medical): No    Lack of Transportation (Non-Medical): No  Physical Activity: Not on file  Stress: Not on file  Social Connections: Socially Integrated (10/27/2023)   Social Connection and Isolation Panel    Frequency of Communication with Friends and Family: More than three times a week    Frequency of Social Gatherings with Friends and Family: Three times a week    Attends Religious Services: More than 4 times per year    Active Member of Clubs or Organizations: Yes    Attends Banker Meetings: More than 4 times per year    Marital Status: Married  Catering Manager Violence: Not At Risk (10/27/2023)    Humiliation, Afraid, Rape, and Kick questionnaire    Fear of Current or Ex-Partner: No    Emotionally Abused: No    Physically Abused: No    Sexually Abused: No    Family History  Problem Relation Age of Onset   Heart failure Mother    Heart disease Mother    Arthritis Mother    Colon cancer Paternal Grandmother    Breast cancer Neg Hx    Allergies  Allergen Reactions   Ery-Tab [Erythromycin] Other (See Comments)    Unknown reaction   Fosamax [Alendronate] Other (See Comments)    Possible cause of hypercalcemia   Levaquin [Levofloxacin] Other (See Comments)     Dizziness    Aleve [Naproxen] Swelling and Other (See Comments)    Arthralgias  Swelling in hands/joints, also   Shellfish Allergy Swelling, Dermatitis and Other (See Comments)    Arthralgias Joint swelling, also Gout flares   Sudafed [Pseudoephedrine] Rash and Other (See Comments)    Rhinitis Sneezing  Sulfa Antibiotics Rash   I? Current Outpatient Medications  Medication Sig Dispense Refill   acetaminophen  (TYLENOL ) 500 MG tablet Take 1,000 mg by mouth 2 (two) times daily as needed for fever or headache (pain).     allopurinol  (ZYLOPRIM ) 300 MG tablet Take 300 mg by mouth daily.     aspirin  EC 81 MG tablet Take 81 mg by mouth daily.     Cholecalciferol (VITAMIN D-3 PO) Take 1 capsule by mouth daily.     ciprofloxacin (CIPRO) 500 MG tablet Take 1 tablet (500 mg total) by mouth 2 (two) times daily for 7 days. 14 tablet 0   Cyanocobalamin  (VITAMIN B-12 PO) Take 1 tablet by mouth daily.     feeding supplement (ENSURE PLUS HIGH PROTEIN) LIQD Take 237 mLs by mouth 2 (two) times daily between meals. 14220 mL 0   fexofenadine (ALLEGRA) 180 MG tablet Take 80 mg by mouth at bedtime.     ketoconazole  (NIZORAL ) 2 % shampoo WASH SCALP 2-3 TIMES WEEKLY, LET SIT 5 MINUTES AND RINSE (Patient taking differently: Apply 1 Application topically See admin instructions. Apply topically to scalp up to twice a week as needed for scalp  irritation.) 120 mL 3   levothyroxine  (SYNTHROID ) 50 MCG tablet Take 50 mcg by mouth daily before breakfast.     linezolid (ZYVOX) 600 MG tablet Take 1 tablet (600 mg total) by mouth 2 (two) times daily for 7 days. 14 tablet 0   metoprolol succinate (TOPROL-XL) 25 MG 24 hr tablet Take 25 mg by mouth daily.      Multiple Vitamins-Minerals (CENTRUM SILVER WOMEN 50+) TABS Take 1 tablet by mouth daily.     potassium chloride  SA (K-DUR,KLOR-CON ) 20 MEQ tablet Take 20 mEq by mouth 3 (three) times daily.      saccharomyces boulardii (FLORASTOR) 250 MG capsule Take 1 capsule (250 mg total) by mouth 2 (two) times daily. 60 capsule 0   simvastatin  (ZOCOR ) 20 MG tablet Take 20 mg by mouth at bedtime.     sodium chloride  (OCEAN) 0.65 % SOLN nasal spray Place 1 spray into both nostrils 2 (two) times daily as needed (sinus irritation).     [Paused] spironolactone-hydrochlorothiazide (ALDACTAZIDE) 25-25 MG tablet Take 1 tablet by mouth daily.     torsemide (DEMADEX) 20 MG tablet Take 20 mg by mouth daily.      traMADol  (ULTRAM ) 50 MG tablet Take 50 mg by mouth 2 (two) times daily.     traZODone (DESYREL) 50 MG tablet Take 25 mg by mouth at bedtime.     vancomycin (VANCOCIN) 125 MG capsule Take 1 capsule (125 mg total) by mouth 2 (two) times daily for 10 days. 20 capsule 0   No current facility-administered medications for this visit.     Abtx:  Anti-infectives (From admission, onward)    None       REVIEW OF SYSTEMS:  Const: negative fever, negative chills, negative weight loss Eyes: negative diplopia or visual changes, negative eye pain ENT: negative coryza, negative sore throat Resp: negative cough, hemoptysis, dyspnea Cards: negative for chest pain, palpitations, lower extremity edema GU: negative for frequency, dysuria and hematuria GI: Negative for abdominal pain, diarrhea, bleeding, constipation Skin: scalp wound Heme: negative for easy bruising and gum/nose bleeding MS: negative for  myalgias, arthralgias, back pain and muscle weakness Neurolo:negative for headaches, dizziness, vertigo, memory problems  Psych: negative for feelings of anxiety, depression  Endocrine: negative for thyroid, diabetes Allergy/Immunology- negative for any medication or food allergies ? Pertinent  Positives include : Objective:  VITALS:  BP 120/65   Pulse 77   Temp 97.7 F (36.5 C) (Temporal)   SpO2 96%   PHYSICAL EXAM:  General: Alert, cooperative, no distress, appears stated age.  Head: Scalp wound- surrounding redness resolved Some scabs present     ENT Nares normal. No drainage or sinus tenderness. Lips, mucosa, and tongue normal. ++thrush Neck: Supple, symmetrical, no adenopathy, thyroid: non tender no carotid bruit and no JVD. Back: No CVA tenderness. Lungs: Clear to auscultation bilaterally. No Wheezing or Rhonchi. No rales. Heart: Regular rate and rhythm, no murmur, rub or gallop. Abdomen: Soft, non-tender,not distended. Bowel sounds normal. No masses Extremities: atraumatic, no cyanosis. No edema. No clubbing Skin: No rashes or lesions. Or bruising Lymph: Cervical, supraclavicular normal. Neurologic: Grossly non-focal Pertinent Labs Lab Results CBC    Component Value Date/Time   WBC 5.7 10/28/2023 0541   RBC 3.84 (L) 10/28/2023 0541   HGB 11.5 (L) 10/28/2023 0541   HGB 11.6 (L) 10/02/2013 0505   HCT 35.8 (L) 10/28/2023 0541   HCT 43.1 09/18/2013 0919   PLT 301 10/28/2023 0541   PLT 212 10/02/2013 0505   MCV 93.2 10/28/2023 0541   MCV 96 09/18/2013 0919   MCH 29.9 10/28/2023 0541   MCHC 32.1 10/28/2023 0541   RDW 15.9 (H) 10/28/2023 0541   RDW 16.0 (H) 09/18/2013 0919   LYMPHSABS 1.1 10/28/2023 0541   MONOABS 0.6 10/28/2023 0541   EOSABS 0.5 10/28/2023 0541   BASOSABS 0.1 10/28/2023 0541       Latest Ref Rng & Units 10/28/2023    5:41 AM 10/27/2023    5:00 AM 10/26/2023   10:14 PM  CMP  Glucose 70 - 99 mg/dL 97  92  864   BUN 8 - 23 mg/dL 14  19   16    Creatinine 0.44 - 1.00 mg/dL 9.04  8.87  8.83   Sodium 135 - 145 mmol/L 132  134  135   Potassium 3.5 - 5.1 mmol/L 3.5  3.6  3.9   Chloride 98 - 111 mmol/L 95  95  94   CO2 22 - 32 mmol/L 27  27  29    Calcium 8.9 - 10.3 mg/dL 89.5  9.9  89.4   Total Protein 6.5 - 8.1 g/dL  5.4    Total Bilirubin 0.0 - 1.2 mg/dL  0.4    Alkaline Phos 38 - 126 U/L  69    AST 15 - 41 U/L  25    ALT 0 - 44 U/L  15        Microbiology: Recent Results (from the past 240 hours)  Culture, blood (Routine X 2) w Reflex to ID Panel     Status: None   Collection Time: 10/27/23 12:06 AM   Specimen: BLOOD  Result Value Ref Range Status   Specimen Description   Final    BLOOD RIGHT ANTECUBITAL Performed at [facility], [address], [place], [place] [address]    Special Requests   Final    Blood Culture adequate volume BOTTLES DRAWN AEROBIC AND ANAEROBIC Performed at [facility], [address], [place], [place] [address]    Culture   Final    NO GROWTH 5 DAYS Performed at [facility], [address] [place], [place] [address]    Report Status 11/01/2023 FINAL  Final  Culture, blood (Routine X 2) w Reflex to ID Panel     Status: None  Collection Time: 10/27/23 12:56 AM   Specimen: BLOOD  Result Value Ref Range Status   Specimen Description BLOOD SITE NOT SPECIFIED  Final   Special Requests   Final    BOTTLES DRAWN AEROBIC ONLY Blood Culture adequate volume   Culture   Final    NO GROWTH 5 DAYS Performed at [facility], [address], [place], [place] [address]    Report Status 11/01/2023 FINAL  Final  Aerobic Culture w Gram Stain (superficial specimen)     Status: None   Collection Time: 10/27/23 11:37 AM   Specimen: SCALP; Wound  Result Value Ref Range Status   Specimen Description   Final    SCALP Performed at [facility], [address] [place], [place] [address]    Special Requests   Final     WOUND Performed at [facility], [address], [place], [place] [address]    Gram Stain   Final    NO WBC SEEN RARE GRAM POSITIVE COCCI IN PAIRS Performed at [facility], [address], [place], [place] [address]    Culture   Final    FEW CITROBACTER FREUNDII FEW PSEUDOMONAS AERUGINOSA FEW STREPTOCOCCUS INTERMEDIUS    Report Status 10/30/2023 FINAL  Final   Organism ID, Bacteria CITROBACTER FREUNDII  Final   Organism ID, Bacteria PSEUDOMONAS AERUGINOSA  Final   Organism ID, Bacteria STREPTOCOCCUS INTERMEDIUS  Final      Susceptibility   Citrobacter freundii - MIC*    CEFEPIME <=0.12 SENSITIVE Sensitive     ERTAPENEM <=0.12 SENSITIVE Sensitive     CEFTRIAXONE <=0.25 SENSITIVE Sensitive     CIPROFLOXACIN <=0.06 SENSITIVE Sensitive     GENTAMICIN <=1 SENSITIVE Sensitive     MEROPENEM <=0.25 SENSITIVE Sensitive     TRIMETH/SULFA <=20 SENSITIVE Sensitive     PIP/TAZO Value in next row Sensitive      <=4 SENSITIVEThis is a modified FDA-approved test that has been validated and its performance characteristics determined by the reporting laboratory.  This laboratory is certified under the Clinical Laboratory Improvement Amendments CLIA as qualified to perform high complexity clinical laboratory testing.    * FEW CITROBACTER FREUNDII   Pseudomonas aeruginosa - MIC*    MEROPENEM Value in next row Sensitive      <=4 SENSITIVEThis is a modified FDA-approved test that has been validated and its performance characteristics determined by the reporting laboratory.  This laboratory is certified under the Clinical Laboratory Improvement Amendments CLIA as qualified to perform high complexity clinical laboratory testing.    CIPROFLOXACIN Value in next row Sensitive      <=4 SENSITIVEThis is a modified FDA-approved test that has been validated and its performance characteristics determined by the reporting laboratory.  This laboratory is certified under the Clinical  Laboratory Improvement Amendments CLIA as qualified to perform high complexity clinical laboratory testing.    IMIPENEM Value in next row Sensitive      <=4 SENSITIVEThis is a modified FDA-approved test that has been validated and its performance characteristics determined by the reporting laboratory.  This laboratory is certified under the Clinical Laboratory Improvement Amendments CLIA as qualified to perform high complexity clinical laboratory testing.    PIP/TAZO Value in next row Sensitive      <=4 SENSITIVEThis is a modified FDA-approved test that has been validated and its performance characteristics determined by the reporting laboratory.  This laboratory is certified under the Clinical Laboratory Improvement Amendments CLIA as qualified to perform  high complexity clinical laboratory testing.    CEFEPIME Value in next row Sensitive      <=4 SENSITIVEThis is a modified FDA-approved test that has been validated and its performance characteristics determined by the reporting laboratory.  This laboratory is certified under the Clinical Laboratory Improvement Amendments CLIA as qualified to perform high complexity clinical laboratory testing.    CEFTAZIDIME/AVIBACTAM Value in next row Sensitive      <=4 SENSITIVEThis is a modified FDA-approved test that has been validated and its performance characteristics determined by the reporting laboratory.  This laboratory is certified under the Clinical Laboratory Improvement Amendments CLIA as qualified to perform high complexity clinical laboratory testing.    CEFTOLOZANE/TAZOBACTAM Value in next row Sensitive      <=4 SENSITIVEThis is a modified FDA-approved test that has been validated and its performance characteristics determined by the reporting laboratory.  This laboratory is certified under the Clinical Laboratory Improvement Amendments CLIA as qualified to perform high complexity clinical laboratory testing.    TOBRAMYCIN Value in next row Sensitive       <=4 SENSITIVEThis is a modified FDA-approved test that has been validated and its performance characteristics determined by the reporting laboratory.  This laboratory is certified under the Clinical Laboratory Improvement Amendments CLIA as qualified to perform high complexity clinical laboratory testing.    CEFTAZIDIME Value in next row Sensitive      <=4 SENSITIVEThis is a modified FDA-approved test that has been validated and its performance characteristics determined by the reporting laboratory.  This laboratory is certified under the Clinical Laboratory Improvement Amendments CLIA as qualified to perform high complexity clinical laboratory testing.    * FEW PSEUDOMONAS AERUGINOSA   Streptococcus intermedius - MIC*    PENICILLIN Value in next row Sensitive      <=4 SENSITIVEThis is a modified FDA-approved test that has been validated and its performance characteristics determined by the reporting laboratory.  This laboratory is certified under the Clinical Laboratory Improvement Amendments CLIA as qualified to perform high complexity clinical laboratory testing.    CEFTRIAXONE Value in next row Sensitive      <=4 SENSITIVEThis is a modified FDA-approved test that has been validated and its performance characteristics determined by the reporting laboratory.  This laboratory is certified under the Clinical Laboratory Improvement Amendments CLIA as qualified to perform high complexity clinical laboratory testing.    ERYTHROMYCIN Value in next row Resistant      <=4 SENSITIVEThis is a modified FDA-approved test that has been validated and its performance characteristics determined by the reporting laboratory.  This laboratory is certified under the Clinical Laboratory Improvement Amendments CLIA as qualified to perform high complexity clinical laboratory testing.    LEVOFLOXACIN Value in next row Sensitive      <=4 SENSITIVEThis is a modified FDA-approved test that has been validated and its  performance characteristics determined by the reporting laboratory.  This laboratory is certified under the Clinical Laboratory Improvement Amendments CLIA as qualified to perform high complexity clinical laboratory testing.    VANCOMYCIN Value in next row Sensitive      <=4 SENSITIVEThis is a modified FDA-approved test that has been validated and its performance characteristics determined by the reporting laboratory.  This laboratory is certified under the Clinical Laboratory Improvement Amendments CLIA as qualified to perform high complexity clinical laboratory testing.    * FEW STREPTOCOCCUS INTERMEDIUS    Lines and Device Date on insertion # of days DC  Central line     Foley  ETT       IMAGING RESULTS: I have personally reviewed the films ? Impression/Recommendation The postoperative scalp wound infection is healing well. Initially caused by Pseudomonas, Citrobacter, and Streptococcus, with no MRSA. The wound is pinkish and healing, with no angry tissue or pus. - Continue ciprofloxacin until Sunday to complete ten days of antibiotics lcontinue linezolid- blood tests to monitor white count and liver function - Advise on wet-to-dry dressing changes - Encourage increased protein intake for wound healing  Oral candidiasis Oral candidiasis likely developed due to extensive antibiotic use. Reports sore throat, examination reveals white patches consistent with yeast infection. - Prescribe nystatin liquid, swish and swallow, 5 mL four times a day - Recommend plain Greek yogurt for probiotic benefits - Suggest Florastor probiotic? ? Communicated with patent and family Follow with plastic sureon ? ___I have personally spent  ---minutes involved in face-to-face and non-face-to-face activities for this patient on the day of the visit. Professional time spent includes the following activities: Preparing to see the patient (review of tests), Obtaining and/or reviewing separately obtained  history (admission/discharge record), Performing a medically appropriate examination and/or evaluation , Ordering medications/tests/procedures, referring and communicating with other health care professionals, Documenting clinical information in the EMR, Independently interpreting results (not separately reported), Communicating results to the patient/family/caregiver, Counseling and educating the patient/family/caregiver and Care coordination (not separately reported).    ________________________________________________ Discussed with patient, requesting provider Note:  This document was prepared using Dragon voice recognition software and may include unintentional dictation errors.

## 2023-11-03 ENCOUNTER — Ambulatory Visit: Payer: Self-pay | Admitting: Infectious Diseases

## 2023-11-03 ENCOUNTER — Other Ambulatory Visit
Admission: RE | Admit: 2023-11-03 | Discharge: 2023-11-03 | Disposition: A | Discharge status: Home / Self Care | Source: Ambulatory Visit | Attending: Infectious Diseases | Admitting: Infectious Diseases

## 2023-11-03 ENCOUNTER — Other Ambulatory Visit: Payer: Self-pay

## 2023-11-03 ENCOUNTER — Telehealth: Payer: Self-pay

## 2023-11-03 DIAGNOSIS — T148XXA Other injury of unspecified body region, initial encounter: Secondary | ICD-10-CM | POA: Diagnosis present

## 2023-11-03 DIAGNOSIS — L089 Local infection of the skin and subcutaneous tissue, unspecified: Secondary | ICD-10-CM | POA: Insufficient documentation

## 2023-11-03 LAB — CBC WITH DIFFERENTIAL/PLATELET
Abs Immature Granulocytes: 0.04 K/uL (ref 0.00–0.07)
Basophils Absolute: 0.1 K/uL (ref 0.0–0.1)
Basophils Relative: 1 %
Eosinophils Absolute: 0.1 K/uL (ref 0.0–0.5)
Eosinophils Relative: 2 %
HCT: 34.9 % — ABNORMAL LOW (ref 36.0–46.0)
Hemoglobin: 11.7 g/dL — ABNORMAL LOW (ref 12.0–15.0)
Immature Granulocytes: 1 %
Lymphocytes Relative: 13 %
Lymphs Abs: 1 K/uL (ref 0.7–4.0)
MCH: 30.7 pg (ref 26.0–34.0)
MCHC: 33.5 g/dL (ref 30.0–36.0)
MCV: 91.6 fL (ref 80.0–100.0)
Monocytes Absolute: 0.6 K/uL (ref 0.1–1.0)
Monocytes Relative: 8 %
Neutro Abs: 5.8 K/uL (ref 1.7–7.7)
Neutrophils Relative %: 75 %
Platelets: 281 K/uL (ref 150–400)
RBC: 3.81 MIL/uL — ABNORMAL LOW (ref 3.87–5.11)
RDW: 16 % — ABNORMAL HIGH (ref 11.5–15.5)
WBC: 7.5 K/uL (ref 4.0–10.5)
nRBC: 0 % (ref 0.0–0.2)

## 2023-11-03 NOTE — Telephone Encounter (Signed)
 Per [facility] lab they were unable to run patient's CBC w/ diff due to the lavender tube clotting to soon. I have reached out to the patient's daughter and requested that she go have it redrawn.  Stamatia Masri ONEIDA Ligas, CMA

## 2023-11-07 ENCOUNTER — Ambulatory Visit

## 2023-11-07 ENCOUNTER — Emergency Department

## 2023-11-07 ENCOUNTER — Emergency Department
Admission: EM | Admit: 2023-11-07 | Discharge: 2023-11-07 | Disposition: A | Discharge status: Home / Self Care | Attending: Emergency Medicine | Admitting: Emergency Medicine

## 2023-11-07 ENCOUNTER — Other Ambulatory Visit: Payer: Self-pay

## 2023-11-07 ENCOUNTER — Telehealth: Payer: Self-pay

## 2023-11-07 VITALS — BP 133/54 | HR 82 | Ht 60.0 in | Wt 129.4 lb

## 2023-11-07 DIAGNOSIS — Z85828 Personal history of other malignant neoplasm of skin: Secondary | ICD-10-CM | POA: Diagnosis not present

## 2023-11-07 DIAGNOSIS — I10 Essential (primary) hypertension: Secondary | ICD-10-CM | POA: Diagnosis not present

## 2023-11-07 DIAGNOSIS — W19XXXA Unspecified fall, initial encounter: Secondary | ICD-10-CM | POA: Diagnosis not present

## 2023-11-07 DIAGNOSIS — S0003XA Contusion of scalp, initial encounter: Secondary | ICD-10-CM

## 2023-11-07 DIAGNOSIS — S065X0A Traumatic subdural hemorrhage without loss of consciousness, initial encounter: Secondary | ICD-10-CM | POA: Diagnosis not present

## 2023-11-07 DIAGNOSIS — S0990XA Unspecified injury of head, initial encounter: Secondary | ICD-10-CM | POA: Diagnosis present

## 2023-11-07 DIAGNOSIS — S0100XD Unspecified open wound of scalp, subsequent encounter: Secondary | ICD-10-CM

## 2023-11-07 DIAGNOSIS — W010XXA Fall on same level from slipping, tripping and stumbling without subsequent striking against object, initial encounter: Secondary | ICD-10-CM | POA: Diagnosis not present

## 2023-11-07 DIAGNOSIS — Z09 Encounter for follow-up examination after completed treatment for conditions other than malignant neoplasm: Secondary | ICD-10-CM

## 2023-11-07 DIAGNOSIS — Y92000 Kitchen of unspecified non-institutional (private) residence as  the place of occurrence of the external cause: Secondary | ICD-10-CM | POA: Diagnosis not present

## 2023-11-07 DIAGNOSIS — Z5189 Encounter for other specified aftercare: Secondary | ICD-10-CM

## 2023-11-07 DIAGNOSIS — S065XAA Traumatic subdural hemorrhage with loss of consciousness status unknown, initial encounter: Secondary | ICD-10-CM

## 2023-11-07 HISTORY — DX: Disorder of kidney and ureter, unspecified: N28.9

## 2023-11-07 MED ORDER — LEVETIRACETAM 500 MG PO TABS: 500.0000 mg | ORAL_TABLET | Freq: Two times a day (BID) | ORAL | 0 refills | Status: DC

## 2023-11-07 NOTE — ED Provider Notes (Signed)
-----------------------------------------   12:37 PM on 11/07/2023 ----------------------------------------- I have personally seen and evaluate the patient in conjunction with physician assistant Tinnie Hudson.  I have personally reviewed the CT images patient appears to have a small subdural hematoma on the right side. Radiology confirms acute 5 mm right subdural hematoma with 2 mm leftward midline shift.  Cervical spine CT negative.  Patient states daily aspirin  no other anticoagulant.  The fall occurred yesterday approximately 20 hours ago.  We will discuss with neurosurgery for further recommendations.  I spoke with Dr. Clois, as the fall occurred 20 hours ago he believes the patient is safe for discharge home will follow-up with him in the office.   Dorothyann Drivers, MD 11/07/23 1308

## 2023-11-07 NOTE — ED Provider Notes (Addendum)
 [facility] Provider Note    Event Date/Time   First MD Initiated Contact with Patient 11/07/23 1158     (approximate)   History   Fall   HPI  Sheryl Kennedy is a 78 y.o. 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  Patient thinks that she slipped and did not feel dizzy prior to falling.  Patient had a Mohs procedure done to remove skin cancer and was at a follow-up appointment with plastic surgery daughter who saw the occipital hematoma who recommended she come to the ED for further evaluation of the hematoma.      Physical Exam   Triage Vital Signs: ED Triage Vitals  Encounter Vitals Group     BP 11/07/23 1125 (!) 106/55     Girls Systolic BP Percentile --      Girls Diastolic BP Percentile --      Boys Systolic BP Percentile --      Boys Diastolic BP Percentile --      Pulse Rate 11/07/23 1125 81     Resp 11/07/23 1125 16     Temp 11/07/23 1125 98.4 F (36.9 C)     Temp Source 11/07/23 1125 Oral     SpO2 11/07/23 1125 98 %     Weight 11/07/23 1126 129 lb 6.4 oz (58.7 kg)     Height 11/07/23 1126 5' (1.524 m)     Head Circumference --      Peak Flow --      Pain Score 11/07/23 1125 4     Pain Loc --      Pain Education --      Exclude from Growth Chart --     Most recent vital signs: Vitals:   11/07/23 1125  BP: (!) 106/55  Pulse: 81  Resp: 16  Temp: 98.4 F (36.9 C)  SpO2: 98%   General: Awake, no distress.  CV:  Good peripheral perfusion. RRR. Resp:  Normal effort. CTAB. Abd:  No distention.  Other:  PERRL, EOM intact, no ataxia, no pronator drift, symmetric facial movements.  Occipital hematoma present, scalp wound present on the vertex of the scalp with gauze packing and gauze wrap over top of the head.   ED Results / Procedures / Treatments   Labs (all labs ordered are listed, but only abnormal results are displayed) Labs  Reviewed - No data to display   RADIOLOGY  CT Head and cervical spine obtained, interpreted the images as well as reviewed the radiologist report.  She does show an acute 5 mm thick right subdural hematoma with trace leftward midline shift of 2 mm.  No fracture or traumatic alignment of the cervical spine.  There is a right posterior scalp contusion and a chronic vertex anterior scalp ulceration   PROCEDURES:  Critical Care performed: No  Procedures   MEDICATIONS ORDERED IN ED: Medications - No data to display   IMPRESSION / MDM / ASSESSMENT AND PLAN / ED COURSE  I reviewed the triage vital signs and the nursing notes.                             78 year old female presents for evaluation of a head injury after a fall.  Vital signs are stable, patient NAD on exam.  Differential diagnosis includes, but is not limited to, contusion, hematoma, laceration, abrasion, intracranial  bleed, skull fracture, muscle strain, spine fracture.  Patient's presentation is most consistent with acute complicated illness / injury requiring diagnostic workup.  CT of the head shows a subdural hematoma with trace leftward midline shift.  There is also a right posterior scalp contusion and chronic vertex anterior scalp ulceration.  My attending physician spoke with the on-call neurosurgeon who recommended no further imaging or observation and outpatient follow-up with him in the office as this injury happened yesterday.  Neurosurgery would like patient to take 500 mg of keppra twice a day for one week. I will prescribed this for her. Patient advised to hold her aspirin  for 1 week.  Discussed taking only Tylenol  as needed for pain.  Advised her to avoid use of any NSAIDs.  Discussed return precautions.  Patient and family members voiced understanding, all questions were answered and she was stable at discharge.      FINAL CLINICAL IMPRESSION(S) / ED DIAGNOSES   Final diagnoses:  Subdural hematoma (HCC)   Fall, initial encounter     Rx / DC Orders   ED Discharge Orders     None        Note:  This document was prepared using Dragon voice recognition software and may include unintentional dictation errors.   Cleaster Tinnie LABOR, PA-C 11/07/23 1306    Cleaster Tinnie LABOR, PA-C 11/07/23 1314    Dorothyann Drivers, MD 11/07/23 1443

## 2023-11-07 NOTE — Telephone Encounter (Signed)
 The ER contacted Dr Clois about this patient. Per Dr Clois, she will need a new patient appointment with a PA in 2-3 weeks, with a repeat head CT w/o contrast prior to the appointment. I have placed the order for the CT.

## 2023-11-07 NOTE — Discharge Instructions (Signed)
 The CT scan of your head showed a small brain bleed.  The CT scan of your neck was normal.  They also noted a scalp contusion which is the bump you feel on the back of your head and the chronic ulceration on the top of your head.  Please schedule a follow-up appointment with the neurosurgeon attached.  You will need to call to schedule.  Please do not take your aspirin  for 1 week.  After this you can resume taking it.  For treatment of pain you can take Tylenol .  Do not take any NSAIDs as this can increase the risk of bleeding.  NSAIDs include Motrin , ibuprofen , Advil , Aleve, naproxen, Naprosyn, Celebrex.  Please return to the emergency department with any worsening symptoms.

## 2023-11-07 NOTE — ED Triage Notes (Signed)
 Pt to ED for mechanical fall last night after slipping on kitchen floor. Pt hit back of head. Just finished  abx for possible infx to surgical site on top of head. Was also treated recently for c diff. Head currently wrapped. Denies LOC and neck pain but states did hit head really hard. Alert, oriented. No blood thinners.

## 2023-11-07 NOTE — Progress Notes (Signed)
 Established Patient Office Visit  Subjective   Patient ID: Sheryl Kennedy, female    DOB: 07/28/1945  Age: 78 y.o. MRN: [id number]  Chief Complaint  Patient presents with   Follow-up    HPI  Sheryl Kennedy is a 78 y.o. year old female who presents after Mohs micrographic surgery for a squamous cell carcinoma (SCC) located on the right crown of the scalp, treated on 09/12/23, with secondary intention healing.Very minimal granulation tissue noted despite multiple attempts to promote healing, including amnion allograft placement at one of the visits with Dr. Corey. Likely delayed wound healing multifactorial.  Patient is a non-smoker.  Patient was referred to me by Dr. Paci for management of chronic scalp wound.  The patient had a scalp infection at the site of previous Mohs bolster.  I debrided the wound at the bedside and we started Vashe wet-to-dry dressings twice daily.  Patient is doing much better after being discharged from the hospital from a wound standpoint, continues to heal. Yet, yesterday she had a fall without LOC but has a hematoma on the occipital scalp, has not gone to the ER. Patient comes today accompanied by daughter and husband to discuss reconstructive options. I introduced myself as Dr.Xara Paulding M Saniyya Gau.    Objective:     BP (!) 133/54 (BP Location: Left Arm, Patient Position: Sitting, Cuff Size: Normal)   Pulse 82   Ht 5' (1.524 m)   Wt 129 lb 6.4 oz (58.7 kg)   SpO2 94%   BMI 25.27 kg/m  BP Readings from Last 3 Encounters:  11/07/23 (!) 133/54  11/02/23 120/65  10/31/23 (!) 143/78      Physical Exam  General appearance: alert, cooperative, and no distress Moving all 4 extremities. Scalp Wound size 2.5 x 2.5 cm, erythematous edges resolved with local wound care. Patient also has a hematoma on the right occipital area measuring 3.5 cm x 3 cm x 2.5 cm  Last CBC Lab Results  Component Value Date   WBC 7.5 11/03/2023   HGB 11.7 (L) 11/03/2023   HCT 34.9  (L) 11/03/2023   MCV 91.6 11/03/2023   MCH 30.7 11/03/2023   RDW 16.0 (H) 11/03/2023   PLT 281 11/03/2023      Assessment & Plan:   Problem List Items Addressed This Visit   None Visit Diagnoses       Follow-up exam    -  Primary     Visit for wound check         Fall in home, initial encounter         Hematoma of scalp, initial encounter          Patient presents today with a chronic wound at the vertex of the scalp status post Mohs surgery for squamous cell carcinoma.  The wound has improved with local wound care now measures 2.5 cm x 2.5 cm.  Patient also had a fall yesterday and presents with a decent sized hematoma on the right occipital scalp.  Patient has not pursued care after the fall.  The skin around the hematoma is bruised.  I explained to the patient I am going to delay the tissue rearrangement/flap until this hematoma resolves.  We will continue with local wound care as the patient is showing signs of improvement.  I also recommended the patient and family to go to the emergency department for CT scan of the head and for further evaluation after this visit is concluded.  Patient and family  expressed understanding and agree with the plan.  Patient will follow-up next week with a PA for wound check.  Patient will follow-up with me in 2 weeks for wound check as well.  Teah Votaw M Herberta Pickron, MD

## 2023-11-08 ENCOUNTER — Ambulatory Visit: Admitting: Dermatology

## 2023-11-08 NOTE — Telephone Encounter (Signed)
Noted. I will send a message to scheduling.

## 2023-11-09 ENCOUNTER — Telehealth: Payer: Self-pay

## 2023-11-09 ENCOUNTER — Other Ambulatory Visit: Payer: Self-pay | Admitting: Infectious Diseases

## 2023-11-09 DIAGNOSIS — A0472 Enterocolitis due to Clostridium difficile, not specified as recurrent: Secondary | ICD-10-CM

## 2023-11-09 MED ORDER — FLUCONAZOLE 100 MG PO TABS: 100.0000 mg | ORAL_TABLET | Freq: Every day | ORAL | 0 refills | Status: DC

## 2023-11-09 NOTE — Telephone Encounter (Signed)
 Patient's daughter informed that fluconazole sent and to pick up a specimen cup from the lab for CDIFF testing if patient is still having lose stools. Patient's daughter also asked if patient could use Peridex . Please send patient a mychart msg if ok to use. She will be on the look out for the message. Patient daughter has Peridex  at her dental office she is wanting to give the patient

## 2023-11-09 NOTE — Telephone Encounter (Signed)
 Patient's daughter called stated that the patient is still having issues with thrush and would like a refill on the mouth rinse. Patient also complaining of diarrhea that started yesterday and has concerns of possible cdiff or if she should restart vanc.  Renad Jenniges ONEIDA Ligas, CMA

## 2023-11-13 ENCOUNTER — Ambulatory Visit

## 2023-11-13 ENCOUNTER — Encounter

## 2023-11-14 ENCOUNTER — Ambulatory Visit: Admitting: Physician Assistant

## 2023-11-14 DIAGNOSIS — Z9889 Other specified postprocedural states: Secondary | ICD-10-CM

## 2023-11-14 DIAGNOSIS — S0100XD Unspecified open wound of scalp, subsequent encounter: Secondary | ICD-10-CM | POA: Diagnosis not present

## 2023-11-14 DIAGNOSIS — Z85828 Personal history of other malignant neoplasm of skin: Secondary | ICD-10-CM | POA: Diagnosis not present

## 2023-11-14 DIAGNOSIS — S0003XD Contusion of scalp, subsequent encounter: Secondary | ICD-10-CM

## 2023-11-14 NOTE — Progress Notes (Signed)
 Patient is a pleasant 78 year old female s/p Mohs excision for SCC right scalp 09/12/2023 by dermatology who was following with our office for ongoing wound management.  She was last seen here in clinic on 11/07/2023.  At that time, her Mohs excision site appeared to be have slightly improved.  Prolonged wound healing suspected to be multifactorial.  Wound measured 2.5 x 2.5 cm.  However, she had recently sustained a fall and was sent to the ED, ultimately found to have subdural hematoma.  Today, patient is accompanied by spouse and son at bedside.  She reports a lot of bruising from the hematoma moving inferiorly along the right side of the neck into upper chest and trapezial region.  She admits that she did not hold her ASA 81 mg until yesterday and had continued taking it since her initial injury and ER encounter.  That said, she denies any new or worsening headache, blurred vision, or focal neurologic deficits.  Her son at bedside states that she had been thinking a bit more slowly, but that she seemed much better today.  She reports having an appointment with her primary care provider this week as well as her neurosurgery office in the next couple weeks.  She also has repeat CT imaging scheduled for next week.  On exam, her wound is approximately 2.5 x 2.25 cm.  Exposed calvarium.  Minimal granulation anteriorly.  No advancing epithelialization.  Surrounding skin is without erythema or induration otherwise concerning for infection.  Area is nontender.    Posteriorly on the scalp, she has an approximately 4 x 4 centimeter hematoma.  It does feel soft and a bit reducible, skin is not overly taut or at risk for ischemic injury.  That said, certainly does not appear healthy enough to be utilized as rotational flap.  She understands that she needs to go to the ED should she develop any new or worsening neurologic symptoms or headache that could otherwise suggest evolving hematoma.  She understands to  continue holding ASA until cleared.  Otherwise, continue to follow-up with primary care and neurosurgery as scheduled.  Follow-up with Dr. Montorfano next week for ongoing surgical planning.

## 2023-11-15 ENCOUNTER — Other Ambulatory Visit
Admission: RE | Admit: 2023-11-15 | Discharge: 2023-11-15 | Disposition: A | Discharge status: Home / Self Care | Source: Ambulatory Visit | Attending: Infectious Diseases | Admitting: Infectious Diseases

## 2023-11-15 ENCOUNTER — Telehealth: Payer: Self-pay | Admitting: Neurosurgery

## 2023-11-15 DIAGNOSIS — A0472 Enterocolitis due to Clostridium difficile, not specified as recurrent: Secondary | ICD-10-CM | POA: Diagnosis present

## 2023-11-15 LAB — C DIFFICILE QUICK SCREEN W PCR REFLEX
C Diff antigen: NEGATIVE
C Diff interpretation: NOT DETECTED
C Diff toxin: NEGATIVE

## 2023-11-15 NOTE — Telephone Encounter (Signed)
 Patient's daughter is calling with some concerns of her swelling and bruising. She states that the swelling has moved down her neck and into her shoulder. She states that her mother seems more forgetful and she has some slurred speech. She states that her mother forgot that she needed to stop taking her Aspirin  and was taking this up until Sunday. Please advise

## 2023-11-15 NOTE — Telephone Encounter (Signed)
 Called patient daughter to follow up on patient AMS symptoms. Informed her that, unfortunately, we are limited in the advice we can provide at this time as she has not actually been seen as a patient with us  yet. Advised her to take patient to ER if she is having AMS, slurred speech, or new concerning symptoms that are different from her normal self. Also advised her to reach out to her PCP if she had concerns and wanted further advice.

## 2023-11-16 ENCOUNTER — Ambulatory Visit: Payer: Self-pay | Admitting: Infectious Diseases

## 2023-11-17 ENCOUNTER — Encounter: Payer: Self-pay | Admitting: Internal Medicine

## 2023-11-17 ENCOUNTER — Ambulatory Visit
Admission: RE | Admit: 2023-11-17 | Discharge: 2023-11-17 | Disposition: A | Discharge status: Home / Self Care | Source: Ambulatory Visit | Attending: Internal Medicine | Admitting: Internal Medicine

## 2023-11-17 ENCOUNTER — Other Ambulatory Visit: Payer: Self-pay | Admitting: Internal Medicine

## 2023-11-17 DIAGNOSIS — S065XAA Traumatic subdural hemorrhage with loss of consciousness status unknown, initial encounter: Secondary | ICD-10-CM

## 2023-11-17 DIAGNOSIS — I10 Essential (primary) hypertension: Secondary | ICD-10-CM | POA: Diagnosis present

## 2023-11-21 ENCOUNTER — Ambulatory Visit

## 2023-11-21 ENCOUNTER — Telehealth: Payer: Self-pay

## 2023-11-21 NOTE — Telephone Encounter (Signed)
 Patient daughter Consuelo called asking about c-diff results -- informed her the results were negative. Consuelo stated that patient has been having diarrhea since yesterday and wanted to know if patient needs to do another c-diff test.    Left vm with patient asking for a call back to ask her symptoms.    Aadil Sur SHAUNNA Letters, CMA

## 2023-11-22 ENCOUNTER — Emergency Department

## 2023-11-22 ENCOUNTER — Ambulatory Visit

## 2023-11-22 ENCOUNTER — Other Ambulatory Visit: Payer: Self-pay

## 2023-11-22 ENCOUNTER — Emergency Department
Admission: EM | Admit: 2023-11-22 | Discharge: 2023-11-22 | Disposition: A | Discharge status: Home / Self Care | Attending: Emergency Medicine | Admitting: Emergency Medicine

## 2023-11-22 DIAGNOSIS — R0789 Other chest pain: Secondary | ICD-10-CM | POA: Diagnosis present

## 2023-11-22 DIAGNOSIS — E878 Other disorders of electrolyte and fluid balance, not elsewhere classified: Secondary | ICD-10-CM | POA: Insufficient documentation

## 2023-11-22 DIAGNOSIS — E871 Hypo-osmolality and hyponatremia: Secondary | ICD-10-CM | POA: Diagnosis not present

## 2023-11-22 DIAGNOSIS — R079 Chest pain, unspecified: Secondary | ICD-10-CM

## 2023-11-22 DIAGNOSIS — D72829 Elevated white blood cell count, unspecified: Secondary | ICD-10-CM | POA: Insufficient documentation

## 2023-11-22 LAB — COMPREHENSIVE METABOLIC PANEL WITH GFR
ALT: 18 U/L (ref 0–44)
AST: 38 U/L (ref 15–41)
Albumin: 4.1 g/dL (ref 3.5–5.0)
Alkaline Phosphatase: 95 U/L (ref 38–126)
Anion gap: 14 (ref 5–15)
BUN: 31 mg/dL — ABNORMAL HIGH (ref 8–23)
CO2: 27 mmol/L (ref 22–32)
Calcium: 10.9 mg/dL — ABNORMAL HIGH (ref 8.9–10.3)
Chloride: 89 mmol/L — ABNORMAL LOW (ref 98–111)
Creatinine, Ser: 1.22 mg/dL — ABNORMAL HIGH (ref 0.44–1.00)
GFR, Estimated: 45 mL/min — ABNORMAL LOW (ref >60)
Glucose, Bld: 102 mg/dL — ABNORMAL HIGH (ref 70–99)
Potassium: 4.3 mmol/L (ref 3.5–5.1)
Sodium: 130 mmol/L — ABNORMAL LOW (ref 135–145)
Total Bilirubin: 0.7 mg/dL (ref 0.0–1.2)
Total Protein: 6.6 g/dL (ref 6.5–8.1)

## 2023-11-22 LAB — CBC WITH DIFFERENTIAL/PLATELET
Abs Immature Granulocytes: 0.07 K/uL (ref 0.00–0.07)
Basophils Absolute: 0 K/uL (ref 0.0–0.1)
Basophils Relative: 0 %
Eosinophils Absolute: 0.1 K/uL (ref 0.0–0.5)
Eosinophils Relative: 1 %
HCT: 36.3 % (ref 36.0–46.0)
Hemoglobin: 12.1 g/dL (ref 12.0–15.0)
Immature Granulocytes: 1 %
Lymphocytes Relative: 9 %
Lymphs Abs: 1.1 K/uL (ref 0.7–4.0)
MCH: 30.4 pg (ref 26.0–34.0)
MCHC: 33.3 g/dL (ref 30.0–36.0)
MCV: 91.2 fL (ref 80.0–100.0)
Monocytes Absolute: 1.1 K/uL — ABNORMAL HIGH (ref 0.1–1.0)
Monocytes Relative: 9 %
Neutro Abs: 10 K/uL — ABNORMAL HIGH (ref 1.7–7.7)
Neutrophils Relative %: 80 %
Platelets: 323 K/uL (ref 150–400)
RBC: 3.98 MIL/uL (ref 3.87–5.11)
RDW: 15.9 % — ABNORMAL HIGH (ref 11.5–15.5)
WBC: 12.4 K/uL — ABNORMAL HIGH (ref 4.0–10.5)
nRBC: 0 % (ref 0.0–0.2)

## 2023-11-22 LAB — TROPONIN T, HIGH SENSITIVITY
Troponin T High Sensitivity: 41 ng/L — ABNORMAL HIGH (ref 0–19)
Troponin T High Sensitivity: 40 ng/L — ABNORMAL HIGH (ref 0–19)

## 2023-11-22 LAB — PROTIME-INR
INR: 1.1 (ref 0.8–1.2)
Prothrombin Time: 15.3 s — ABNORMAL HIGH (ref 11.4–15.2)

## 2023-11-22 LAB — APTT: aPTT: 27 s (ref 24–36)

## 2023-11-22 MED ORDER — SODIUM CHLORIDE 0.9 % IV BOLUS
500.0000 mL | Freq: Once | INTRAVENOUS | Status: AC
  Administered 2023-11-22: 500 mL via INTRAVENOUS

## 2023-11-22 NOTE — ED Triage Notes (Signed)
 Pt arrives via EMS from home for CP that started while pt was on toilet having bowel movement

## 2023-11-22 NOTE — Discharge Instructions (Addendum)
 Your workup was reassuring did not show evidence of heart attack and given you have resolution of symptoms I think it is reasonable for you to go home.  You do look slightly dehydrated with low sodium and low chloride so make sure you are drinking plenty of fluids with electrolytes in it such as Pedialyte or Gatorade without sugar.  You should have this rechecked with your primary care doctor in a couple of days as I have given you some fluids here to try to help with this.  You should call your cardiologist to make a follow-up appointment return to the ER if develop worsening or changing in symptoms or any other concerns.  Can operate future heart attacks so if you develop chest pain again you should return to the ER

## 2023-11-22 NOTE — ED Provider Notes (Signed)
 [facility] Provider Note    Event Date/Time   First MD Initiated Contact with Patient 11/22/23 1924     (approximate)   History   Chest Pain   HPI  Sheryl Kennedy is a 78 y.o. female with a history of subdural hematoma who comes in with chest pain that started while patient was on the toilet having a bowel movement.  Patient reported having a little bit of chest pain prior to getting on the toilet but then felt like she had to have a bowel movement and the chest pain did get a little bit worse.  She reports it was a sharp chest pain in the middle of her chest.  She denies any chest pain upon evaluation.  She denies any shortness of breath.  Recently taken off aspirin  due to subdural.  She denies any recurrent falls or hitting her head.  She reports that she just recently had a CT scan done on 11/14 that was unchanged from prior and she denies any new headaches or confusions or falls or hitting her head.  She does report a history of blood clots but denies any concerns with shortness of breath or new swelling her legs.  At baseline  her left leg is slightly enlarged compared to the right leg but she reports that is due to prior surgeries denies any concerns for any calf tenderness or or shortness of breath today. She does report having multiple episodes of diarrhea and she is followed by infectious disease due to her history of C. difficile.  She states that she was recently tested and I reviewed a note from her on 11/13 that patient was not actively positive for C. difficile so they are holding off on many occasions but she is taking Imodium and other medications to try to help with the diarrhea.  She initially got C. difficile when she was given antibiotics for a scalp infection status post Mohs surgery for basal cell carcinoma.  She does have follow-up with this doctor on Friday to reevaluate the area but husband denies there being any new difference to it  recently.    Physical Exam   Triage Vital Signs: ED Triage Vitals [11/22/23 1926]  Encounter Vitals Group     BP      Girls Systolic BP Percentile      Girls Diastolic BP Percentile      Boys Systolic BP Percentile      Boys Diastolic BP Percentile      Pulse      Resp      Temp      Temp src      SpO2      Weight 128 lb (58.1 kg)     Height 5' (1.524 m)     Head Circumference      Peak Flow      Pain Score 0     Pain Loc      Pain Education      Exclude from Growth Chart     Most recent vital signs: Vitals:   11/22/23 2000 11/22/23 2030  BP: 134/75 126/69  Pulse: 86 87  Resp: 19 20  Temp:    SpO2: 97% 100%     General: Awake, no distress.  CV:  Good peripheral perfusion.  No chest wall tenderness.  No murmur.  Equal DP equal radial pulses Resp:  Normal effort.  Clear lung Abd:  No distention.  Soft and nontender Other:  Scalp area was evaluated without any erythema or redness. Slightly enlarged leg on the left compared to the right but no pitting edema.  She reports this is baseline for her.  No calf tenderness.  ED Results / Procedures / Treatments   Labs (all labs ordered are listed, but only abnormal results are displayed) Labs Reviewed  CBC WITH DIFFERENTIAL/PLATELET - Abnormal; Notable for the following components:      Result Value   WBC 12.4 (*)    RDW 15.9 (*)    Neutro Abs 10.0 (*)    Monocytes Absolute 1.1 (*)    All other components within normal limits  COMPREHENSIVE METABOLIC PANEL WITH GFR - Abnormal; Notable for the following components:   Sodium 130 (*)    Chloride 89 (*)    Glucose, Bld 102 (*)    BUN 31 (*)    Creatinine, Ser 1.22 (*)    Calcium 10.9 (*)    GFR, Estimated 45 (*)    All other components within normal limits  PROTIME-INR - Abnormal; Notable for the following components:   Prothrombin Time 15.3 (*)    All other components within normal limits  TROPONIN T, HIGH SENSITIVITY - Abnormal; Notable for the following  components:   Troponin T High Sensitivity 41 (*)    All other components within normal limits  TROPONIN T, HIGH SENSITIVITY - Abnormal; Notable for the following components:   Troponin T High Sensitivity 40 (*)    All other components within normal limits  APTT     EKG  My interpretation of EKG:  Normal sinus rate of 85 without any ST elevation or T wave inversions, normal intervals  RADIOLOGY I have reviewed the xray personally and interpreted no evidence of any pneumonia   PROCEDURES:  Critical Care performed: No  .1-3 Lead EKG Interpretation  Performed by: Ernest Ronal BRAVO, MD Authorized by: Ernest Ronal BRAVO, MD     Interpretation: normal     ECG rate:  80   ECG rate assessment: normal     Rhythm: sinus rhythm     Ectopy: none     Conduction: normal      MEDICATIONS ORDERED IN ED: Medications  sodium chloride  0.9 % bolus 500 mL (0 mLs Intravenous Stopped 11/22/23 2219)     IMPRESSION / MDM / ASSESSMENT AND PLAN / ED COURSE  I reviewed the triage vital signs and the nursing notes.   Patient's presentation is most consistent with acute presentation with potential threat to life or bodily function.      INITIAL IMPRESSION / ASSESSMENT AND PLAN / ED COURSE   Most Likely DDx:  -Consider ACS vs MSK Vs Thalia- will get EKG/troponin to evaluate for ACS       DDx that was also considered d/t potential to cause harm, but was found less likely based on history and physical (as detailed above): -PNA (no fevers, cough but CXR to evaluate) -PNX (reassured with equal b/l breath sounds, CXR to evaluate) -Symptomatic anemia (will get H&H) -Pulmonary embolism as no sob, not pleuritic in nature, no hypoxia -Aortic Dissection as no tearing pain and no radiation to the mid back, no murmur and given the fact patient had resolution of pain this seems less likely -Pericarditis no EKG changes or hx to suggest dx -Tamponade (no notable SOB, tachycardic, hypotensive) -Esophageal  rupture (no h/o diffuse vomitting/no crepitus)    At this time given re-assuring workup I have considered CT imaging to rule out PE/Dissection but  given history and physical exam these seem less likely and potential harm from CT would outweigh the probability of finding PE or Dissection.   9:32 PM Slowly elevated white count she denies any infectious symptoms and the area on her scalp is well-appearing.  She has got follow-up on Friday for wound check.  I do not want to start her antibiotics given her history of C. difficile and family is agreeable and they will monitor for any fevers and return if they notice any drainage from the area.  Her initial troponin slightly elevated but could be related to her CKD.  She does have slightly low sodium and chloride and she does report multiple episodes of diarrhea so we will give some gentle fluids.  Her kidney function is around her baseline.  Discussed with patient that if repeat troponin is uptrending that we would recommend admission to the hospital however if stable or downtrending then consider discharge home.  She remains without any symptoms.  10:26 PM Initial troponin slightly elevated but this could be secondary to CKD.  Her repeat troponin was stable at 40.  Her CBC does show slightly elevated white count but really she denies any infectious symptoms she denied any urinary symptoms no evidence of infection on her scalp and do not want to start any antibiotics unless necessary given her history of C. difficile.  She does report multiple episodes of diarrhea but she has not given any here to send for testing and was actually just tested recently by ID and not concerning for C. difficile.  She does have some slightly low sodium and chloride which I do suspect is related to dehydration so should be given some fluids.  Her repeat troponin was stable at 41 and she continues to deny any chest pain   I have considered admission for patient, but given  re-assuring workup including EKG, troponin, and re-assuring vitals patient can follow up outpatient with cardiology.    Discussed with patient that I can not predict future heart attacks and that cardiology can evaluate for need for further workup including stress test.  Explained to patient that if there is a change in symptoms, worsening symptoms, or any other concerns that they should return for repeat evaluation to have repeat EKG/troponin. They expressed understanding.  ____________________________________________ patient is on the cardiac monitor to evaluate for evidence of arrhythmia and/or significant heart rate changes.      FINAL CLINICAL IMPRESSION(S) / ED DIAGNOSES   Final diagnoses:  Nonspecific chest pain     Rx / DC Orders   ED Discharge Orders     None        Note:  This document was prepared using Dragon voice recognition software and may include unintentional dictation errors.   Ernest Ronal BRAVO, MD 11/22/23 2228

## 2023-11-22 NOTE — Progress Notes (Addendum)
 Referring Physician:  Auston Reyes BIRCH, MD [id number] [facility] [place],  [place] [address]  Primary Physician:  Auston Reyes BIRCH, MD  History of Present Illness: 11/27/2023 Ms. Jannah Guardiola is here today for follow up of SDH. Unfortunately she did not go for the recommended follow up CT head.  Today she describes another fall that happened yesterday and as a result has a lump on the back of her head. She also describes tingling in the left side of her face and a sensation like she is dragging her left foot. This is what she seems to think is causing her to fall. Her feet feel as though they are stuck to the floor and she has a hard time lifting them when she walks. She cannot really recall over what time period these symptoms began.  She states she has just recently started to need a rollator when walking.   ALETHEA TERHAAR has some symptoms of cervical myelopathy.  The symptoms are causing a significant impact on the patient's life.   Review of Systems:  A 10 point review of systems is negative, except for the pertinent positives and negatives detailed in the HPI.  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 Left forearm. Excision 02/01/2023   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 complications     Past Surgical History: Past Surgical History:  Procedure Laterality Date   BACK  SURGERY  2008   no metal in back   BREAST BIOPSY Right 09/13/2010   core/neg   BREAST BIOPSY Right 12/09/2015   us  biopsy/neg   BREAST SURGERY Right 2012   CATARACT EXTRACTION W/PHACO Left 04/25/2022   Procedure: CATARACT EXTRACTION PHACO AND INTRAOCULAR LENS PLACEMENT (IOC) LEFT 3.42 00:25.2;  Surgeon: Myrna Adine Anes, MD;  Location: [facility];  Service: Ophthalmology;  Laterality: Left;   CATARACT EXTRACTION W/PHACO Right 05/23/2022   Procedure: CATARACT EXTRACTION PHACO AND INTRAOCULAR LENS PLACEMENT (IOC) RIGHT 3.84 00:28.1;  Surgeon: Myrna Adine Anes, MD;  Location: [facility];  Service: Ophthalmology;  Laterality: Right;   CHONDROPLASTY Left 01/22/2016   Procedure: arthroscopic medial AND LATERAL CHONDROPLASTY;  Surgeon: Lynwood SHAUNNA Hue, MD;  Location: [facility] ORS;  Service: Orthopedics;  Laterality: Left;   COLONOSCOPY  2011   COLONOSCOPY WITH PROPOFOL  N/A 04/14/2015   Procedure: COLONOSCOPY WITH PROPOFOL ;  Surgeon: Gladis RAYMOND Mariner, MD;  Location: [facility];  Service: Endoscopy;  Laterality: N/A;   CYST REMOVAL HAND Left 2012   JOINT REPLACEMENT Right 09/30/2013   TOTAL KNEE REPLACEMENT, DR. HUE, [facility]   KNEE ARTHROSCOPY WITH MEDIAL MENISECTOMY Left 01/22/2016   Procedure: KNEE ARTHROSCOPY WITH MEDIAL AND LATERAL MENISECTOMY;  Surgeon: Lynwood SHAUNNA Hue, MD;  Location: [facility] ORS;  Service: Orthopedics;  Laterality: Left;   KNEE SURGERY  1997  LEFT HEART CATH AND CORONARY ANGIOGRAPHY N/A 10/25/2021   Procedure: LEFT HEART CATH AND CORONARY ANGIOGRAPHY;  Surgeon: Mady Bruckner, MD;  Location: [facility] INVASIVE CV LAB;  Service: Cardiovascular;  Laterality: N/A;   Stab pheblectomy  Left 2013   TONSILLECTOMY     VEIN CLOSURE Bilateral 2012    Allergies: Allergies as of 11/27/2023 - Review Complete 11/24/2023  Allergen Reaction Noted   Ery-tab [erythromycin] Other (See Comments) 07/13/2021   Fosamax [alendronate] Other (See Comments)    Levaquin [levofloxacin]  Other (See Comments) 01/10/2019   Aleve [naproxen] Swelling and Other (See Comments) 06/12/2014   Shellfish allergy Swelling, Dermatitis, and Other (See Comments) 09/17/2012   Sudafed [pseudoephedrine] Rash and Other (See Comments) 05/02/2013   Sulfa antibiotics Rash 06/01/2012    Medications: Outpatient Encounter Medications as of 11/27/2023  Medication Sig   acetaminophen  (TYLENOL ) 500 MG tablet Take 1,000 mg by mouth 2 (two) times daily as needed for fever or headache (pain).   allopurinol  (ZYLOPRIM ) 300 MG tablet Take 300 mg by mouth daily.   feeding supplement (ENSURE PLUS HIGH PROTEIN) LIQD Take 237 mLs by mouth 2 (two) times daily between meals.   fexofenadine (ALLEGRA) 180 MG tablet Take 80 mg by mouth at bedtime.   fluconazole  (DIFLUCAN ) 100 MG tablet Take 1 tablet (100 mg total) by mouth daily.   ketoconazole  (NIZORAL ) 2 % shampoo WASH SCALP 2-3 TIMES WEEKLY, LET SIT 5 MINUTES AND RINSE (Patient taking differently: Apply 1 Application topically See admin instructions. Apply topically to scalp up to twice a week as needed for scalp irritation.)   levothyroxine  (SYNTHROID ) 50 MCG tablet Take 50 mcg by mouth daily before breakfast.   metoprolol  succinate (TOPROL -XL) 25 MG 24 hr tablet Take 25 mg by mouth daily.    Multiple Vitamins-Minerals (CENTRUM SILVER WOMEN 50+) TABS Take 1 tablet by mouth daily.   potassium chloride  SA (K-DUR,KLOR-CON ) 20 MEQ tablet Take 20 mEq by mouth 3 (three) times daily.    saccharomyces boulardii (FLORASTOR) 250 MG capsule Take 1 capsule (250 mg total) by mouth 2 (two) times daily.   simvastatin  (ZOCOR ) 20 MG tablet Take 20 mg by mouth at bedtime.   [Paused] spironolactone-hydrochlorothiazide (ALDACTAZIDE) 25-25 MG tablet Take 1 tablet by mouth daily.   torsemide (DEMADEX) 20 MG tablet Take 20 mg by mouth daily.    traMADol  (ULTRAM ) 50 MG tablet Take 50 mg by mouth 2 (two) times daily.   traZODone  (DESYREL ) 50 MG tablet Take 25 mg by mouth at bedtime.    [DISCONTINUED] aspirin  EC 81 MG tablet Take 81 mg by mouth daily.   [DISCONTINUED] Cholecalciferol (VITAMIN D -3 PO) Take 1 capsule by mouth daily.   [DISCONTINUED] Cyanocobalamin  (VITAMIN B-12 PO) Take 1 tablet by mouth daily.   [DISCONTINUED] levETIRAcetam  (KEPPRA ) 500 MG tablet Take 1 tablet (500 mg total) by mouth 2 (two) times daily for 7 days.   [DISCONTINUED] nystatin  (MYCOSTATIN ) 100000 UNIT/ML suspension Take 5 mLs (500,000 Units total) by mouth 4 (four) times daily.   [DISCONTINUED] sodium chloride  (OCEAN) 0.65 % SOLN nasal spray Place 1 spray into both nostrils 2 (two) times daily as needed (sinus irritation).   No facility-administered encounter medications on file as of 11/27/2023.    Social History: Social History   Tobacco Use   Smoking status: Never   Smokeless tobacco: Never  Vaping Use   Vaping status: Never Used  Substance Use Topics   Alcohol use: Yes    Comment: occassional   Drug use: No  Family Medical History: Family History  Problem Relation Age of Onset   Heart failure Mother    Heart disease Mother    Arthritis Mother    Colon cancer Paternal Grandmother    Breast cancer Neg Hx     Physical Examination: Today's Vitals   11/27/23 1016  BP: 130/82  Weight: 126 lb 12.8 oz (57.5 kg)  PainSc: 2   PainLoc: Head   Body mass index is 24.76 kg/m.   General: Patient is well developed, well nourished, calm, collected, and in no apparent distress. Attention to examination is appropriate.  Psychiatric: Patient is non-anxious.  Head:  Pupils equal, round, and reactive to light.  ENT:  Oral mucosa appears well hydrated.  Neck:   Supple.    Respiratory: Patient is breathing without any difficulty.  Extremities: 2+ pitting edema in LLE  Vascular: Palpable dorsal pedal pulses.  Skin:   On exposed skin, there are no abnormal skin lesions.  NEUROLOGICAL:     Awake, alert, oriented to person, place, and time.  Speech is clear and fluent. Fund  of knowledge is appropriate.   Cranial Nerves: Pupils equal round and reactive to light.  Facial tone is symmetric.  Mild facial drop on the left.   Strength: Side Biceps Triceps Deltoid Interossei Grip Wrist Ext. Wrist Flex.  R 5 5 5 5 5 5 5   L 5 5 5 5 5 5 5    Side Iliopsoas Quads Hamstring PF DF EHL  R 5 5 5 5 5 5   L 5 5 5 5 5 5    Reflexes are 1+ and symmetric at the biceps, triceps, brachioradialis, patella and achilles.   Hoffman's is absent.  Clonus is not present.  Toes are down-going.  Bilateral upper and lower extremity sensation is intact to light touch.    Ambulates with a slowed gait with the assistance of a rollator.   There is a small hematoma on the occiput from where she hit her head yesterday  Medical Decision Making  Imaging: 11/17/23 CT head  FINDINGS:   BRAIN AND VENTRICLES: Decreased attenuation of the mixed attenuation right subdural hematoma, measuring up to 6 mm in thickness, unchanged. Stable 2-3 mm leftward midline shift. No evidence of acute infarct. No hydrocephalus.   ORBITS: No acute abnormality.   SINUSES: No acute abnormality.   SOFT TISSUES AND SKULL: Decreased size of the Right posterior convexity scalp hematoma. Redemonstrated chronic vertex soft tissue defect. No skull fracture.   IMPRESSION: 1. Stable mixed-attenuation right subdural hematoma measuring up to 6 mm with stable 23 mm leftward midline shift. 2. Decreased size of the right posterior convexity scalp hematoma.   Electronically signed by: Ryan Chess MD 11/17/2023 01:19 PM EST RP Workstation: [id number]  I have personally reviewed the images and agree with the above interpretation.  Assessment and Plan: Ms. Begay is a pleasant 78 y.o. female presenting today for hospital follow up of SDH. She continues to have trouble with her balance and experienced a fall yesterday. Her balance symptoms do not sound like they are syncopal or pre-syncopal. Given her history of  multiple falls and left facial droop on exam, I recommended getting an updated CT head to evaluate her SDH as well as an MRI of her brain. None of her symptoms seem to be acute so I did not recommend immediate evaluation in the ED today however I encourage this should her symptoms worsen or change. She requested I contact her via MyChart and telephone once her studies  are completed. Should they be largely normal, I will place a referral to neurology for further evaluation and treatment. I have also placed a referral to PT for gait training.  I have sent a message to GI about a sooner appointment given her persistent diarrhea.   Thank you for involving me in the care of this patient.   Edsel Goods Dept. of Neurosurgery

## 2023-11-24 ENCOUNTER — Ambulatory Visit

## 2023-11-24 VITALS — BP 110/60 | HR 97

## 2023-11-24 DIAGNOSIS — S0100XD Unspecified open wound of scalp, subsequent encounter: Secondary | ICD-10-CM

## 2023-11-24 DIAGNOSIS — Z09 Encounter for follow-up examination after completed treatment for conditions other than malignant neoplasm: Secondary | ICD-10-CM

## 2023-11-24 DIAGNOSIS — Z5189 Encounter for other specified aftercare: Secondary | ICD-10-CM

## 2023-11-24 NOTE — Progress Notes (Signed)
   Established Patient Office Visit  Subjective   Patient ID: Sheryl Kennedy, female    DOB: December 14, 1945  Age: 78 y.o. MRN: [id number]  Chief Complaint  Patient presents with   Follow-up    Cellulitis of head or scalp    HPI  Patient presents with a chronic wound at the vertex of the scalp status post Mohs surgery for squamous cell carcinoma. The wound continues to improve with local wound care. Patient had a fall and developed a decent sized hematoma on the right occipital scalp, was sent to the ER from our clinic and was found to have a a subdural hematoma. Non-operative management by ER was performed and follow up with neurosurgery was established. She also had an episode of chest pain a few days ago and was evaluated in the ER. Patient was discharged home after evaluation with follow up with cardiology. Presents today for follow up and wound check.    Objective:     BP 110/60   Pulse 97   SpO2 97%  BP Readings from Last 3 Encounters:  11/24/23 110/60  11/22/23 127/67  11/07/23 (!) 106/55      Physical Exam   No results found for any visits on 11/24/23.  On exam, her wound is approximately 2.5 x 2.25 cm. Exposed calvarium. Some granulation present. Slow advancing epithelialization. Surrounding skin is without erythema or induration otherwise concerning for infection. Area is nontender. Hematoma on right side of the posterior scalp still present but improving.     The ASCVD Risk score (Arnett DK, et al., 2019) failed to calculate for the following reasons:   Risk score cannot be calculated because patient has a medical history suggesting prior/existing ASCVD    Assessment & Plan:   Problem List Items Addressed This Visit   None Visit Diagnoses       Follow-up exam    -  Primary     Visit for wound check           We will start Vashe gel on the vertex twice daily and continue to monitor wound progression, as we wait for the hematoma of the scalp to improve.   Patient needs to follow-up with neurosurgery and cardiology.  Patient will follow-up with PA in 2 weeks and with me in 4 weeks to assess progression of the wound.  We will hold off on surgery for now until all this issues are addressed.  Patient and family expressed understanding and agree with the plan. Patient will follow-up next week with a PA for wound check. Patient will follow-up with me in 2 weeks for wound check as well.  If she develops any signs or symptoms of infection, instructed to go to the nearest emergency department.   Aveah Castell M Kallista Pae, MD

## 2023-11-27 ENCOUNTER — Other Ambulatory Visit: Payer: Self-pay

## 2023-11-27 ENCOUNTER — Encounter

## 2023-11-27 ENCOUNTER — Emergency Department

## 2023-11-27 ENCOUNTER — Inpatient Hospital Stay
Admission: EM | Admit: 2023-11-27 | Discharge: 2023-12-01 | DRG: 640 | Disposition: A | Discharge status: Home Health | Attending: Internal Medicine | Admitting: Internal Medicine

## 2023-11-27 ENCOUNTER — Ambulatory Visit: Admitting: Neurosurgery

## 2023-11-27 ENCOUNTER — Encounter: Payer: Self-pay | Admitting: Neurosurgery

## 2023-11-27 ENCOUNTER — Ambulatory Visit

## 2023-11-27 VITALS — BP 130/82 | Wt 126.8 lb

## 2023-11-27 DIAGNOSIS — C4442 Squamous cell carcinoma of skin of scalp and neck: Secondary | ICD-10-CM | POA: Diagnosis present

## 2023-11-27 DIAGNOSIS — Z961 Presence of intraocular lens: Secondary | ICD-10-CM | POA: Diagnosis present

## 2023-11-27 DIAGNOSIS — S066XAA Traumatic subarachnoid hemorrhage with loss of consciousness status unknown, initial encounter: Secondary | ICD-10-CM | POA: Diagnosis present

## 2023-11-27 DIAGNOSIS — Z8261 Family history of arthritis: Secondary | ICD-10-CM

## 2023-11-27 DIAGNOSIS — A0471 Enterocolitis due to Clostridium difficile, recurrent: Secondary | ICD-10-CM | POA: Diagnosis present

## 2023-11-27 DIAGNOSIS — R296 Repeated falls: Secondary | ICD-10-CM | POA: Diagnosis not present

## 2023-11-27 DIAGNOSIS — W19XXXA Unspecified fall, initial encounter: Secondary | ICD-10-CM

## 2023-11-27 DIAGNOSIS — I83893 Varicose veins of bilateral lower extremities with other complications: Secondary | ICD-10-CM | POA: Diagnosis present

## 2023-11-27 DIAGNOSIS — Z9842 Cataract extraction status, left eye: Secondary | ICD-10-CM

## 2023-11-27 DIAGNOSIS — S0003XA Contusion of scalp, initial encounter: Secondary | ICD-10-CM

## 2023-11-27 DIAGNOSIS — Y92009 Unspecified place in unspecified non-institutional (private) residence as the place of occurrence of the external cause: Secondary | ICD-10-CM

## 2023-11-27 DIAGNOSIS — R531 Weakness: Secondary | ICD-10-CM | POA: Diagnosis not present

## 2023-11-27 DIAGNOSIS — G8929 Other chronic pain: Secondary | ICD-10-CM | POA: Diagnosis present

## 2023-11-27 DIAGNOSIS — R627 Adult failure to thrive: Secondary | ICD-10-CM | POA: Diagnosis present

## 2023-11-27 DIAGNOSIS — S065XAA Traumatic subdural hemorrhage with loss of consciousness status unknown, initial encounter: Secondary | ICD-10-CM | POA: Diagnosis present

## 2023-11-27 DIAGNOSIS — E785 Hyperlipidemia, unspecified: Secondary | ICD-10-CM | POA: Diagnosis present

## 2023-11-27 DIAGNOSIS — Z91013 Allergy to seafood: Secondary | ICD-10-CM

## 2023-11-27 DIAGNOSIS — Z9181 History of falling: Secondary | ICD-10-CM

## 2023-11-27 DIAGNOSIS — M109 Gout, unspecified: Secondary | ICD-10-CM | POA: Diagnosis present

## 2023-11-27 DIAGNOSIS — Z96652 Presence of left artificial knee joint: Secondary | ICD-10-CM | POA: Diagnosis present

## 2023-11-27 DIAGNOSIS — Z85828 Personal history of other malignant neoplasm of skin: Secondary | ICD-10-CM

## 2023-11-27 DIAGNOSIS — I10 Essential (primary) hypertension: Secondary | ICD-10-CM | POA: Diagnosis present

## 2023-11-27 DIAGNOSIS — Z800 Family history of malignant neoplasm of digestive organs: Secondary | ICD-10-CM

## 2023-11-27 DIAGNOSIS — Z79890 Hormone replacement therapy (postmenopausal): Secondary | ICD-10-CM

## 2023-11-27 DIAGNOSIS — W1830XA Fall on same level, unspecified, initial encounter: Secondary | ICD-10-CM | POA: Diagnosis present

## 2023-11-27 DIAGNOSIS — R197 Diarrhea, unspecified: Secondary | ICD-10-CM | POA: Diagnosis present

## 2023-11-27 DIAGNOSIS — Z79899 Other long term (current) drug therapy: Secondary | ICD-10-CM

## 2023-11-27 DIAGNOSIS — Z9089 Acquired absence of other organs: Secondary | ICD-10-CM

## 2023-11-27 DIAGNOSIS — R200 Anesthesia of skin: Secondary | ICD-10-CM | POA: Diagnosis present

## 2023-11-27 DIAGNOSIS — Z888 Allergy status to other drugs, medicaments and biological substances status: Secondary | ICD-10-CM

## 2023-11-27 DIAGNOSIS — Z8249 Family history of ischemic heart disease and other diseases of the circulatory system: Secondary | ICD-10-CM

## 2023-11-27 DIAGNOSIS — E871 Hypo-osmolality and hyponatremia: Principal | ICD-10-CM | POA: Diagnosis present

## 2023-11-27 DIAGNOSIS — Z886 Allergy status to analgesic agent status: Secondary | ICD-10-CM

## 2023-11-27 DIAGNOSIS — A0472 Enterocolitis due to Clostridium difficile, not specified as recurrent: Secondary | ICD-10-CM

## 2023-11-27 DIAGNOSIS — Z882 Allergy status to sulfonamides status: Secondary | ICD-10-CM

## 2023-11-27 DIAGNOSIS — I130 Hypertensive heart and chronic kidney disease with heart failure and stage 1 through stage 4 chronic kidney disease, or unspecified chronic kidney disease: Secondary | ICD-10-CM | POA: Diagnosis present

## 2023-11-27 DIAGNOSIS — Z881 Allergy status to other antibiotic agents status: Secondary | ICD-10-CM

## 2023-11-27 DIAGNOSIS — Z9841 Cataract extraction status, right eye: Secondary | ICD-10-CM

## 2023-11-27 DIAGNOSIS — I5032 Chronic diastolic (congestive) heart failure: Secondary | ICD-10-CM | POA: Diagnosis present

## 2023-11-27 DIAGNOSIS — D72829 Elevated white blood cell count, unspecified: Secondary | ICD-10-CM | POA: Diagnosis present

## 2023-11-27 DIAGNOSIS — Y9301 Activity, walking, marching and hiking: Secondary | ICD-10-CM | POA: Diagnosis present

## 2023-11-27 DIAGNOSIS — Z59868 Other specified financial insecurity: Secondary | ICD-10-CM

## 2023-11-27 DIAGNOSIS — Y92090 Kitchen in other non-institutional residence as the place of occurrence of the external cause: Secondary | ICD-10-CM

## 2023-11-27 DIAGNOSIS — Z9889 Other specified postprocedural states: Secondary | ICD-10-CM

## 2023-11-27 DIAGNOSIS — M1990 Unspecified osteoarthritis, unspecified site: Secondary | ICD-10-CM | POA: Diagnosis present

## 2023-11-27 DIAGNOSIS — N1832 Chronic kidney disease, stage 3b: Secondary | ICD-10-CM | POA: Diagnosis present

## 2023-11-27 DIAGNOSIS — E039 Hypothyroidism, unspecified: Secondary | ICD-10-CM | POA: Diagnosis present

## 2023-11-27 LAB — URINALYSIS, ROUTINE W REFLEX MICROSCOPIC
Bilirubin Urine: NEGATIVE
Glucose, UA: NEGATIVE mg/dL
Hgb urine dipstick: NEGATIVE
Ketones, ur: NEGATIVE mg/dL
Leukocytes,Ua: NEGATIVE
Nitrite: NEGATIVE
Protein, ur: NEGATIVE mg/dL
Specific Gravity, Urine: 1.003 — ABNORMAL LOW (ref 1.005–1.030)
pH: 6 (ref 5.0–8.0)

## 2023-11-27 LAB — COMPREHENSIVE METABOLIC PANEL WITH GFR
ALT: 24 U/L (ref 0–44)
AST: 47 U/L — ABNORMAL HIGH (ref 15–41)
Albumin: 3.7 g/dL (ref 3.5–5.0)
Alkaline Phosphatase: 106 U/L (ref 38–126)
Anion gap: 8 (ref 5–15)
BUN: 13 mg/dL (ref 8–23)
CO2: 24 mmol/L (ref 22–32)
Calcium: 9.9 mg/dL (ref 8.9–10.3)
Chloride: 87 mmol/L — ABNORMAL LOW (ref 98–111)
Creatinine, Ser: 0.78 mg/dL (ref 0.44–1.00)
GFR, Estimated: >60 mL/min (ref >60)
Glucose, Bld: 110 mg/dL — ABNORMAL HIGH (ref 70–99)
Potassium: 4.9 mmol/L (ref 3.5–5.1)
Sodium: 119 mmol/L — CL (ref 135–145)
Total Bilirubin: 0.4 mg/dL (ref 0.0–1.2)
Total Protein: 5.9 g/dL — ABNORMAL LOW (ref 6.5–8.1)

## 2023-11-27 LAB — CBC
HCT: 34 % — ABNORMAL LOW (ref 36.0–46.0)
Hemoglobin: 11.8 g/dL — ABNORMAL LOW (ref 12.0–15.0)
MCH: 30.5 pg (ref 26.0–34.0)
MCHC: 34.7 g/dL (ref 30.0–36.0)
MCV: 87.9 fL (ref 80.0–100.0)
Platelets: 318 K/uL (ref 150–400)
RBC: 3.87 MIL/uL (ref 3.87–5.11)
RDW: 15.3 % (ref 11.5–15.5)
WBC: 14.5 K/uL — ABNORMAL HIGH (ref 4.0–10.5)
nRBC: 0 % (ref 0.0–0.2)

## 2023-11-27 LAB — OSMOLALITY: Osmolality: 248 mosm/kg — CL (ref 275–295)

## 2023-11-27 LAB — SODIUM, URINE, RANDOM: Sodium, Ur: <30 mmol/L

## 2023-11-27 LAB — MAGNESIUM: Magnesium: 1.3 mg/dL — ABNORMAL LOW (ref 1.7–2.4)

## 2023-11-27 LAB — PHOSPHORUS: Phosphorus: 2.7 mg/dL (ref 2.5–4.6)

## 2023-11-27 LAB — PRO BRAIN NATRIURETIC PEPTIDE: Pro Brain Natriuretic Peptide: 570 pg/mL — ABNORMAL HIGH (ref <300.0)

## 2023-11-27 LAB — OSMOLALITY, URINE: Osmolality, Ur: 155 mosm/kg — ABNORMAL LOW (ref 300–900)

## 2023-11-27 MED ORDER — HYDRALAZINE HCL 20 MG/ML IJ SOLN: 5.0000 mg | INTRAMUSCULAR | Status: DC | PRN

## 2023-11-27 MED ORDER — SODIUM CHLORIDE 3 % IV SOLN
INTRAVENOUS | Status: DC
  Filled 2023-11-27 (×2): qty 500

## 2023-11-27 MED ORDER — SODIUM CHLORIDE 0.9 % IV BOLUS
500.0000 mL | Freq: Once | INTRAVENOUS | Status: AC
  Administered 2023-11-27: 500 mL via INTRAVENOUS

## 2023-11-27 MED ORDER — SODIUM CHLORIDE 1 G PO TABS
1.0000 g | ORAL_TABLET | Freq: Two times a day (BID) | ORAL | Status: DC
  Administered 2023-11-27: 1 g via ORAL
  Filled 2023-11-27: qty 1

## 2023-11-27 MED ORDER — ACETAMINOPHEN 325 MG PO TABS
650.0000 mg | ORAL_TABLET | Freq: Four times a day (QID) | ORAL | Status: DC | PRN
  Administered 2023-11-27 – 2023-11-30 (×6): 650 mg via ORAL
  Filled 2023-11-27 (×6): qty 2

## 2023-11-27 MED ORDER — ONDANSETRON HCL 4 MG/2ML IJ SOLN: 4.0000 mg | Freq: Three times a day (TID) | INTRAMUSCULAR | Status: DC | PRN

## 2023-11-27 NOTE — ED Provider Notes (Signed)
 [facility] Provider Note    Event Date/Time   First MD Initiated Contact with Patient 11/27/23 1929     (approximate)   History   Abnormal Labs  Patient states she was sent over from [facility] for low sodium; complaining of generalized weakness.    HPI Sheryl Kennedy is a 78 y.o. female PMH CKD stage III, hypertension, hyperlipidemia, prior subdural hematoma presents for evaluation of generalized weakness, low sodium - Patient states she has been feeling somewhat more weak than usual over the past few days.  Did have a fall yesterday and hit her head, no LOC.  Not on thinners.  Has been feeling that her left leg is maybe dragging more than usual.  Did have left facial numbness that has since resolved. - Seen in neurosurgery clinic today, see summary below - Had recently discontinued some antihypertensives, not on hydrochlorothiazide - Has been on a sodium supplement in the past - No altered mentation - Does have issues with chronic diarrhea which is intermittent over the past 6 months.  Notably worse over the past week or so.  No abdominal pain.  No fevers.  No urinary symptoms. - Has had recent cough that is intermittent.  Per chart review, was seen in neurosurgery clinic earlier today.  Reportedly fell yesterday and noted tingling in the left side of her face and a sensation like she is dragging her left foot.  They recommended that she get an outpatient CT head and MRI brain given her subacute symptoms     Physical Exam   Triage Vital Signs: ED Triage Vitals  Encounter Vitals Group     BP 11/27/23 1715 125/81     Girls Systolic BP Percentile --      Girls Diastolic BP Percentile --      Boys Systolic BP Percentile --      Boys Diastolic BP Percentile --      Pulse Rate 11/27/23 1715 76     Resp 11/27/23 1715 18     Temp 11/27/23 1715 97.9 F (36.6 C)     Temp Source 11/27/23 1715 Oral     SpO2 11/27/23 1715 100 %     Weight 11/27/23 1712 126 lb  (57.2 kg)     Height 11/27/23 1712 5' (1.524 m)     Head Circumference --      Peak Flow --      Pain Score --      Pain Loc --      Pain Education --      Exclude from Growth Chart --     Most recent vital signs: Vitals:   11/27/23 2300 11/27/23 2330  BP: 126/67 (!) 101/56  Pulse: 77 75  Resp:    Temp:    SpO2: 100% 99%     General: Awake, no distress.  HEENT: Small hematoma to occiput, scalp bandaging from prior Mohs surgery in place.  No midline neck pain. CV:  Good peripheral perfusion. RRR, RP 2+ Resp:  Normal effort. CTAB Abd:  No distention. Nontender to deep palpation throughout Neuro:  Aox4, CN II-XII intact, FNF wnl, finger taps fast b/l, 5/5 strength in bilateral finger extension/grip, arm flexion/extension, EHL/FHL. BUE AG 10+ sec no drift, BLE AG 5+ sec no drift.  SI LT.  Gait testing deferred.    ED Results / Procedures / Treatments   Labs (all labs ordered are listed, but only abnormal results are displayed) Labs Reviewed  COMPREHENSIVE METABOLIC PANEL  WITH GFR - Abnormal; Notable for the following components:      Result Value   Sodium 119 (*)    Chloride 87 (*)    Glucose, Bld 110 (*)    Total Protein 5.9 (*)    AST 47 (*)    All other components within normal limits  CBC - Abnormal; Notable for the following components:   WBC 14.5 (*)    Hemoglobin 11.8 (*)    HCT 34.0 (*)    All other components within normal limits  URINALYSIS, ROUTINE W REFLEX MICROSCOPIC - Abnormal; Notable for the following components:   Color, Urine STRAW (*)    APPearance CLEAR (*)    Specific Gravity, Urine 1.003 (*)    All other components within normal limits  OSMOLALITY, URINE - Abnormal; Notable for the following components:   Osmolality, Ur 155 (*)    All other components within normal limits  OSMOLALITY - Abnormal; Notable for the following components:   Osmolality 248 (*)    All other components within normal limits  PRO BRAIN NATRIURETIC PEPTIDE - Abnormal;  Notable for the following components:   Pro Brain Natriuretic Peptide 570.0 (*)    All other components within normal limits  MAGNESIUM  - Abnormal; Notable for the following components:   Magnesium  1.3 (*)    All other components within normal limits  GASTROINTESTINAL PANEL BY PCR, STOOL (REPLACES STOOL CULTURE)  C DIFFICILE QUICK SCREEN W PCR REFLEX    OVA + PARASITE EXAM  SODIUM, URINE, RANDOM  PHOSPHORUS  SODIUM  SODIUM  SODIUM  CBC  PROTIME-INR  APTT  SODIUM  SODIUM  SODIUM  SODIUM  SODIUM  CBG MONITORING, ED     EKG  Ecg = sinus rhythm, rate 77, no gross ST elevation or depression, no significant repolarization abnormality, left axis deviation, normal intervals.  No clear evidence of ischemia no arrhythmia on my interpretation.   RADIOLOGY Radiology interpreted myself and radiology report reviewed.  No acute pathology identified.  Previously demonstrated subdural hematoma was decreasing in size.    PROCEDURES:  Critical Care performed: Yes, see critical care procedure note(s)  .Critical Care  Performed by: Clarine Ozell LABOR, MD Authorized by: Clarine Ozell LABOR, MD   Critical care provider statement:    Critical care time (minutes):  30   Critical care time was exclusive of:  Separately billable procedures and treating other patients   Critical care was necessary to treat or prevent imminent or life-threatening deterioration of the following conditions:  Metabolic crisis   Critical care was time spent personally by me on the following activities:  Development of treatment plan with patient or surrogate, discussions with consultants, evaluation of patient's response to treatment, examination of patient, ordering and review of laboratory studies, ordering and review of radiographic studies, ordering and performing treatments and interventions, pulse oximetry, re-evaluation of patient's condition and review of old charts   I assumed direction of critical care for this  patient from another provider in my specialty: no     Care discussed with: admitting provider      MEDICATIONS ORDERED IN ED: Medications  sodium chloride  tablet 1 g (1 g Oral Given 11/27/23 2213)  ondansetron  (ZOFRAN ) injection 4 mg (has no administration in time range)  hydrALAZINE  (APRESOLINE ) injection 5 mg (has no administration in time range)  acetaminophen  (TYLENOL ) tablet 650 mg (650 mg Oral Given 11/27/23 2216)  sodium chloride  (hypertonic) 3 % solution ( Intravenous New Bag/Given 11/27/23 2344)  sodium chloride   0.9 % bolus 500 mL (0 mLs Intravenous Stopped 11/27/23 2341)     IMPRESSION / MDM / ASSESSMENT AND PLAN / ED COURSE  I reviewed the triage vital signs and the nursing notes.                              DDX/MDM/AP: Differential diagnosis includes, but is not limited to, hyponatremia secondary to dehydration, consider SIADH in the setting of recent head injury, consider underlying pneumonia or UTI.  Do not suspect acute intra-abdominal pathology at this time given benign exam.  Consider recurrent head bleed from recent trauma, skull fracture, C-spine fracture.  Hyponatremia not clearly medication induced.  Plan: - Labs - Small bolus IV fluid, suspect hypovolemia contributing - CT head, CT C-spine - Chest x-ray - EKG - Anticipate admission  Patient's presentation is most consistent with acute presentation with potential threat to life or bodily function.  The patient is on the cardiac monitor to evaluate for evidence of arrhythmia and/or significant heart rate changes.  ED course below.  Treating severe hyponatremia with gentle IV fluid.  Mentating well.  No traumatic injuries identified.  Consider likely hypovolemia from recent diarrhea or possible SIADH.  Admitted to hospitalist service.  Clinical Course as of 11/28/23 0013  Mon Nov 27, 2023  2028 Labs with notable hyponatremia to 119, mildly dry on exam, endorses worsening of chronic diarrhea recently  Will  give small bolus IV fluid  CBC with leukocytosis to 14.5  Urinalysis with no evidence of infection [MM]  2116 CTH: IMPRESSION: 1. Small right subdural hematoma, 2 mm, slightly decreased from prior, without mass effect. 2. No new intracranial hemorrhage or acute infarct. 3. Persistent right posterior parietal scalp hematoma.   [MM]  2116 CTCspine: IMPRESSION: 1. Chronic appearing degenerative change of the cervical spine without acute abnormality.   [MM]  2116 CXR: IMPRESSION: 1. No acute cardiopulmonary abnormality.   [MM]    Clinical Course User Index [MM] Clarine Ozell LABOR, MD     FINAL CLINICAL IMPRESSION(S) / ED DIAGNOSES   Final diagnoses:  Hyponatremia  Fall, initial encounter  Hematoma of scalp, initial encounter     Rx / DC Orders   ED Discharge Orders     None        Note:  This document was prepared using Dragon voice recognition software and may include unintentional dictation errors.   Clarine Ozell LABOR, MD 11/28/23 [phone]

## 2023-11-27 NOTE — Patient Instructions (Addendum)
 I will send a message to [facility] and they should call you to get this scheduled.   If you haven't heard from them within a couple of days, please call them at [phone].

## 2023-11-27 NOTE — ED Triage Notes (Signed)
 Patient states she was sent over from [facility] for low sodium; complaining of generalized weakness.

## 2023-11-27 NOTE — H&P (Signed)
 History and Physical    Sheryl Kennedy FMW:[id number] DOB: May 21, 1945 DOA: 11/27/2023  Referring MD/NP/PA:   PCP: Sheryl Reyes [name], MD   Patient coming from:  The patient is coming from home.     Chief Complaint: weakness, fall, diarrhea,   HPI: Sheryl Kennedy is a 78 y.o. female with medical history significant of HTN, HLD, dCHF, hypothyroidism, gout, CKD-3B, chronic pain, hyponatremia, C. difficile colitis, varicose vein, skin cancer, SDH, who presents with weakness, fall, diarrhea.  Patient states that she has generalized weakness in the past 3 days.  She fell yesterday when she was walking in her kitchen, no LOC.  She injured the back of her head.  She has mild headache, no neck pain.  Patient states she had left facial numbness after fall which has resolved. Currently no unilateral numbness or tingling in extremities.  No facial droop or slurred speech.  Patient does not have chest pain, cough, SOB.  Patient states that she has chronic diarrhea for more than 6 months.  Her diarrhea has worsened in the past several days.  She has had 3 watery diarrhea this evening.  No nausea, vomiting, abdominal pain.  No fever or chills.  No symptoms of UTI. Pt has hx of SDH. She had follow-up with neurosurgery NP, Edsel Goods today, who recommended CT of head and MRI of brain.  Data reviewed independently and ED Course: pt was found to have WBC 14.5, GFR> 60, sodium 119, magnesium  1.3, potassium 4.9, phosphorus of 2.7.  Temperature normal, blood pressure 125/81, heart rate 76, RR 18, oxygen saturation 100% on room air.  Chest x-ray negative.  CT of C-spine negative for acute injury.  Patient is admitted to stepdown as inpatient.  CT of head: 1. Small right subdural hematoma, 2 mm, slightly decreased from prior, without mass effect. 2. No new intracranial hemorrhage or acute infarct. 3. Persistent right posterior parietal scalp hematoma.  MRI-brain: 1. Approximately 3 mm thick right cerebral  convexity subdural hematoma with small volume of adjacent frontal convexity subarachnoid hemorrhage. 2. No acute infarct.   EKG: I have personally reviewed.  Sinus rhythm, QTc 467, LAE, LAD, poor R wave progression.   Review of Systems:   General: no fevers, chills, no body weight gain, has fatigue HEENT: no blurry vision, hearing changes or sore throat Respiratory: no dyspnea, coughing, wheezing CV: no chest pain, no palpitations GI: no nausea, vomiting, abdominal pain, has diarrhea, no constipation GU: no dysuria, burning on urination, increased urinary frequency, hematuria  Ext: has leg edema Neuro: no vision change or hearing loss. Has fall and left facial numbness Skin: no rash, no skin tear. MSK: No muscle spasm, no deformity, no limitation of range of movement in spin Heme: No easy bruising.  Travel history: No recent long distant travel.   Allergy:  Allergies  Allergen Reactions   Ery-Tab [Erythromycin] Other (See Comments)    Unknown reaction   Fosamax [Alendronate] Other (See Comments)    Possible cause of hypercalcemia   Levaquin [Levofloxacin] Other (See Comments)     Dizziness    Aleve [Naproxen] Swelling and Other (See Comments)    Arthralgias  Swelling in hands/joints, also   Shellfish Allergy Swelling, Dermatitis and Other (See Comments)    Arthralgias Joint swelling, also Gout flares   Sudafed [Pseudoephedrine] Rash and Other (See Comments)    Rhinitis Sneezing   Sulfa Antibiotics Rash    Past Medical History:  Diagnosis Date   Arthritis    Cancer (HCC)  08/2012   skin right anterior lower leg, squamous cell   DSAP (disseminated superficial actinic porokeratosis)    Gout    Hx of seasonal allergies    Hyperlipidemia    Hypertension    Renal disorder    CKD stage 3   SCC (squamous cell carcinoma) 07/31/2023   superfical infiltration, R crown scalp, referral to Dr. 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 Left forearm. Excision 02/01/2023   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 complications     Past Surgical History:  Procedure Laterality Date   BACK SURGERY  2008   no metal in back   BREAST BIOPSY Right 09/13/2010   core/neg   BREAST BIOPSY Right 12/09/2015   us  biopsy/neg   BREAST SURGERY Right 2012   CATARACT EXTRACTION W/PHACO Left 04/25/2022   Procedure: CATARACT EXTRACTION PHACO AND INTRAOCULAR LENS PLACEMENT (IOC) LEFT 3.42 00:25.2;  Surgeon: Myrna Adine Anes, MD;  Location: [facility];  Service: Ophthalmology;  Laterality: Left;   CATARACT EXTRACTION W/PHACO Right 05/23/2022   Procedure: CATARACT EXTRACTION PHACO AND INTRAOCULAR LENS PLACEMENT (IOC) RIGHT 3.84 00:28.1;  Surgeon: Myrna Adine Anes, MD;  Location: [facility];  Service: Ophthalmology;  Laterality: Right;   CHONDROPLASTY Left 01/22/2016   Procedure: arthroscopic medial AND LATERAL CHONDROPLASTY;  Surgeon: Lynwood SHAUNNA Hue, MD;  Location: [facility];  Service: Orthopedics;  Laterality: Left;   COLONOSCOPY  2011   COLONOSCOPY WITH PROPOFOL  N/A 04/14/2015   Procedure: COLONOSCOPY WITH PROPOFOL ;  Surgeon: Gladis RAYMOND Mariner, MD;  Location: [facility];  Service: Endoscopy;  Laterality: N/A;   CYST REMOVAL HAND Left 2012   JOINT REPLACEMENT Right 09/30/2013   TOTAL KNEE REPLACEMENT, DR. HUE, [facility]   KNEE ARTHROSCOPY WITH MEDIAL MENISECTOMY Left 01/22/2016   Procedure: KNEE ARTHROSCOPY WITH MEDIAL AND LATERAL MENISECTOMY;  Surgeon: Lynwood SHAUNNA Hue, MD;  Location: [facility];  Service: Orthopedics;  Laterality: Left;   KNEE SURGERY  1997   LEFT HEART CATH AND CORONARY ANGIOGRAPHY N/A 10/25/2021   Procedure: LEFT HEART CATH AND CORONARY ANGIOGRAPHY;  Surgeon: Mady Bruckner, MD;  Location: [facility] INVAS[facility];  Service: Cardiovascular;  Laterality: N/A;   Stab  pheblectomy  Left 2013   TONSILLECTOMY     VEIN CLOSURE Bilateral 2012    Social History:  reports that she has never smoked. She has never used smokeless tobacco. She reports current alcohol use. She reports that she does not use drugs.  Family History:  Family History  Problem Relation Age of Onset   Heart failure Mother    Heart disease Mother    Arthritis Mother    Colon cancer Paternal Grandmother    Breast cancer Neg Hx      Prior to Admission medications   Medication Sig Start Date End Date Taking? Authorizing Provider  acetaminophen  (TYLENOL ) 500 MG tablet Take 1,000 mg by mouth 2 (two) times daily as needed for fever or headache (pain).    [provider]  allopurinol  (ZYLOPRIM ) 300 MG tablet Take 300 mg by mouth daily.    [provider]  feeding supplement (ENSURE PLUS HIGH PROTEIN) LIQD Take 237 mLs by mouth 2 (two) times daily between meals. 10/29/23   Barbarann Nest, MD  fexofenadine (ALLEGRA) 180 MG tablet Take 80 mg  by mouth at bedtime.    [provider]  fluconazole  (DIFLUCAN ) 100 MG tablet Take 1 tablet (100 mg total) by mouth daily. 11/09/23   Fayette Bodily, MD  ketoconazole  (NIZORAL ) 2 % shampoo WASH SCALP 2-3 TIMES WEEKLY, LET SIT 5 MINUTES AND RINSE Patient taking differently: Apply 1 Application topically See admin instructions. Apply topically to scalp up to twice a week as needed for scalp irritation. 08/01/22   Jackquline Sawyer, MD  levothyroxine  (SYNTHROID ) 50 MCG tablet Take 50 mcg by mouth daily before breakfast.    [provider]  metoprolol  succinate (TOPROL -XL) 25 MG 24 hr tablet Take 25 mg by mouth daily.  04/26/13   [provider]  Multiple Vitamins-Minerals (CENTRUM SILVER WOMEN 50+) TABS Take 1 tablet by mouth daily.    [provider]  potassium chloride  SA (K-DUR,KLOR-CON ) 20 MEQ tablet Take 20 mEq by mouth 3 (three) times daily.  03/28/13   [provider]  saccharomyces boulardii  (FLORASTOR) 250 MG capsule Take 1 capsule (250 mg total) by mouth 2 (two) times daily. 10/29/23   Barbarann Nest, MD  simvastatin  (ZOCOR ) 20 MG tablet Take 20 mg by mouth at bedtime.    [provider]  spironolactone-hydrochlorothiazide (ALDACTAZIDE) 25-25 MG tablet Take 1 tablet by mouth daily.    [provider]  torsemide (DEMADEX) 20 MG tablet Take 20 mg by mouth daily.  04/26/13   [provider]  traMADol  (ULTRAM ) 50 MG tablet Take 50 mg by mouth 2 (two) times daily.    [provider]  traZODone  (DESYREL ) 50 MG tablet Take 25 mg by mouth at bedtime.    [provider]    Physical Exam: Vitals:   11/27/23 1715 11/27/23 2218 11/27/23 2300 11/27/23 2330  BP: 125/81  126/67 (!) 101/56  Pulse: 76  77 75  Resp: 18     Temp: 97.9 F (36.6 C) 98 F (36.7 C)    TempSrc: Oral Oral    SpO2: 100%  100% 99%  Weight:      Height:       General: Not in acute distress.  HEENT:       Eyes: PERRL, EOMI, no jaundice       ENT: No discharge from the ears and nose, no pharynx injection, no tonsillar enlargement.        Neck: No JVD, no bruit, no mass felt. Heme: No neck lymph node enlargement. Cardiac: S1/S2, RRR, No murmurs, No gallops or rubs. Respiratory: No rales, wheezing, rhonchi or rubs. GI: Soft, nondistended, nontender, no rebound pain, no organomegaly, BS present. GU: No hematuria Ext: has 1+ leg edema bilaterally, has varicose vein, 1+DP/PT pulse bilaterally. Musculoskeletal: No joint deformities, No joint redness or warmth, no limitation of ROM in spin. Skin: No rashes.  Neuro: Alert, oriented X3, cranial nerves II-XII grossly intact, moves all extremities normally. Muscle strength 5/5 in all extremities, sensation to light touch intact.  Psych: Patient is not psychotic, no suicidal or hemocidal ideation.  Labs on Admission: I have personally reviewed following labs and imaging studies  CBC: Recent Labs  Lab 11/22/23 1943  11/27/23 1716  WBC 12.4* 14.5*  NEUTROABS 10.0*  --   HGB 12.1 11.8*  HCT 36.3 34.0*  MCV 91.2 87.9  PLT 323 318   Basic Metabolic Panel: Recent Labs  Lab 11/22/23 1943 11/27/23 1716  NA 130* 119*  K 4.3 4.9  CL 89* 87*  CO2 27 24  GLUCOSE 102* 110*  BUN 31*  13  CREATININE 1.22* 0.78  CALCIUM 10.9* 9.9  MG  --  1.3*  PHOS  --  2.7   GFR: Estimated Creatinine Clearance: 46.7 mL/min (by C-G formula based on SCr of 0.78 mg/dL). Liver Function Tests: Recent Labs  Lab 11/22/23 1943 11/27/23 1716  AST 38 47*  ALT 18 24  ALKPHOS 95 106  BILITOT 0.7 0.4  PROT 6.6 5.9*  ALBUMIN 4.1 3.7   No results for input(s): LIPASE, AMYLASE in the last 168 hours. No results for input(s): AMMONIA in the last 168 hours. Coagulation Profile: Recent Labs  Lab 11/22/23 1943  INR 1.1   Cardiac Enzymes: No results for input(s): CKTOTAL, CKMB, CKMBINDEX, TROPONINI in the last 168 hours. BNP (last 3 results) Recent Labs    11/27/23 1716  PROBNP 570.0*   HbA1C: No results for input(s): HGBA1C in the last 72 hours. CBG: No results for input(s): GLUCAP in the last 168 hours. Lipid Profile: No results for input(s): CHOL, HDL, LDLCALC, TRIG, CHOLHDL, LDLDIRECT in the last 72 hours. Thyroid Function Tests: No results for input(s): TSH, T4TOTAL, FREET4, T3FREE, THYROIDAB in the last 72 hours. Anemia Panel: No results for input(s): VITAMINB12, FOLATE, FERRITIN, TIBC, IRON, RETICCTPCT in the last 72 hours. Urine analysis:    Component Value Date/Time   COLORURINE STRAW (A) 11/27/2023 1716   APPEARANCEUR CLEAR (A) 11/27/2023 1716   APPEARANCEUR Clear 09/18/2013 0919   LABSPEC 1.003 (L) 11/27/2023 1716   LABSPEC 1.006 09/18/2013 0919   PHURINE 6.0 11/27/2023 1716   GLUCOSEU NEGATIVE 11/27/2023 1716   GLUCOSEU Negative 09/18/2013 0919   HGBUR NEGATIVE 11/27/2023 1716   BILIRUBINUR NEGATIVE 11/27/2023 1716   BILIRUBINUR Negative  09/18/2013 0919   KETONESUR NEGATIVE 11/27/2023 1716   PROTEINUR NEGATIVE 11/27/2023 1716   NITRITE NEGATIVE 11/27/2023 1716   LEUKOCYTESUR NEGATIVE 11/27/2023 1716   LEUKOCYTESUR Trace 09/18/2013 0919   Sepsis Labs: @LABRCNTIP (procalcitonin:4,lacticidven:4) )No results found for this or any previous visit (from the past 240 hours).   Radiological Exams on Admission:   Assessment/Plan Principal Problem:   Hyponatremia Active Problems:   Hypomagnesemia   Fall at home, initial encounter   Left facial numbness   SDH (subdural hematoma) (HCC)   Essential hypertension   Chronic diastolic CHF (congestive heart failure) (HCC)   Diarrhea   Leukocytosis   Gout   CKD stage 3b, GFR 30-44 ml/min (HCC)   Hyperlipidemia   Chronic pain   Hypothyroidism   Assessment and Plan:   Hyponatremia: Sodium 119.  Patient is alert oriented x 3.  Differential diagnosis include GI loss secondary to diarrhea, poor oral intake, dehydration, and SIADH  -Will admit to SDU bed as inpt - start 3% sodium chloride  with pharmacist consult - Will check urine sodium, urine osmolality, serum osmolality. - Fluid restriction - IVF: 500 mL NS in ED - Sodium chloride  tablet 1 g twice daily - f/u by frequent BMP - hold torsemide and spironolactone-HCTZ (patient is not taking these medications currently. - avoid over correction too fast due to risk of central pontine myelinolysis  Hypomagnesemia: Potassium 5.3.  Normal potassium and phosphorus - Repleted magnesium  with 1 g magnesium  sulfate IV  Fall at home, initial encounter: -fall precaution - PT/OT  Left facial numbness: MRI negative for stroke. - Observe closely  SDH (subdural hematoma) (HCC): CT of head showed as small right subdural hematoma, 2 mm, slightly decreased from prior, without mass effect. MRI-brain showed approximately 3 mm thick right cerebral convexity subdural hematoma with small volume of  adjacent frontal convexity subarachnoid  hemorrhage. -hold ASA  Essential hypertension -IV hydralazine  as needed - Metoprolol   Chronic diastolic CHF (congestive heart failure) (HCC): 2D echo on 10/25/2021 showed EF of 55-60% with grade 2 diastolic dysfunction.  Patient has leg edema, but no SOB or JVD, does not seem to have CHF exacerbation. - Check BMP -Hold diuretics due to diarrhea and hyponatremia  Diarrhea: Etiology is not clear.  Patient has history of C. difficile. -Check C. difficile, GI pathogen panel, ova parasite  Leukocytosis: WBC 14.5, no fever. -Follow up C. Difficile  Gout -Prerenal  CKD stage 3b, GFR 30-44 ml/min (HCC): Renal function stable, GFR> 60 today -Follow-up BMP  Hyperlipidemia -Zocor   Chronic pain -Continue home as needed tramadol   Hypothyroidism Synthroid     DVT ppx: SCD  Code Status: Full code    Family Communication:     Yes, patient's husband, son and the daughter at bed side.      Disposition Plan:  Anticipate discharge back to previous environment  Consults called: None   Admission status and Level of care: Stepdown: as inpt        Dispo: The patient is from: Home              Anticipated d/c is to: Home              Anticipated d/c date is: 2 days              Patient currently is not medically stable to d/c.    Severity of Illness:  The appropriate patient status for this patient is INPATIENT. Inpatient status is judged to be reasonable and necessary in order to provide the required intensity of service to ensure the patient's safety. The patient's presenting symptoms, physical exam findings, and initial radiographic and laboratory data in the context of their chronic comorbidities is felt to place them at high risk for further clinical deterioration. Furthermore, it is not anticipated that the patient will be medically stable for discharge from the hospital within 2 midnights of admission.   * I certify that at the point of admission it is my clinical judgment  that the patient will require inpatient hospital care spanning beyond 2 midnights from the point of admission due to high intensity of service, high risk for further deterioration and high frequency of surveillance required.*       Date of Service 11/28/2023    Caleb Exon [facility]   If 7PM-7AM, please contact night-coverage [email or web address] 11/28/2023, 1:36 AM

## 2023-11-28 ENCOUNTER — Inpatient Hospital Stay

## 2023-11-28 ENCOUNTER — Telehealth: Payer: Self-pay

## 2023-11-28 ENCOUNTER — Other Ambulatory Visit (HOSPITAL_COMMUNITY): Payer: Self-pay

## 2023-11-28 ENCOUNTER — Telehealth (HOSPITAL_COMMUNITY): Payer: Self-pay

## 2023-11-28 DIAGNOSIS — S065XAD Traumatic subdural hemorrhage with loss of consciousness status unknown, subsequent encounter: Secondary | ICD-10-CM

## 2023-11-28 DIAGNOSIS — A0471 Enterocolitis due to Clostridium difficile, recurrent: Secondary | ICD-10-CM

## 2023-11-28 DIAGNOSIS — R627 Adult failure to thrive: Secondary | ICD-10-CM | POA: Diagnosis not present

## 2023-11-28 DIAGNOSIS — S065XAA Traumatic subdural hemorrhage with loss of consciousness status unknown, initial encounter: Secondary | ICD-10-CM | POA: Diagnosis not present

## 2023-11-28 DIAGNOSIS — E871 Hypo-osmolality and hyponatremia: Secondary | ICD-10-CM | POA: Diagnosis not present

## 2023-11-28 DIAGNOSIS — W19XXXA Unspecified fall, initial encounter: Secondary | ICD-10-CM | POA: Diagnosis not present

## 2023-11-28 LAB — CBC
HCT: 34.7 % — ABNORMAL LOW (ref 36.0–46.0)
Hemoglobin: 11.7 g/dL — ABNORMAL LOW (ref 12.0–15.0)
MCH: 30 pg (ref 26.0–34.0)
MCHC: 33.7 g/dL (ref 30.0–36.0)
MCV: 89 fL (ref 80.0–100.0)
Platelets: 332 K/uL (ref 150–400)
RBC: 3.9 MIL/uL (ref 3.87–5.11)
RDW: 15.5 % (ref 11.5–15.5)
WBC: 9.4 K/uL (ref 4.0–10.5)
nRBC: 0 % (ref 0.0–0.2)

## 2023-11-28 LAB — GASTROINTESTINAL PANEL BY PCR, STOOL (REPLACES STOOL CULTURE)

## 2023-11-28 LAB — C DIFFICILE QUICK SCREEN W PCR REFLEX
C Diff antigen: POSITIVE — AB
C Diff interpretation: DETECTED
C Diff toxin: POSITIVE — AB

## 2023-11-28 LAB — BASIC METABOLIC PANEL WITH GFR
Anion gap: 10 (ref 5–15)
BUN: 8 mg/dL (ref 8–23)
CO2: 25 mmol/L (ref 22–32)
Calcium: 9.7 mg/dL (ref 8.9–10.3)
Chloride: 98 mmol/L (ref 98–111)
Creatinine, Ser: 0.78 mg/dL (ref 0.44–1.00)
GFR, Estimated: >60 mL/min (ref >60)
Glucose, Bld: 120 mg/dL — ABNORMAL HIGH (ref 70–99)
Potassium: 3.7 mmol/L (ref 3.5–5.1)
Sodium: 133 mmol/L — ABNORMAL LOW (ref 135–145)

## 2023-11-28 LAB — APTT: aPTT: <22 s — ABNORMAL LOW (ref 24–36)

## 2023-11-28 LAB — IRON AND TIBC
Iron: 28 ug/dL (ref 28–170)
Saturation Ratios: 10 % — ABNORMAL LOW (ref 10.4–31.8)
TIBC: 277 ug/dL (ref 250–450)
UIBC: 249 ug/dL

## 2023-11-28 LAB — SODIUM
Sodium: 126 mmol/L — ABNORMAL LOW (ref 135–145)
Sodium: 133 mmol/L — ABNORMAL LOW (ref 135–145)
Sodium: 131 mmol/L — ABNORMAL LOW (ref 135–145)
Sodium: 130 mmol/L — ABNORMAL LOW (ref 135–145)
Sodium: 128 mmol/L — ABNORMAL LOW (ref 135–145)

## 2023-11-28 LAB — PROTIME-INR
INR: 1 (ref 0.8–1.2)
Prothrombin Time: 14 s (ref 11.4–15.2)

## 2023-11-28 LAB — MRSA NEXT GEN BY PCR, NASAL: MRSA by PCR Next Gen: NOT DETECTED

## 2023-11-28 LAB — VITAMIN D 25 HYDROXY (VIT D DEFICIENCY, FRACTURES): Vit D, 25-Hydroxy: 71.26 ng/mL (ref 30–100)

## 2023-11-28 LAB — MAGNESIUM: Magnesium: 1.9 mg/dL (ref 1.7–2.4)

## 2023-11-28 LAB — FOLATE: Folate: >20 ng/mL (ref >5.9)

## 2023-11-28 LAB — GLUCOSE, CAPILLARY: Glucose-Capillary: 77 mg/dL (ref 70–99)

## 2023-11-28 LAB — PHOSPHORUS: Phosphorus: 2.6 mg/dL (ref 2.5–4.6)

## 2023-11-28 MED ORDER — TRAMADOL HCL 50 MG PO TABS
50.0000 mg | ORAL_TABLET | Freq: Two times a day (BID) | ORAL | Status: DC | PRN
  Administered 2023-11-28: 50 mg via ORAL
  Filled 2023-11-28: qty 1

## 2023-11-28 MED ORDER — SODIUM CHLORIDE 1 G PO TABS
2.0000 g | ORAL_TABLET | Freq: Once | ORAL | Status: AC
  Administered 2023-11-29: 2 g via ORAL
  Filled 2023-11-28: qty 2

## 2023-11-28 MED ORDER — ADULT MULTIVITAMIN W/MINERALS CH
1.0000 | ORAL_TABLET | Freq: Every day | ORAL | Status: DC
  Administered 2023-11-28 – 2023-12-01 (×4): 1 via ORAL
  Filled 2023-11-28 (×4): qty 1

## 2023-11-28 MED ORDER — MAGNESIUM SULFATE 2 GM/50ML IV SOLN
2.0000 g | Freq: Once | INTRAVENOUS | Status: AC
  Administered 2023-11-28: 2 g via INTRAVENOUS
  Filled 2023-11-28: qty 50

## 2023-11-28 MED ORDER — ALLOPURINOL 100 MG PO TABS
300.0000 mg | ORAL_TABLET | Freq: Every day | ORAL | Status: DC
  Administered 2023-11-28 – 2023-12-01 (×4): 300 mg via ORAL
  Filled 2023-11-28: qty 3
  Filled 2023-11-28 (×2): qty 1
  Filled 2023-11-28: qty 3
  Filled 2023-11-28: qty 1

## 2023-11-28 MED ORDER — LEVOTHYROXINE SODIUM 50 MCG PO TABS
50.0000 ug | ORAL_TABLET | Freq: Every day | ORAL | Status: DC
  Administered 2023-11-28 – 2023-12-01 (×4): 50 ug via ORAL
  Filled 2023-11-28 (×4): qty 1

## 2023-11-28 MED ORDER — FIDAXOMICIN 200 MG PO TABS
200.0000 mg | ORAL_TABLET | Freq: Two times a day (BID) | ORAL | Status: DC
  Administered 2023-11-28 – 2023-11-29 (×3): 200 mg via ORAL
  Filled 2023-11-28 (×3): qty 1

## 2023-11-28 MED ORDER — CHLORHEXIDINE GLUCONATE CLOTH 2 % EX PADS
6.0000 | MEDICATED_PAD | Freq: Every day | CUTANEOUS | Status: DC
  Administered 2023-11-28 – 2023-11-30 (×3): 6 via TOPICAL

## 2023-11-28 MED ORDER — METOPROLOL SUCCINATE ER 25 MG PO TB24
25.0000 mg | ORAL_TABLET | Freq: Every day | ORAL | Status: DC
  Administered 2023-11-28 – 2023-12-01 (×4): 25 mg via ORAL
  Filled 2023-11-28 (×4): qty 1

## 2023-11-28 MED ORDER — TRAZODONE HCL 50 MG PO TABS
25.0000 mg | ORAL_TABLET | Freq: Every evening | ORAL | Status: DC | PRN
Start: 2023-11-28 — End: 2023-12-01
  Administered 2023-12-01: 25 mg via ORAL
  Filled 2023-11-28: qty 1

## 2023-11-28 MED ORDER — GUAIFENESIN-DM 100-10 MG/5ML PO SYRP
5.0000 mL | ORAL_SOLUTION | ORAL | Status: DC | PRN
Start: 2023-11-28 — End: 2023-12-01
  Administered 2023-11-28 – 2023-11-30 (×3): 5 mL via ORAL
  Filled 2023-11-28 (×3): qty 10

## 2023-11-28 MED ORDER — SACCHAROMYCES BOULARDII 250 MG PO CAPS
250.0000 mg | ORAL_CAPSULE | Freq: Two times a day (BID) | ORAL | Status: DC
  Administered 2023-11-28 – 2023-12-01 (×7): 250 mg via ORAL
  Filled 2023-11-28 (×9): qty 1

## 2023-11-28 MED ORDER — PHENOL 1.4 % MT LIQD
1.0000 | OROMUCOSAL | Status: DC | PRN
  Administered 2023-11-29: 1 via OROMUCOSAL
  Filled 2023-11-28: qty 177

## 2023-11-28 MED ORDER — SIMVASTATIN 20 MG PO TABS
20.0000 mg | ORAL_TABLET | Freq: Every day | ORAL | Status: DC
  Administered 2023-11-28 – 2023-11-30 (×3): 20 mg via ORAL
  Filled 2023-11-28 (×3): qty 1

## 2023-11-28 NOTE — Progress Notes (Signed)
 Orthostatic vitals   11/28/23 0957  Vitals  BP Location Right Arm  BP Method Automatic  Patient Position (if appropriate) Orthostatic Vitals  Pulse Rate 87  ECG Heart Rate 88  Resp 16  Level of Consciousness  Level of Consciousness Alert  MEWS COLOR  MEWS Score Color Green  Orthostatic Lying   BP- Lying 108/79  Pulse- Lying 79  Orthostatic Sitting  BP- Sitting 122/88  Pulse- Sitting 85  Orthostatic Standing at 0 minutes  BP- Standing at 0 minutes 121/74  Pulse- Standing at 0 minutes 96  Orthostatic Standing at 3 minutes  BP- Standing at 3 minutes 116/70  Pulse- Standing at 3 minutes 100  Oxygen Therapy  SpO2 100 %  MEWS Score  MEWS Temp 0  MEWS Systolic 0  MEWS Pulse 0  MEWS RR 0  MEWS LOC 0  MEWS Score 0

## 2023-11-28 NOTE — Consult Note (Signed)
 Consulting Department:  Inpatient medicine  Primary Physician:  Auston Reyes BIRCH, MD  Chief Complaint: Subdural hematoma  History of Present Illness: 11/28/2023 Sheryl Kennedy is a 78 y.o. female who presents with the chief complaint of subdural hematoma.  She was recently seen outpatient.  She was sent for imaging, but she was concerned so went to the emergency department to have expedited imaging.  They found a subdural hematoma and that she was failing to thrive so she was admitted to the hospital.  This morning she does feel like her headache is better.  She is wearing a head dressing which is because of a previous skin infection that is nonhealing.  She has no new focal neurologic deficits.  Her memory she feels like is at her baseline.  She has not had any new seizure-like episodes.  She did get some facial numbness after striking her face on a fall.   Review of Systems:  A 10 point review of systems is negative, except for the pertinent positives and negatives detailed in the HPI.  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 Left forearm. Excision 02/01/2023   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 complications     Past Surgical History: Past Surgical History:  Procedure Laterality Date   BACK SURGERY  2008   no metal in back   BREAST BIOPSY Right 09/13/2010   core/neg   BREAST BIOPSY Right  12/09/2015   us  biopsy/neg   BREAST SURGERY Right 2012   CATARACT EXTRACTION W/PHACO Left 04/25/2022   Procedure: CATARACT EXTRACTION PHACO AND INTRAOCULAR LENS PLACEMENT (IOC) LEFT 3.42 00:25.2;  Surgeon: Myrna Adine Anes, MD;  Location: [facility];  Service: Ophthalmology;  Laterality: Left;   CATARACT EXTRACTION W/PHACO Right 05/23/2022   Procedure: CATARACT EXTRACTION PHACO AND INTRAOCULAR LENS PLACEMENT (IOC) RIGHT 3.84 00:28.1;  Surgeon: Myrna Adine Anes, MD;  Location: [facility];  Service: Ophthalmology;  Laterality: Right;   CHONDROPLASTY Left 01/22/2016   Procedure: arthroscopic medial AND LATERAL CHONDROPLASTY;  Surgeon: Lynwood SHAUNNA Hue, MD;  Location: [facility];  Service: Orthopedics;  Laterality: Left;   COLONOSCOPY  2011   COLONOSCOPY WITH PROPOFOL  N/A 04/14/2015   Procedure: COLONOSCOPY WITH PROPOFOL ;  Surgeon: Gladis RAYMOND Mariner, MD;  Location: [facility];  Service: Endoscopy;  Laterality: N/A;   CYST REMOVAL HAND Left 2012   JOINT REPLACEMENT Right 09/30/2013   TOTAL KNEE REPLACEMENT, DR. HUE, [facility]   KNEE ARTHROSCOPY WITH MEDIAL MENISECTOMY Left 01/22/2016   Procedure: KNEE ARTHROSCOPY WITH MEDIAL AND LATERAL MENISECTOMY;  Surgeon: Lynwood SHAUNNA Hue, MD;  Location: [facility];  Service: Orthopedics;  Laterality: Left;   KNEE SURGERY  1997   LEFT HEART CATH AND CORONARY ANGIOGRAPHY N/A 10/25/2021   Procedure: LEFT HEART CATH AND CORONARY ANGIOGRAPHY;  Surgeon: Mady Bruckner, MD;  Location: [facility] INV[facility];  Service: Cardiovascular;  Laterality: N/A;   Stab pheblectomy  Left 2013   TONSILLECTOMY     VEIN CLOSURE Bilateral 2012    Allergies: Allergies as of 11/27/2023 - Review Complete 11/27/2023  Allergen Reaction Noted   Ery-tab [erythromycin] Other (See Comments) 07/13/2021   Fosamax [alendronate] Other (See Comments)    Levaquin [levofloxacin] Other (See Comments) 01/10/2019   Aleve [naproxen] Swelling and Other (See Comments) 06/12/2014    Shellfish allergy Swelling, Dermatitis, and Other (See Comments) 09/17/2012   Sudafed [pseudoephedrine] Rash and Other (See Comments) 05/02/2013   Sulfa antibiotics Rash 06/01/2012    Medications:  Current Facility-Administered Medications:    acetaminophen  (TYLENOL ) tablet 650 mg, 650 mg, Oral, Q6H PRN, Niu, Xilin, MD, 650 mg at 11/27/23 2216   allopurinol  (ZYLOPRIM ) tablet 300 mg, 300 mg, Oral, Daily, Niu, Xilin, MD   hydrALAZINE  (APRESOLINE ) injection 5 mg, 5 mg, Intravenous, Q2H PRN, Niu, Xilin, MD   levothyroxine  (SYNTHROID ) tablet 50 mcg, 50 mcg, Oral, Q0600, Niu, Xilin, MD, 50 mcg at 11/28/23 9295   metoprolol  succinate (TOPROL -XL) 24 hr tablet 25 mg, 25 mg, Oral, Daily, Von Bellis, MD   multivitamin with minerals tablet 1 tablet, 1 tablet, Oral, Daily, Niu, Xilin, MD   ondansetron  (ZOFRAN ) injection 4 mg, 4 mg, Intravenous, Q8H PRN, Niu, Xilin, MD   saccharomyces boulardii (FLORASTOR) capsule 250 mg, 250 mg, Oral, BID, Niu, Xilin, MD   simvastatin  (ZOCOR ) tablet 20 mg, 20 mg, Oral, QHS, Niu, Xilin, MD   sodium chloride  (hypertonic) 3 % solution, , Intravenous, Continuous, Niu, Xilin, MD, Last Rate: 50 mL/hr at 11/28/23 0813, Infusion Verify at 11/28/23 0813   sodium chloride  tablet 1 g, 1 g, Oral, BID WC, Niu, Xilin, MD, 1 g at 11/27/23 2213   traMADol  (ULTRAM ) tablet 50 mg, 50 mg, Oral, Q12H PRN, Niu, Xilin, MD   traZODone  (DESYREL ) tablet 25 mg, 25 mg, Oral, QHS PRN, Niu, Xilin, MD   Social History: Social History   Tobacco Use   Smoking status: Never   Smokeless tobacco: Never  Vaping Use   Vaping status: Never Used  Substance Use Topics   Alcohol use: Yes    Comment: occassional   Drug use: No    Family Medical History: Family History  Problem Relation Age of Onset   Heart failure Mother    Heart disease Mother    Arthritis Mother    Colon cancer Paternal Grandmother    Breast cancer Neg Hx     Physical Examination: Vitals:   11/28/23 0600 11/28/23  0800  BP: (!) 121/53 (!) 111/57  Pulse: 73 75  Resp: 17 17  Temp:    SpO2: 97% 100%     General: Patient is well developed, well nourished, calm, collected, and in no apparent distress.  NEUROLOGICAL:  General: In no acute distress.   Awake, alert, oriented to person, place, and time.  Pupils equal round and reactive to light.  Full Facial tone is symmetric with activation, maybe some mild resting face weakness.  Tongue protrusion is midline.  Bilateral upper extremities are full strength proximally and distally.  There is no pronator drift.  Language is conversant  Imaging: MR BRAIN WO CONTRAST Result Date: 11/28/2023 EXAM: MRI Brain Without Contrast 11/28/2023 12:58:52 AM TECHNIQUE: Multiplanar multisequence MRI of the head/brain was performed without the administration of intravenous contrast. COMPARISON: CT head 11/27/2023 CLINICAL HISTORY: Transient ischemic attack (TIA) FINDINGS: BRAIN AND VENTRICLES: No acute infarct. Approximately 3 mm thick  right cerebral convexity subdural hematoma with small volume of adjacent frontal convexity subarachnoid hemorrhage. No mass. No significant midline shift. No hydrocephalus. The sella is unremarkable. Normal flow voids. ORBITS: No acute abnormality. SINUSES AND MASTOIDS: No acute abnormality. BONES AND SOFT TISSUES: Normal marrow signal. Right posterior scalp fluid, presumably hematoma. IMPRESSION: 1. Approximately 3 mm thick right cerebral convexity subdural hematoma with small volume of adjacent frontal convexity subarachnoid hemorrhage. 2. No acute infarct. Electronically signed by: Gilmore Molt MD 11/28/2023 01:03 AM EST RP Workstation: [id number]   CT Cervical Spine Wo Contrast Result Date: 11/27/2023 EXAM: CT CERVICAL SPINE WITHOUT CONTRAST 11/27/2023 08:59:18 PM TECHNIQUE: CT of the cervical spine was performed without the administration of intravenous contrast. Multiplanar reformatted images are provided for review. Automated exposure  control, iterative reconstruction, and/or weight based adjustment of the mA/kV was utilized to reduce the radiation dose to as low as reasonably achievable. COMPARISON: Comparison from 11/07/2023. CLINICAL HISTORY: Generalized weakness. FINDINGS: CERVICAL SPINE: BONES AND ALIGNMENT: 7 cervical segments are well visualized. Mild anterolisthesis of C3 on C4 is noted. No acute fracture or acute facet abnormality is noted. DEGENERATIVE CHANGES: Multilevel disc space narrowing is noted throughout the cervical spine. Osteophytic changes and facet hypertrophic changes are seen. SOFT TISSUES: The surrounding soft tissue structures are unremarkable. LUNG APICES: The lung apices show no acute abnormality. IMPRESSION: 1. Chronic appearing degenerative change of the cervical spine without acute abnormality. Electronically signed by: Oneil Devonshire MD 11/27/2023 09:05 PM EST RP Workstation: [id number]   CT HEAD WO CONTRAST ( ) Result Date: 11/27/2023 EXAM: CT HEAD WITHOUT CONTRAST 11/27/2023 08:59:18 PM TECHNIQUE: CT of the head was performed without the administration of intravenous contrast. Automated exposure control, iterative reconstruction, and/or weight based adjustment of the mA/kV was utilized to reduce the radiation dose to as low as reasonably achievable. COMPARISON: 11/17/2023 CLINICAL HISTORY: Weakness, recent SDH. FINDINGS: BRAIN AND VENTRICLES: No new focal hemorrhage is seen. There remains a small right subdural hematoma which now measures 2 mm, slightly smaller than that seen on the prior exam. No acute infarct is noted. Mild atrophic changes are noted commensurate with the patient's given age. ORBITS: No acute abnormality. SINUSES: No acute abnormality. SOFT TISSUES AND SKULL: Persistent right posterior parietal scalp hematoma noted. No skull fracture. IMPRESSION: 1. Small right subdural hematoma, 2 mm, slightly decreased from prior, without mass effect. 2. No new intracranial hemorrhage or acute infarct. 3.  Persistent right posterior parietal scalp hematoma. Electronically signed by: Oneil Devonshire MD 11/27/2023 09:03 PM EST RP Workstation: [id number]   DG Chest 2 View Result Date: 11/27/2023 EXAM: 2 VIEW(S) XRAY OF THE CHEST 11/27/2023 08:54:00 PM COMPARISON: 11/22/2023 CLINICAL HISTORY: cough, eval for pna FINDINGS: LUNGS AND PLEURA: No focal pulmonary opacity. No pleural effusion. No pneumothorax. HEART AND MEDIASTINUM: Mildly atherosclerotic thoracic aorta. No acute abnormality of the cardiac and mediastinal silhouettes. BONES AND SOFT TISSUES: Degenerative changes throughout thoracic spine. Degenerative changes in bilateral shoulders. High riding left humeral head consistent with rotator cuff disease. No acute osseous abnormality. IMPRESSION: 1. No acute cardiopulmonary abnormality. Electronically signed by: Luke Bun MD 11/27/2023 08:58 PM EST RP Workstation: [id number]     I have personally reviewed the images and agree with the above interpretation.  Labs:    Latest Ref Rng & Units 11/28/2023    8:11 AM 11/27/2023    5:16 PM 11/22/2023    7:43 PM  CBC  WBC 4.0 - 10.5 K/uL 9.4  14.5  12.4   Hemoglobin 12.0 - 15.0 g/dL 11.7  11.8  12.1   Hematocrit 36.0 - 46.0 % 34.7  34.0  36.3   Platelets 150 - 400 K/uL 332  318  323       Latest Ref Rng & Units 11/28/2023    2:52 AM 11/27/2023    5:16 PM 11/22/2023    7:43 PM  BMP  Glucose 70 - 99 mg/dL  889  897   BUN 8 - 23 mg/dL  13  31   Creatinine 9.55 - 1.00 mg/dL  9.21  8.77   Sodium 864 - 145 mmol/L 126  119  130   Potassium 3.5 - 5.1 mmol/L  4.9  4.3   Chloride 98 - 111 mmol/L  87  89   CO2 22 - 32 mmol/L  24  27   Calcium 8.9 - 10.3 mg/dL  9.9  89.0       Assessment and Plan: Ms. Metter is a pleasant 78 y.o. female with history of a subdural hematoma.  She was followed up in clinic recently but had a recent head CT in the emergency department was admitted for other failure to thrive.  At this point she does not have any new  neurologic deficits.  She does have a subdural hematoma that which is small and not currently operative.  I do not expect it to become acutely operative.  From our standpoint it is okay to do prophylactic anticoagulation.  She does not need any new antiepileptics.  We can continue to follow in clinic.  No acute intervention planned.  Penne MICAEL Sharps, MD/MSCR Dept. of Neurosurgery

## 2023-11-28 NOTE — Evaluation (Signed)
 Occupational Therapy Evaluation Patient Details Name: Sheryl Kennedy MRN: [id number] DOB: September 06, 1945 Today's Date: 11/28/2023   History of Present Illness   78 y.o. female with medical history significant of HTN, HLD, dCHF, hypothyroidism, gout, CKD-3B, chronic pain, hyponatremia, C. difficile colitis, varicose vein, skin cancer, recent SDH with admission ~73mo ago, who presents with weakness, fall, diarrhea.    Clinical Impressions Pt was seen for OT evaluation this date. Prior to recent hospital admissions, pt was fairly indep with [id number] and ADL/IADL. She reports having increased difficulty with bathing and IADL. Family has been assisting within the last week or two with housekeeping and meal prep. Pt lives with her spouse in a 2 story home, living on the main floor. Pt presents with deficits in strength, L>R BLE edema, balance, and activity tolerance, affecting safe and optimal ADL completion. Pt currently requires MIN A for bed mobility, CGA for ADL transfers with RW, and CGA for limited mobility in room with RW. Pt edu in role of acute OT and in use of DME for mobility (RW versus 4WW) and bathing to improve safety, decr falls risk, and support energy conservation. Pt verbalized understanding. Pt would benefit from skilled OT services to address noted impairments and functional limitations (see below for any additional details) in order to maximize safety and independence while minimizing future risk of falls, injury, and readmission. Anticipate the need for follow up OT services upon acute hospital DC.    If plan is discharge home, recommend the following:   A little help with walking and/or transfers;A little help with bathing/dressing/bathroom;Assistance with cooking/housework;Assist for transportation;Help with stairs or ramp for entrance     Functional Status Assessment   Patient has had a recent decline in their functional status and demonstrates the ability to make significant  improvements in function in a reasonable and predictable amount of time.     Equipment Recommendations   BSC/3in1;Other (comment) (2WW)     Recommendations for Other Services         Precautions/Restrictions   Precautions Precautions: Fall Recall of Precautions/Restrictions: Intact Restrictions Weight Bearing Restrictions Per Provider Order: No     Mobility Bed Mobility Overal bed mobility: Needs Assistance Bed Mobility: Supine to Sit, Sit to Supine     Supine to sit: Contact guard, HOB elevated, Used rails Sit to supine: Min assist   General bed mobility comments: MIN A for BLE mgt to return to bed    Transfers Overall transfer level: Needs assistance Equipment used: Rolling walker (2 wheels) Transfers: Sit to/from Stand Sit to Stand: Contact guard assist, From elevated surface           General transfer comment: ICU hospital bed rather tall for her height      Balance Overall balance assessment: Needs assistance Sitting-balance support: No upper extremity supported, Feet supported Sitting balance-Leahy Scale: Fair     Standing balance support: Bilateral upper extremity supported, During functional activity, Reliant on assistive device for balance Standing balance-Leahy Scale: Fair Standing balance comment: fair+ static, fair- dynamic with RW                           ADL either performed or assessed with clinical judgement   ADL Overall ADL's : Needs assistance/impaired                     Lower Body Dressing: Sit to/from stand;Minimal assistance;Contact guard assist Lower Body Dressing Details (indicate cue type  and reason): Pt able to doff/don socks seated EOB wiht figure 4 technique (increased difficulty with LLE), anticipate CGA - PRN MIN A for LB dressing involving STS transfers 2/2 decr dynamic standing balance Toilet Transfer: Contact guard assist;Cueing for safety;Ambulation;Rolling walker (2 wheels) Toilet Transfer  Details (indicate cue type and reason): simulated during session, has completed with RN recently         Functional mobility during ADLs: Contact guard assist;Rolling walker (2 wheels);Minimal assistance       Vision         Perception         Praxis         Pertinent Vitals/Pain Pain Assessment Pain Assessment: 0-10 Pain Score: 4  Pain Location: back of her head Pain Descriptors / Indicators: Sore Pain Intervention(s): Limited activity within patient's tolerance, Monitored during session, Repositioned     Extremity/Trunk Assessment Upper Extremity Assessment Upper Extremity Assessment: Generalized weakness   Lower Extremity Assessment Lower Extremity Assessment: Defer to PT evaluation (L>R LE edema noted)       Communication Communication Communication: No apparent difficulties   Cognition Arousal: Alert Behavior During Therapy: WFL for tasks assessed/performed Cognition: No apparent impairments                               Following commands: Intact       Cueing  General Comments   Cueing Techniques: Verbal cues  VSS   Exercises Other Exercises Other Exercises: Pt edu in role of acute OT and in use of DME for mobility (RW versus 4WW) and bathing to improve safety, decr falls risk, and support energy conservation   Shoulder Instructions      Home Living Family/patient expects to be discharged to:: Private residence Living Arrangements: Spouse/significant other Available Help at Discharge: Family;Available 24 hours/day Type of Home: House Home Access: Ramped entrance     Home Layout: Two level;Able to live on main level with bedroom/bathroom     Bathroom Shower/Tub: Chief Strategy Officer: Standard     Home Equipment: Cane - single point;Rollator (4 wheels)   Additional Comments: has a shower chair but pt reports may be too big to fit into tub/shower? TTB? unclear      Prior Functioning/Environment Prior  Level of Function : History of Falls (last six months);Driving;Needs assist             Mobility Comments: 4WW mostly but has left it behind and led to at least 1 fall ADLs Comments: more recent difficulty getting in/out of the shower, has been harder to do meal prep and housekeeping, granddaughters have assisted with housekeeping the last 2 weeks, children bring meals recently, drives but not in last week    OT Problem List: Decreased strength;Impaired balance (sitting and/or standing);Increased edema;Decreased activity tolerance;Decreased knowledge of use of DME or AE   OT Treatment/Interventions: Self-care/ADL training;Therapeutic exercise;Patient/family education;Balance training;Therapeutic activities;Energy conservation;DME and/or AE instruction      OT Goals(Current goals can be found in the care plan section)   Acute Rehab OT Goals Patient Stated Goal: get better and go home OT Goal Formulation: With patient/family Time For Goal Achievement: 12/12/23 Potential to Achieve Goals: Good ADL Goals Pt Will Perform Lower Body Dressing: with modified independence;with adaptive equipment;sitting/lateral leans;sit to/from stand Pt Will Transfer to Toilet: with modified independence;ambulating;regular height toilet (LRAD) Pt Will Perform Toileting - Clothing Manipulation and hygiene: with modified independence;sit to/from stand;sitting/lateral leans Additional  ADL Goal #1: Pt will verbalize plan to implement at least 2 learned falls prevention strategies into daily routines to minimize falls risk. Additional ADL Goal #2: Pt will verbalize plan to implement at least 2 learned energy conservation strategies into daily routines to minimize falls risk and support ADL/IADL independence.   OT Frequency:  Min 2X/week    Co-evaluation              AM-PAC OT 6 Clicks Daily Activity     Outcome Measure Help from another person eating meals?: None Help from another person taking care  of personal grooming?: None Help from another person toileting, which includes using toliet, bedpan, or urinal?: A Little Help from another person bathing (including washing, rinsing, drying)?: A Little Help from another person to put on and taking off regular upper body clothing?: None Help from another person to put on and taking off regular lower body clothing?: A Little 6 Click Score: 21   End of Session Equipment Utilized During Treatment: Rolling walker (2 wheels) Nurse Communication: Mobility status  Activity Tolerance: Patient tolerated treatment well Patient left: in bed;with call bell/phone within reach;with bed alarm set;with family/visitor present;Other (comment) (with MD)  OT Visit Diagnosis: Unsteadiness on feet (R26.81);Repeated falls (R29.6);Muscle weakness (generalized) (M62.81)                Time: 8450-8382 OT Time Calculation (min): 28 min Charges:  OT General Charges $OT Visit: 1 Visit OT Evaluation $OT Eval Low Complexity: 1 Low OT Treatments $Self Care/Home Management : 8-22 mins  Warren SAUNDERS., MPH, MS, OTR/L ascom [phone] 11/28/23, 4:53 PM

## 2023-11-28 NOTE — Consult Note (Signed)
 [facility]  CONSULT NOTE    Date: 11/28/2023                  Patient Name:  Sheryl Kennedy  MRN: [id number]  DOB: 1945-11-29  Age / Sex: 78 y.o., female         PCP: Auston Reyes [name], MD                 Service Requesting Consult: TRH                 Reason for Consult: Hyponatremia            History of Present Illness: Sheryl Kennedy is a 78 y.o.  female with hypertension, diastolic heart failure, hypothyroidism, gout, hyponatremia, C. difficile, skin cancers, hyperlipidemia, who was admitted to [facility] on 11/27/2023 for Hyponatremia [E87.1] Hematoma of scalp, initial encounter [S00.03XA] Fall, initial encounter [W19.XXXA]  Patient presents to the ED from [facility] clinic due to abnormal labs.  Patient states she has been feeling unwell at home.  Poor oral intake.  Does report nausea without vomiting, diarrhea for the past few days.  Was continuing to hydrate with water to prevent dehydration.  Sodium on ED arrival 119.  Was placed in stepdown and received 3% hypertonic saline.  Due to concern of rate of correction, drip has been paused at this time.   Medications: Outpatient medications: Medications Prior to Admission  Medication Sig Dispense Refill Last Dose/Taking   acetaminophen  (TYLENOL ) 500 MG tablet Take 1,000 mg by mouth 2 (two) times daily as needed for fever or headache (pain).   Unknown   allopurinol  (ZYLOPRIM ) 300 MG tablet Take 300 mg by mouth daily.   11/27/2023   feeding supplement (ENSURE PLUS HIGH PROTEIN) LIQD Take 237 mLs by mouth 2 (two) times daily between meals. 14220 mL 0 11/26/2023   fexofenadine (ALLEGRA) 180 MG tablet Take 80 mg by mouth 3 times/day as needed-between meals & bedtime for allergies.   Unknown   levothyroxine  (SYNTHROID ) 50 MCG tablet Take 50 mcg by mouth daily before breakfast.   11/27/2023   metoprolol  succinate (TOPROL -XL) 25 MG 24 hr tablet Take 25 mg by mouth daily.    11/27/2023   Multiple  Vitamins-Minerals (CENTRUM SILVER WOMEN 50+) TABS Take 1 tablet by mouth daily.   11/27/2023   potassium chloride  SA (K-DUR,KLOR-CON ) 20 MEQ tablet Take 20 mEq by mouth 3 (three) times daily.    11/27/2023   saccharomyces boulardii (FLORASTOR) 250 MG capsule Take 1 capsule (250 mg total) by mouth 2 (two) times daily. 60 capsule 0 Unknown   simvastatin  (ZOCOR ) 20 MG tablet Take 20 mg by mouth at bedtime.   11/27/2023   traMADol  (ULTRAM ) 50 MG tablet Take 50 mg by mouth every 12 (twelve) hours as needed.   Unknown   traZODone  (DESYREL ) 50 MG tablet Take 25 mg by mouth at bedtime as needed for sleep.   Unknown   fluconazole  (DIFLUCAN ) 100 MG tablet Take 1 tablet (100 mg total) by mouth daily. (Patient not taking: Reported on 11/27/2023) 7 tablet 0 Not Taking   ketoconazole  (NIZORAL ) 2 % shampoo WASH SCALP 2-3 TIMES WEEKLY, LET SIT 5 MINUTES AND RINSE (Patient taking differently: Apply 1 Application topically See admin instructions. Apply topically to scalp up to twice a week as needed for scalp irritation.) 120 mL 3    [Paused] spironolactone-hydrochlorothiazide (ALDACTAZIDE) 25-25 MG tablet Take 1 tablet by mouth daily.      torsemide (  DEMADEX) 20 MG tablet Take 20 mg by mouth daily.  (Patient not taking: Reported on 11/27/2023)   Not Taking    Current medications: Current Facility-Administered Medications  Medication Dose Route Frequency Provider Last Rate Last Admin   acetaminophen  (TYLENOL ) tablet 650 mg  650 mg Oral Q6H PRN Niu, Xilin, MD   650 mg at 11/28/23 9063   allopurinol  (ZYLOPRIM ) tablet 300 mg  300 mg Oral Daily Niu, Xilin, MD   300 mg at 11/28/23 9062   Chlorhexidine  Gluconate Cloth 2 % PADS 6 each  6 each Topical Daily Von Bellis, MD   6 each at 11/28/23 9060   fidaxomicin  (DIFICID ) tablet 200 mg  200 mg Oral BID Von Bellis, MD   200 mg at 11/28/23 1211   hydrALAZINE  (APRESOLINE ) injection 5 mg  5 mg Intravenous Q2H PRN Niu, Xilin, MD       levothyroxine  (SYNTHROID ) tablet 50  mcg  50 mcg Oral Q0600 Niu, Xilin, MD   50 mcg at 11/28/23 0704   metoprolol  succinate (TOPROL -XL) 24 hr tablet 25 mg  25 mg Oral Daily Von Bellis, MD   25 mg at 11/28/23 9061   multivitamin with minerals tablet 1 tablet  1 tablet Oral Daily Niu, Xilin, MD   1 tablet at 11/28/23 9062   ondansetron  (ZOFRAN ) injection 4 mg  4 mg Intravenous Q8H PRN Niu, Xilin, MD       saccharomyces boulardii (FLORASTOR) capsule 250 mg  250 mg Oral BID Niu, Xilin, MD   250 mg at 11/28/23 0957   simvastatin  (ZOCOR ) tablet 20 mg  20 mg Oral QHS Niu, Xilin, MD       traMADol  (ULTRAM ) tablet 50 mg  50 mg Oral Q12H PRN Niu, Xilin, MD       traZODone  (DESYREL ) tablet 25 mg  25 mg Oral QHS PRN Niu, Xilin, MD          Allergies: Allergies  Allergen Reactions   Ery-Tab [Erythromycin] Other (See Comments)    Unknown reaction   Fosamax [Alendronate] Other (See Comments)    Possible cause of hypercalcemia   Levaquin [Levofloxacin] Other (See Comments)     Dizziness    Aleve [Naproxen] Swelling and Other (See Comments)    Arthralgias  Swelling in hands/joints, also   Shellfish Allergy Swelling, Dermatitis and Other (See Comments)    Arthralgias Joint swelling, also Gout flares   Sudafed [Pseudoephedrine] Rash and Other (See Comments)    Rhinitis Sneezing   Sulfa Antibiotics Rash      Past Medical History: Past Medical History:  Diagnosis Date   Arthritis    Cancer (HCC) 08/2012   skin right anterior lower leg, squamous cell   DSAP (disseminated superficial actinic porokeratosis)    Gout    Hx of seasonal allergies    Hyperlipidemia    Hypertension    Renal disorder    CKD stage 3   SCC (squamous cell carcinoma) 07/31/2023   superfical infiltration, R crown scalp, referral to Dr. 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 Left  forearm. Excision 02/01/2023   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 complications      Past Surgical History: Past Surgical History:  Procedure Laterality Date   BACK SURGERY  2008   no metal in  back   BREAST BIOPSY Right 09/13/2010   core/neg   BREAST BIOPSY Right 12/09/2015   us  biopsy/neg   BREAST SURGERY Right 2012   CATARACT EXTRACTION W/PHACO Left 04/25/2022   Procedure: CATARACT EXTRACTION PHACO AND INTRAOCULAR LENS PLACEMENT (IOC) LEFT 3.42 00:25.2;  Surgeon: Myrna Adine Anes, MD;  [facility];  Service: Ophthalmology;  Laterality: Left;   CATARACT EXTRACTION W/PHACO Right 05/23/2022   Procedure: CATARACT EXTRACTION PHACO AND INTRAOCULAR LENS PLACEMENT (IOC) RIGHT 3.84 00:28.1;  Surgeon: Myrna Adine Anes, MD;  Location: [facility];  Service: Ophthalmology;  Laterality: Right;   CHONDROPLASTY Left 01/22/2016   Procedure: arthroscopic medial AND LATERAL CHONDROPLASTY;  Surgeon: Lynwood SHAUNNA Hue, MD;  Location: [facility];  Service: Orthopedics;  Laterality: Left;   COLONOSCOPY  2011   COLONOSCOPY WITH PROPOFOL  N/A 04/14/2015   Procedure: COLONOSCOPY WITH PROPOFOL ;  Surgeon: Gladis RAYMOND Mariner, MD;  Location: [facility];  Service: Endoscopy;  Laterality: N/A;   CYST REMOVAL HAND Left 2012   JOINT REPLACEMENT Right 09/30/2013   TOTAL KNEE REPLACEMENT, DR. HUE, [facility]   KNEE ARTHROSCOPY WITH MEDIAL MENISECTOMY Left 01/22/2016   Procedure: KNEE ARTHROSCOPY WITH MEDIAL AND LATERAL MENISECTOMY;  Surgeon: Lynwood SHAUNNA Hue, MD;  Location: [facility];  Service: Orthopedics;  Laterality: Left;   KNEE SURGERY  1997   LEFT HEART CATH AND CORONARY ANGIOGRAPHY N/A 10/25/2021   Procedure: LEFT HEART CATH AND CORONARY ANGIOGRAPHY;  Surgeon: Mady Bruckner, MD;  Location: [facility] INVASIVE CV LAB;  Service: Cardiovascular;  Laterality: N/A;   Stab pheblectomy  Left 2013   TONSILLECTOMY     VEIN CLOSURE Bilateral 2012     Family History: Family History   Problem Relation Age of Onset   Heart failure Mother    Heart disease Mother    Arthritis Mother    Colon cancer Paternal Grandmother    Breast cancer Neg Hx      Social History: Social History   Socioeconomic History   Marital status: Married    Spouse name: Not on file   Number of children: Not on file   Years of education: Not on file   Highest education level: Not on file  Occupational History   Not on file  Tobacco Use   Smoking status: Never   Smokeless tobacco: Never  Vaping Use   Vaping status: Never Used  Substance and Sexual Activity   Alcohol use: Yes    Comment: occassional   Drug use: No   Sexual activity: Not Currently  Other Topics Concern   Not on file  Social History Narrative   Not on file   Social Drivers of Health   Financial Resource Strain: Medium Risk (07/25/2023)   Received from [facility]   Overall Financial Resource Strain (CARDIA)    Difficulty of Paying Living Expenses: Somewhat hard  Food Insecurity: No Food Insecurity (10/27/2023)   Hunger Vital Sign    Worried About Running Out of Food in the Last Year: Never true    Ran Out of Food in the Last Year: Never true  Transportation Needs: No Transportation Needs (10/27/2023)   PRAPARE - Administrator, Civil Service (Medical): No    Lack of Transportation (Non-Medical): No  Physical Activity: Not on file  Stress: Not on file  Social Connections: Socially Integrated (10/27/2023)   Social Connection and Isolation Panel    Frequency of Communication with Friends and Family: More than three times a week  Frequency of Social Gatherings with Friends and Family: Three times a week    Attends Religious Services: More than 4 times per year    Active Member of Clubs or Organizations: Yes    Attends Banker Meetings: More than 4 times per year    Marital Status: Married  Catering Manager Violence: Not At Risk (10/27/2023)   Humiliation, Afraid,  Rape, and Kick questionnaire    Fear of Current or Ex-Partner: No    Emotionally Abused: No    Physically Abused: No    Sexually Abused: No     Review of Systems: Review of Systems  Constitutional:  Negative for chills, fever and malaise/fatigue.  HENT:  Negative for congestion, sore throat and tinnitus.   Eyes:  Negative for blurred vision and redness.  Respiratory:  Negative for cough, shortness of breath and wheezing.   Cardiovascular:  Negative for chest pain, palpitations, claudication and leg swelling.  Gastrointestinal:  Positive for diarrhea and nausea. Negative for abdominal pain, blood in stool and vomiting.  Genitourinary:  Negative for flank pain, frequency and hematuria.  Musculoskeletal:  Negative for back pain, falls and myalgias.  Skin:  Negative for rash.  Neurological:  Negative for dizziness, weakness and headaches.  Endo/Heme/Allergies:  Does not bruise/bleed easily.  Psychiatric/Behavioral:  Negative for depression. The patient is not nervous/anxious and does not have insomnia.     Vital Signs: Blood pressure 118/64, pulse 76, temperature 98.8 F (37.1 C), temperature source Oral, resp. rate 13, height 5' (1.524 m), weight 54.7 kg, SpO2 100%.  Weight trends: Filed Weights   11/27/23 1712 11/28/23 0852  Weight: 57.2 kg 54.7 kg    Physical Exam: General: NAD  Head: Normocephalic, atraumatic. Moist oral mucosal membranes  Eyes: Anicteric  Lungs:  Clear to auscultation, normal effort  Heart: Regular rate and rhythm  Abdomen:  Soft, nontender,   Extremities:  No peripheral edema.  Neurologic: Awake and alert  Skin: No lesions  Access: None     Lab results: Basic Metabolic Panel: Recent Labs  Lab 11/22/23 1943 11/27/23 1716 11/28/23 0252 11/28/23 0811 11/28/23 1116  NA 130* 119* 126* 133* 133*  K 4.3 4.9  --   --  3.7  CL 89* 87*  --   --  98  CO2 27 24  --   --  25  GLUCOSE 102* 110*  --   --  120*  BUN 31* 13  --   --  8  CREATININE  1.22* 0.78  --   --  0.78  CALCIUM 10.9* 9.9  --   --  9.7  MG  --  1.3*  --   --  1.9  PHOS  --  2.7  --   --  2.6    Liver Function Tests: Recent Labs  Lab 11/22/23 1943 11/27/23 1716  AST 38 47*  ALT 18 24  ALKPHOS 95 106  BILITOT 0.7 0.4  PROT 6.6 5.9*  ALBUMIN 4.1 3.7   No results for input(s): LIPASE, AMYLASE in the last 168 hours. No results for input(s): AMMONIA in the last 168 hours.  CBC: Recent Labs  Lab 11/22/23 1943 11/27/23 1716 11/28/23 0811  WBC 12.4* 14.5* 9.4  NEUTROABS 10.0*  --   --   HGB 12.1 11.8* 11.7*  HCT 36.3 34.0* 34.7*  MCV 91.2 87.9 89.0  PLT 323 318 332    Cardiac Enzymes: No results for input(s): CKTOTAL, CKMB, CKMBINDEX, TROPONINI in the last 168 hours.  BNP: Invalid input(s): POCBNP  CBG: Recent Labs  Lab 11/28/23 0907  GLUCAP 77    Microbiology: Results for orders placed or performed during the hospital encounter of 11/27/23  MRSA Next Gen by PCR, Nasal     Status: None   Collection Time: 11/28/23  8:01 AM   Specimen: Nasal Mucosa; Nasal Swab  Result Value Ref Range Status   MRSA by PCR Next Gen NOT DETECTED NOT DETECTED Final    Comment: (NOTE) The GeneXpert MRSA Assay (FDA approved for NASAL specimens only), is one component of a comprehensive MRSA colonization surveillance program. It is not intended to diagnose MRSA infection nor to guide or monitor treatment for MRSA infections. Test performance is not FDA approved in patients less than 18 years old. Performed at [facility], [address] [place], [place] [address]   Gastrointestinal Panel by PCR , Stool     Status: None   Collection Time: 11/28/23 10:39 AM   Specimen: Stool  Result Value Ref Range Status   Campylobacter species NOT DETECTED NOT DETECTED Final   Plesimonas shigelloides NOT DETECTED NOT DETECTED Final   Salmonella species NOT DETECTED NOT DETECTED Final   Yersinia enterocolitica NOT DETECTED NOT DETECTED Final    Vibrio species NOT DETECTED NOT DETECTED Final   Vibrio cholerae NOT DETECTED NOT DETECTED Final   Enteroaggregative E coli (EAEC) NOT DETECTED NOT DETECTED Final   Enteropathogenic E coli (EPEC) NOT DETECTED NOT DETECTED Final   Enterotoxigenic E coli (ETEC) NOT DETECTED NOT DETECTED Final   Shiga like toxin producing E coli (STEC) NOT DETECTED NOT DETECTED Final   Shigella/Enteroinvasive E coli (EIEC) NOT DETECTED NOT DETECTED Final   Cryptosporidium NOT DETECTED NOT DETECTED Final   Cyclospora cayetanensis NOT DETECTED NOT DETECTED Final   Entamoeba histolytica NOT DETECTED NOT DETECTED Final   Giardia lamblia NOT DETECTED NOT DETECTED Final   Adenovirus F40/41 NOT DETECTED NOT DETECTED Final   Astrovirus NOT DETECTED NOT DETECTED Final   Norovirus GI/GII NOT DETECTED NOT DETECTED Final   Rotavirus A NOT DETECTED NOT DETECTED Final   Sapovirus (I, II, IV, and V) NOT DETECTED NOT DETECTED Final    Comment: Performed at [facility] [place] [facility], [address] [place], [place] [address]  C Difficile Quick Screen w PCR reflex     Status: Abnormal   Collection Time: 11/28/23 10:39 AM   Specimen: STOOL  Result Value Ref Range Status   C Diff antigen POSITIVE (A) NEGATIVE Final   C Diff toxin POSITIVE (A) NEGATIVE Final   C Diff interpretation Toxin producing C. difficile detected.  Final    Comment: CRITICAL RESULT CALLED TO, READ BACK BY AND VERIFIED WITH: SULA GINS RN 1140 11/28/23 HNM Performed at [facility], [address] [place], [place] [address]     Coagulation Studies: Recent Labs    11/28/23 0955  LABPROT 14.0  INR 1.0    Urinalysis: Recent Labs    11/27/23 1716  COLORURINE STRAW*  LABSPEC 1.003*  PHURINE 6.0  GLUCOSEU NEGATIVE  HGBUR NEGATIVE  BILIRUBINUR NEGATIVE  KETONESUR NEGATIVE  PROTEINUR NEGATIVE  NITRITE NEGATIVE  LEUKOCYTESUR NEGATIVE      Imaging: MR BRAIN WO CONTRAST Result Date: 11/28/2023 EXAM: MRI Brain Without Contrast  11/28/2023 12:58:52 AM TECHNIQUE: Multiplanar multisequence MRI of the head/brain was performed without the administration of intravenous contrast. COMPARISON: CT head 11/27/2023 CLINICAL HISTORY: Transient ischemic attack (TIA) FINDINGS: BRAIN AND VENTRICLES: No acute infarct. Approximately 3 mm thick right cerebral  convexity subdural hematoma with small volume of adjacent frontal convexity subarachnoid hemorrhage. No mass. No significant midline shift. No hydrocephalus. The sella is unremarkable. Normal flow voids. ORBITS: No acute abnormality. SINUSES AND MASTOIDS: No acute abnormality. BONES AND SOFT TISSUES: Normal marrow signal. Right posterior scalp fluid, presumably hematoma. IMPRESSION: 1. Approximately 3 mm thick right cerebral convexity subdural hematoma with small volume of adjacent frontal convexity subarachnoid hemorrhage. 2. No acute infarct. Electronically signed by: Gilmore Molt MD 11/28/2023 01:03 AM EST RP Workstation: [id number]   CT Cervical Spine Wo Contrast Result Date: 11/27/2023 EXAM: CT CERVICAL SPINE WITHOUT CONTRAST 11/27/2023 08:59:18 PM TECHNIQUE: CT of the cervical spine was performed without the administration of intravenous contrast. Multiplanar reformatted images are provided for review. Automated exposure control, iterative reconstruction, and/or weight based adjustment of the mA/kV was utilized to reduce the radiation dose to as low as reasonably achievable. COMPARISON: Comparison from 11/07/2023. CLINICAL HISTORY: Generalized weakness. FINDINGS: CERVICAL SPINE: BONES AND ALIGNMENT: 7 cervical segments are well visualized. Mild anterolisthesis of C3 on C4 is noted. No acute fracture or acute facet abnormality is noted. DEGENERATIVE CHANGES: Multilevel disc space narrowing is noted throughout the cervical spine. Osteophytic changes and facet hypertrophic changes are seen. SOFT TISSUES: The surrounding soft tissue structures are unremarkable. LUNG APICES: The lung apices show  no acute abnormality. IMPRESSION: 1. Chronic appearing degenerative change of the cervical spine without acute abnormality. Electronically signed by: Oneil Devonshire MD 11/27/2023 09:05 PM EST RP Workstation: [id number]   CT HEAD WO CONTRAST ( ) Result Date: 11/27/2023 EXAM: CT HEAD WITHOUT CONTRAST 11/27/2023 08:59:18 PM TECHNIQUE: CT of the head was performed without the administration of intravenous contrast. Automated exposure control, iterative reconstruction, and/or weight based adjustment of the mA/kV was utilized to reduce the radiation dose to as low as reasonably achievable. COMPARISON: 11/17/2023 CLINICAL HISTORY: Weakness, recent SDH. FINDINGS: BRAIN AND VENTRICLES: No new focal hemorrhage is seen. There remains a small right subdural hematoma which now measures 2 mm, slightly smaller than that seen on the prior exam. No acute infarct is noted. Mild atrophic changes are noted commensurate with the patient's given age. ORBITS: No acute abnormality. SINUSES: No acute abnormality. SOFT TISSUES AND SKULL: Persistent right posterior parietal scalp hematoma noted. No skull fracture. IMPRESSION: 1. Small right subdural hematoma, 2 mm, slightly decreased from prior, without mass effect. 2. No new intracranial hemorrhage or acute infarct. 3. Persistent right posterior parietal scalp hematoma. Electronically signed by: Oneil Devonshire MD 11/27/2023 09:03 PM EST RP Workstation: [id number]   DG Chest 2 View Result Date: 11/27/2023 EXAM: 2 VIEW(S) XRAY OF THE CHEST 11/27/2023 08:54:00 PM COMPARISON: 11/22/2023 CLINICAL HISTORY: cough, eval for pna FINDINGS: LUNGS AND PLEURA: No focal pulmonary opacity. No pleural effusion. No pneumothorax. HEART AND MEDIASTINUM: Mildly atherosclerotic thoracic aorta. No acute abnormality of the cardiac and mediastinal silhouettes. BONES AND SOFT TISSUES: Degenerative changes throughout thoracic spine. Degenerative changes in bilateral shoulders. High riding left humeral head  consistent with rotator cuff disease. No acute osseous abnormality. IMPRESSION: 1. No acute cardiopulmonary abnormality. Electronically signed by: Luke Bun MD 11/27/2023 08:58 PM EST RP Workstation: [id number]     Assessment & Plan: Sheryl Kennedy is a 78 y.o.  female with hypertension, diastolic heart failure, hypothyroidism, gout, hyponatremia, C. difficile, skin cancers, hyperlipidemia, who was admitted to [facility] on 11/27/2023 for Hyponatremia [E87.1] Hematoma of scalp, initial encounter [S00.03XA] Fall, initial encounter [W19.XXXA]  Hyponatremia, appears secondary to increased free water intake with poor solute consumption. Sodium 119 on admission.  Started on 3% hypertonic saline. Currently paused due to rate of correction. Awaiting updated labs. Will determine if drip needs to be restarted at lower rate.     LOS: 1 Gustavo Dispenza 11/25/20251:41 PM

## 2023-11-28 NOTE — Consult Note (Signed)
 PHARMACY CONSULT NOTE - Hypertonic Saline  Pharmacy Consult for Hypertonic Saline Monitoring and Management  Recent Labs: Potassium (mmol/L)  Date Value  11/28/2023 3.7  10/03/2013 3.7   Magnesium  (mg/dL)  Date Value  88/74/7974 1.9   Calcium (mg/dL)  Date Value  88/74/7974 9.7   Calcium, Total (mg/dL)  Date Value  89/98/7984 9.2   Albumin (g/dL)  Date Value  88/75/7974 3.7   Phosphorus (mg/dL)  Date Value  88/74/7974 2.6   Sodium (mmol/L)  Date Value  11/28/2023 133 (L)  10/03/2013 134 (L)    Assessment  Sheryl Kennedy is a 78 y.o. female presenting with weakness, fall, and diarrhea. PMH significant for HTN, HLD, dCHF, hypothyroidism, gout, CKD-3B, chronic pain, hyponatremia, C. difficile colitis, varicose vein, skin cancer, SDH. Pharmacy has been consulted to monitor hypertonic saline (3%) infusion.  Pertinent medications: torsemide tablets  Baseline Labs: Serum Na 119, Serum Osm 248, Urine Na <30, Urine Osm 155  Goal of Therapy:  Increase in Na by 4-6 mEq/L in 4-6 hours Do not exceed increase in Na by 10-12 mEq/L in 24 hours  Monitoring:  Date Time Na Rate/Comment  11/24 1716 119 Baseline, HTS initiated at 78ml/hr, NaCl 1g twice daily 11/25 0252 126  11/25 0811 133 HTS stopped 11/25 1116 133  Plan:  Continue to hold hypertonic saline as patient has concern for overcorrection Continue to check Na Q4H  Stop infusion if  Na increases > 4 mEq/L in first 2 hours Na increases > 6 mEq/L in first 4 hours Na increases > 6 mEq/L in 6 hours  Na increases > 8 mEq/L in 8 hours (give D5W bolus) Continue to monitor for signs of clinical improvement and recommendations per nephrology  Idolina DELENA Percy, PharmD Clinical Pharmacist 11/28/2023 3:04 PM

## 2023-11-28 NOTE — Progress Notes (Addendum)
  1200 PM: The patient has tested positive for C. Diff. Dr. Von has been notified and the patient has been placed on Enteric precaution.   1050 AM: Stool sample has been sent down to lab to be analysed. A patient chart label has been used as the printer does not work to print the sunquest label and lab has been notified.    0957 AM: Orthostatic vital signs have been obtained and documented in flowsheet as well as a note has been created.

## 2023-11-28 NOTE — Consult Note (Addendum)
 WOC Nurse Consult Note: Reason for Consult:Consult requested to assist with topical treament recommendations which have been provided by the plastic surgery team.  Performed remotely after review of progress notes and photos in the EMR.  Plastic surgery team is following as an outpatient and patient has a full thickness growth to the top of his head related to squamous cell carcinoma.  It is yellow and raised above skin level with dry crusted peeling skin surrounding the site, 3X3.5cm, according to the progress notes.   Plastic surgery team has ordered Vashe gel be applied to the location BID.   This product is not available in the [facility] formulary.  I will provide a substitution to the product which will be similar.  Topical treatment orders provided for bedside nurses to perform as follows:  Cleanse head wound BID with Vashe moistened gauze, Soila # [phone]) then apply Intrasite wound gel Soila # [phone]) onto the gauze which is moist with Vashe and leave in place over the area.  Cover with foam dressing to hold in place if possible, if it will not adhere because of the hair, then leave wound gel open to air.    Pt should resume follow-up with the plastic surgery team after discharge.   Post note 11/26 0730: Received Secure chat message from the bedside nurse that the family member will bring in the Vashe gel from home for use, so orders modified back to the original plan of care as requested by the plastic surgery team.   Please re-consult if further assistance is needed.  Thank-you,  Stephane Fought MSN, RN, CWOCN, CWCN-AP, CNS Contact Mon-Fri 0700-1500: [phone]

## 2023-11-28 NOTE — Consult Note (Signed)
 NAME: Sheryl Kennedy  DOB: 03-Mar-1945  MRN: [id number]  Date/Time: 11/28/2023 2:55 PM  REQUESTING PROVIDER: Dr.Kumar Subjective:  REASON FOR CONSULT: cdiff diarrhea ? Sheryl Kennedy is a 78 y.o. with a history of cdiff colits has been treated 4 times in the past since July 2025 with vanco/fidoxamicin, SCC of scalp s/p Mohs surgery with recent hospitalization 10/26/23-10/29/23 at [facility]  for  pseudomonas streptococcus and citrobacter scalp wound infection and took total of 10 days (11/2)of antibiotic initially IV and then po levaquin/linezolid , was also on suppressive vanco PO for cdiff, mecahnical fall at home on 11/3 and came to ED on 11/4 and CT scan showed  acute 5 mm right subdural hematoma with 2 mm leftward midline shift. Cervical spine CT negative.  She was discharged from ED on keppra  as per NS recommendation and asked to follow up with neurosurgery as OP. She was continuing to have diarrhea and a repeat Cdiff test on 11/15/23 was negative ( the previous test was on 10/07/23 and it was PCR positive) She saw PCP on 11/14 and he ordered another STAT CT and that showed stable subdural hematoma She came back to ED on 11/19 with chest pain while having a BM. She had EKG, troponin , Na was 130- was dc from ED . She saw plastic surgeon for the scalp closure on 11/21 and he deferred it till the scalp hematoma had subsided. She saw neurosurgery on 11/24 and they referred her to neurology for multiple falls and facial droop A chem panel  was drawn by her PCP on 11/24 and it was Na of 120 and K of 5.3 and she was sent to the ED   Vitals in the ED  11/27/23 17:15  BP 125/81  Temp 97.9 F (36.6 C)  Pulse Rate 76  Resp 18  SpO2 100 %   As s  Latest Reference Range & Units 11/27/23 17:16  WBC 4.0 - 10.5 K/uL 14.5 (H)  Hemoglobin 12.0 - 15.0 g/dL 88.1 (L)  HCT 63.9 - 53.9 % 34.0 (L)  Platelets 150 - 400 K/uL 318  Creatinine 0.44 - 1.00 mg/dL 9.21   Na 880, K 4. She was admitted to ICU and Na was  being corrected  she was having diarrhea a stool for cdiff was positive and I am seeing the patient for the same She has been started on fidoxamicin  Pt has had diarrhea since a few months- had Seen GI at [facility] clinic 07/26/23 Cdiff PCP positive Given 10 days of vanco 09/05/23 Cdiff PCR positive given vanco 10/05/23 Cdiff positive 1 10/3-10/5 I 10/07/23 Cdiff antigen positive , toxin negative and PCR positivehospitalized for cdiff- treated with long taper fidoxamicin ( 20 days)  10/23-- 11/3 Antibiotic for scalp infection- vancomycin  was given  11/15/23- Cdiff antigen and toxin negative   Past Medical History:  Diagnosis Date   Arthritis    Cancer (HCC) 08/2012   skin right anterior lower leg, squamous cell   DSAP (disseminated superficial actinic porokeratosis)    Gout    Hx of seasonal allergies    Hyperlipidemia    Hypertension    Renal disorder    CKD stage 3   SCC (squamous cell carcinoma) 07/31/2023   superfical infiltration, R crown scalp, referral to Dr. Corey for [facility]   SCC (squamous cell carcinoma) 07/31/2023   R lateral lower leg, EDC 09/11/23   Squamous cell carcinoma of skin    R anterior lower leg   Squamous cell carcinoma of  skin 12/12/2022   SCC/KA type. Left forearm. Excision 02/01/2023   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 complications     Past Surgical History:  Procedure Laterality Date   BACK SURGERY  2008   no metal in back   BREAST BIOPSY Right 09/13/2010   core/neg   BREAST BIOPSY Right 12/09/2015   us  biopsy/neg   BREAST SURGERY Right 2012   CATARACT EXTRACTION W/PHACO Left 04/25/2022   Procedure: CATARACT EXTRACTION PHACO AND INTRAOCULAR LENS PLACEMENT (IOC) LEFT 3.42 00:25.2;  Surgeon: Myrna Adine Anes, MD;  Location: [facility];  Service: Ophthalmology;  Laterality: Left;   CATARACT EXTRACTION W/PHACO Right 05/23/2022   Procedure: CATARACT EXTRACTION PHACO AND INTRAOCULAR LENS PLACEMENT (IOC) RIGHT 3.84 00:28.1;   Surgeon: Myrna Adine Anes, MD;  Location: [facility];  Service: Ophthalmology;  Laterality: Right;   CHONDROPLASTY Left 01/22/2016   Procedure: arthroscopic medial AND LATERAL CHONDROPLASTY;  Surgeon: Lynwood SHAUNNA Hue, MD;  Location: [facility] OR[facility];  Service: Orthopedics;  Laterality: Left;   COLONOSCOPY  2011   COLONOSCOPY WITH PROPOFOL  N/A 04/14/2015   Procedure: COLONOSCOPY WITH PROPOFOL ;  Surgeon: Gladis RAYMOND Mariner, MD;  Location: [facility];  Service: Endoscopy;  Laterality: N/A;   CYST REMOVAL HAND Left 2012   JOINT REPLACEMENT Right 09/30/2013   TOTAL KNEE REPLACEMENT, DR. HUE, [facility]   KNEE ARTHROSCOPY WITH MEDIAL MENISECTOMY Left 01/22/2016   Procedure: KNEE ARTHROSCOPY WITH MEDIAL AND LATERAL MENISECTOMY;  Surgeon: Lynwood SHAUNNA Hue, MD;  Location: [facility];  Service: Orthopedics;  Laterality: Left;   KNEE SURGERY  1997   LEFT HEART CATH AND CORONARY ANGIOGRAPHY N/A 10/25/2021   Procedure: LEFT HEART CATH AND CORONARY ANGIOGRAPHY;  Surgeon: Mady Bruckner, MD;  Location: [facility] INVASIVE CV LAB;  Service: Cardiovascular;  Laterality: N/A;   Stab pheblectomy  Left 2013   TONSILLECTOMY     VEIN CLOSURE Bilateral 2012    Social History   Socioeconomic History   Marital status: Married    Spouse name: Not on file   Number of children: Not on file   Years of education: Not on file   Highest education level: Not on file  Occupational History   Not on file  Tobacco Use   Smoking status: Never   Smokeless tobacco: Never  Vaping Use   Vaping status: Never Used  Substance and Sexual Activity   Alcohol use: Yes    Comment: occassional   Drug use: No   Sexual activity: Not Currently  Other Topics Concern   Not on file  Social History Narrative   Not on file   Social Drivers of Health   Financial Resource Strain: Medium Risk (07/25/2023)   Received from [facility]   Overall Financial Resource Strain (CARDIA)    Difficulty of Paying Living Expenses:  Somewhat hard  Food Insecurity: No Food Insecurity (11/28/2023)   Hunger Vital Sign    Worried About Running Out of Food in the Last Year: Never true    Ran Out of Food in the Last Year: Never true  Transportation Needs: No Transportation Needs (11/28/2023)   PRAPARE - Administrator, Civil Service (Medical): No    Lack of Transportation (Non-Medical): No  Physical Activity: Not on file  Stress: Not on file  Social Connections: Socially Integrated (11/28/2023)   Social Connection and Isolation Panel    Frequency of Communication with Friends and Family: More than three times a week    Frequency of Social Gatherings with  Friends and Family: Three times a week    Attends Religious Services: More than 4 times per year    Active Member of Clubs or Organizations: Yes    Attends Banker Meetings: More than 4 times per year    Marital Status: Married  Catering Manager Violence: Not At Risk (11/28/2023)   Humiliation, Afraid, Rape, and Kick questionnaire    Fear of Current or Ex-Partner: No    Emotionally Abused: No    Physically Abused: No    Sexually Abused: No    Family History  Problem Relation Age of Onset   Heart failure Mother    Heart disease Mother    Arthritis Mother    Colon cancer Paternal Grandmother    Breast cancer Neg Hx    Allergies  Allergen Reactions   Ery-Tab [Erythromycin] Other (See Comments)    Unknown reaction   Fosamax [Alendronate] Other (See Comments)    Possible cause of hypercalcemia   Levaquin [Levofloxacin] Other (See Comments)     Dizziness    Aleve [Naproxen] Swelling and Other (See Comments)    Arthralgias  Swelling in hands/joints, also   Shellfish Allergy Swelling, Dermatitis and Other (See Comments)    Arthralgias Joint swelling, also Gout flares   Sudafed [Pseudoephedrine] Rash and Other (See Comments)    Rhinitis Sneezing   Sulfa Antibiotics Rash   I? Current Facility-Administered Medications  Medication  Dose Route Frequency Provider Last Rate Last Admin   acetaminophen  (TYLENOL ) tablet 650 mg  650 mg Oral Q6H PRN Niu, Xilin, MD   650 mg at 11/28/23 0936   allopurinol  (ZYLOPRIM ) tablet 300 mg  300 mg Oral Daily Niu, Xilin, MD   300 mg at 11/28/23 9062   Chlorhexidine  Gluconate Cloth 2 % PADS 6 each  6 each Topical Daily Von Bellis, MD   6 each at 11/28/23 9060   fidaxomicin  (DIFICID ) tablet 200 mg  200 mg Oral BID Von Bellis, MD   200 mg at 11/28/23 1211   hydrALAZINE  (APRESOLINE ) injection 5 mg  5 mg Intravenous Q2H PRN Niu, Xilin, MD       levothyroxine  (SYNTHROID ) tablet 50 mcg  50 mcg Oral Q0600 Niu, Xilin, MD   50 mcg at 11/28/23 0704   metoprolol  succinate (TOPROL -XL) 24 hr tablet 25 mg  25 mg Oral Daily Von Bellis, MD   25 mg at 11/28/23 9061   multivitamin with minerals tablet 1 tablet  1 tablet Oral Daily Niu, Xilin, MD   1 tablet at 11/28/23 9062   ondansetron  (ZOFRAN ) injection 4 mg  4 mg Intravenous Q8H PRN Niu, Xilin, MD       saccharomyces boulardii (FLORASTOR) capsule 250 mg  250 mg Oral BID Niu, Xilin, MD   250 mg at 11/28/23 9042   simvastatin  (ZOCOR ) tablet 20 mg  20 mg Oral QHS Niu, Xilin, MD       traMADol  (ULTRAM ) tablet 50 mg  50 mg Oral Q12H PRN Niu, Xilin, MD       traZODone  (DESYREL ) tablet 25 mg  25 mg Oral QHS PRN Niu, Xilin, MD         Abtx:  Anti-infectives (From admission, onward)    Start     Dose/Rate Route Frequency Ordered Stop   11/28/23 1245  fidaxomicin  (DIFICID ) tablet 200 mg        200 mg Oral 2 times daily 11/28/23 1153 12/08/23 0959       REVIEW OF SYSTEMS:  Const: negative fever, negative  chills, some weight loss Eyes: negative diplopia or visual changes, negative eye pain ENT: negative coryza, negative sore throat Resp: negative cough, hemoptysis, dyspnea Cards: negative for chest pain, palpitations, lower extremity edema GU: negative for frequency, dysuria and hematuria GI: , diarrhea ++  no, bleeding, constipation Skin: negative  for rash and pruritus Heme: negative for easy bruising and gum/nose bleeding MS:  weakness Neurolo:multiple falls  Psych:  anxiety,   Endocrine: negative for thyroid, diabetes Allergy/Immunology- as above Objective:  VITALS:  BP 116/62 (BP Location: Left Arm)   Pulse 76   Temp 98.8 F (37.1 C) (Oral)   Resp 14   Ht 5' (1.524 m)   Wt 54.7 kg   SpO2 100%   BMI 23.55 kg/m   PHYSICAL EXAM:  General: Alert, cooperative, no distress, frail Head:dressing over the head Removed the dressing  Scalp wound dry no purulence . Eyes: Conjunctivae clear, anicteric sclerae. Pupils are equal ENT Nares normal. No drainage or sinus tenderness. Lips, mucosa, and tongue normal. No Thrush Neck: Supple, symmetrical, no adenopathy, thyroid: non tender no carotid bruit and no JVD. Back: No CVA tenderness. Lungs: Clear to auscultation bilaterally. No Wheezing or Rhonchi. No rales. Heart: Regular rate and rhythm, no murmur, rub or gallop. Abdomen: Soft, non-tender,not distended. Bowel sounds normal. No masses Extremities: atraumatic, no cyanosis. No edema. No clubbing Skin: No rashes or lesions. Or bruising Lymph: Cervical, supraclavicular normal. Neurologic: Grossly non-focal Pertinent Labs Lab Results CBC    Component Value Date/Time   WBC 9.4 11/28/2023 0811   RBC 3.90 11/28/2023 0811   HGB 11.7 (L) 11/28/2023 0811   HGB 11.6 (L) 10/02/2013 0505   HCT 34.7 (L) 11/28/2023 0811   HCT 43.1 09/18/2013 0919   PLT 332 11/28/2023 0811   PLT 212 10/02/2013 0505   MCV 89.0 11/28/2023 0811   MCV 96 09/18/2013 0919   MCH 30.0 11/28/2023 0811   MCHC 33.7 11/28/2023 0811   RDW 15.5 11/28/2023 0811   RDW 16.0 (H) 09/18/2013 0919   LYMPHSABS 1.1 11/22/2023 1943   MONOABS 1.1 (H) 11/22/2023 1943   EOSABS 0.1 11/22/2023 1943   BASOSABS 0.0 11/22/2023 1943       Latest Ref Rng & Units 11/28/2023   11:16 AM 11/28/2023    8:11 AM 11/28/2023    2:52 AM  CMP  Glucose 70 - 99 mg/dL 879     BUN  8 - 23 mg/dL 8     Creatinine 9.55 - 1.00 mg/dL 9.21     Sodium 864 - 854 mmol/L 133  133  126   Potassium 3.5 - 5.1 mmol/L 3.7     Chloride 98 - 111 mmol/L 98     CO2 22 - 32 mmol/L 25     Calcium 8.9 - 10.3 mg/dL 9.7         Microbiology: Recent Results (from the past 240 hours)  MRSA Next Gen by PCR, Nasal     Status: None   Collection Time: 11/28/23  8:01 AM   Specimen: Nasal Mucosa; Nasal Swab  Result Value Ref Range Status   MRSA by PCR Next Gen NOT DETECTED NOT DETECTED Final    Comment: (NOTE) The GeneXpert MRSA Assay (FDA approved for NASAL specimens only), is one component of a comprehensive MRSA colonization surveillance program. It is not intended to diagnose MRSA infection nor to guide or monitor treatment for MRSA infections. Test performance is not FDA approved in patients less than 80 years old. Performed at [facility], [address] [place], [place] [address]   Gastrointestinal Panel by PCR , Stool     Status: None   Collection Time: 11/28/23 10:39 AM   Specimen: Stool  Result Value Ref Range Status   Campylobacter species NOT DETECTED NOT DETECTED Final   Plesimonas shigelloides NOT DETECTED NOT DETECTED Final   Salmonella species NOT DETECTED NOT DETECTED Final   Yersinia enterocolitica NOT DETECTED NOT DETECTED Final   Vibrio species NOT DETECTED NOT DETECTED Final   Vibrio cholerae NOT DETECTED NOT DETECTED Final   Enteroaggregative E coli (EAEC) NOT DETECTED NOT DETECTED Final   Enteropathogenic E coli (EPEC) NOT DETECTED NOT DETECTED Final   Enterotoxigenic E coli (ETEC) NOT DETECTED NOT DETECTED Final   Shiga like toxin producing E coli (STEC) NOT DETECTED NOT DETECTED Final   Shigella/Enteroinvasive E coli (EIEC) NOT DETECTED NOT DETECTED Final   Cryptosporidium NOT DETECTED NOT DETECTED Final   Cyclospora cayetanensis NOT DETECTED NOT DETECTED Final   Entamoeba histolytica NOT DETECTED NOT DETECTED Final   Giardia lamblia NOT  DETECTED NOT DETECTED Final   Adenovirus F40/41 NOT DETECTED NOT DETECTED Final   Astrovirus NOT DETECTED NOT DETECTED Final   Norovirus GI/GII NOT DETECTED NOT DETECTED Final   Rotavirus A NOT DETECTED NOT DETECTED Final   Sapovirus (I, II, IV, and V) NOT DETECTED NOT DETECTED Final    Comment: Performed at [facility], [address] [place], [place] [address]  C Difficile Quick Screen w PCR reflex     Status: Abnormal   Collection Time: 11/28/23 10:39 AM   Specimen: STOOL  Result Value Ref Range Status   C Diff antigen POSITIVE (A) NEGATIVE Final   C Diff toxin POSITIVE (A) NEGATIVE Final   C Diff interpretation Toxin producing C. difficile detected.  Final    Comment: CRITICAL RESULT CALLED TO, READ BACK BY AND VERIFIED WITH: SULA GINS RN 1140 11/28/23 HNM Performed at [facility], [address] [place], [place] [address]    IMAGING RESULTS: I have personally reviewed the films ? Impression/Recommendation Hyponatremia- management as per primary team Falls Subdural hematoma ? Cdiff diarrhea This is the 5 th time since July 2025- is followed by [facility] as Op Has been on 4 courses of meds before- one long fidoxamicin taper and 3 courses of vanco Now on fidoxamicin  Pt will need fecal transplant as OP- she should follow with [facility]  SCC of the scalp s/p Mohs surgery awaiting graft   Recently treated scalp wound infection- now looks good with no evidence of cellulitis or infection  Left TKA   Discussed the management with the patient  and husband at bed side This consult involved complex antimicrobial management   ________________________________________________

## 2023-11-28 NOTE — Progress Notes (Signed)
 [facility] Progress Note  Patient: GERARD BONUS    FMW:[id number]  DOA: 11/27/2023     Date of Service: the patient was seen and examined on 11/28/2023  Chief Complaint  Patient presents with   Abnormal Labs   Brief hospital course: Sheryl Kennedy is a 78 y.o. female with medical history significant of HTN, HLD, dCHF, hypothyroidism, gout, CKD-3B, chronic pain, hyponatremia, C. difficile colitis, varicose vein, skin cancer, SDH, who presents with weakness, fall, diarrhea.   Patient states that she has generalized weakness in the past 3 days.  She fell yesterday when she was walking in her kitchen, no LOC.  She injured the back of her head.  She has mild headache, no neck pain.  Patient states she had left facial numbness after fall which has resolved. Currently no unilateral numbness or tingling in extremities.  No facial droop or slurred speech.  Patient does not have chest pain, cough, SOB.  Patient states that she has chronic diarrhea for more than 6 months.  Her diarrhea has worsened in the past several days.  She has had 3 watery diarrhea this evening.  No nausea, vomiting, abdominal pain.  No fever or chills.  No symptoms of UTI. Pt has hx of SDH. She had follow-up with neurosurgery NP, Edsel Goods today, who recommended CT of head and MRI of brain.   Data reviewed independently and ED Course: pt was found to have WBC 14.5, GFR> 60, sodium 119, magnesium  1.3, potassium 4.9, phosphorus of 2.7.  Temperature normal, blood pressure 125/81, heart rate 76, RR 18, oxygen saturation 100% on room air.  Chest x-ray negative.  CT of C-spine negative for acute injury.  Patient is admitted to stepdown as inpatient.   CT of head: 1. Small right subdural hematoma, 2 mm, slightly decreased from prior, without mass effect. 2. No new intracranial hemorrhage or acute infarct. 3. Persistent right posterior parietal scalp hematoma.   MRI-brain: 1. Approximately 3 mm thick right cerebral convexity  subdural hematoma with small volume of adjacent frontal convexity subarachnoid hemorrhage. 2. No acute infarct.     EKG: I have personally reviewed.  Sinus rhythm, QTc 467, LAE, LAD, poor R wave progression.    Assessment and Plan:  # Recurrent C. difficile diarrhea Stool C. difficile positive Started Dificid  Continue probiotics Follow ID for further recommendation  # Hypotonic hyponatremia, symptomatic Na 119 on admission, serum osmolality 248, low S/p hypertonic saline 3% given and patient was admitted in the ICU Na 119>>133 repleted quickly, discontinued 3% saline Nephrology consulted for further management   # Hypomagnesemia, mag repleted. Monitor electrolytes daily and replete as needed.  # Subdural hematoma and subarachnoid hemorrhage status post fall MRI reviewed Neurosurgery consulted  # Hypertension, chronic diastolic CHF, HLD 10/25/2021 showed EF of 55-60% with grade 2 diastolic dysfunction.  Patient has mild leg edema, but no SOB or JVD, does not seem to have CHF exacerbation. - proBNP 570 slightly elevated Check BMP -Hold diuretics due to diarrhea and hyponatremia Continue Toprol -XL 25 mg p.o. daily and small statin 20 mg nightly home dose  # Hypothyroid: Synthroid   Body mass index is 23.55 kg/m.  Interventions:  Diet: Regular diet DVT Prophylaxis: SCDs, no heparin /Lovenox  due to SDH and SAH  Advance goals of care discussion: Full code  Family Communication: family was present at bedside, at the time of interview.  The pt provided permission to discuss medical plan with the family. Opportunity was given to ask question and all questions were  answered satisfactorily.   Disposition:  Pt is from Home, admitted with hyponatremia, recurrent C. difficile, fall and SDH, SAH, still very sick, which precludes a safe discharge. Discharge to TBD after PT and OT eval, when stable, may need few days to improve.  Subjective: No significant events overnight.   Patient denies any headache or dizziness.  Patient still having diarrhea, loose stools, no abdominal pain.  Denied any nausea or vomiting.  No chest pain or palpitation, no shortness of breath.  Physical Exam: General: NAD, lying comfortably Appear in no distress, affect appropriate Eyes: PERRLA ENT: Oral Mucosa Clear, moist  Neck: no JVD,  Cardiovascular: S1 and S2 Present, no Murmur,  Respiratory: good respiratory effort, Bilateral Air entry equal and Decreased, no Crackles, no wheezes Abdomen: Bowel Sound present, Soft and no tenderness,  Skin: no rashes Extremities: no Pedal edema, no calf tenderness Neurologic: without any new focal findings Gait not checked due to patient safety concerns  Vitals:   11/28/23 1100 11/28/23 1200 11/28/23 1237 11/28/23 1300  BP: (!) 98/53 (!) 96/55 118/64 116/62  Pulse: 84 74 76 76  Resp: 17 (!) 22 13 14   Temp:  98.8 F (37.1 C)    TempSrc:  Oral    SpO2: 96% 99% 100% 100%  Weight:      Height:        Intake/Output Summary (Last 24 hours) at 11/28/2023 1425 Last data filed at 11/28/2023 0813 Gross per 24 hour  Intake 800.73 ml  Output --  Net 800.73 ml   Filed Weights   11/27/23 1712 11/28/23 0852  Weight: 57.2 kg 54.7 kg    Data Reviewed: I have personally reviewed and interpreted daily labs, tele strips, imagings as discussed above. I reviewed all nursing notes, pharmacy notes, vitals, pertinent old records I have discussed plan of care as described above with RN and patient/family.  CBC: Recent Labs  Lab 11/22/23 1943 11/27/23 1716 11/28/23 0811  WBC 12.4* 14.5* 9.4  NEUTROABS 10.0*  --   --   HGB 12.1 11.8* 11.7*  HCT 36.3 34.0* 34.7*  MCV 91.2 87.9 89.0  PLT 323 318 332   Basic Metabolic Panel: Recent Labs  Lab 11/22/23 1943 11/27/23 1716 11/28/23 0252 11/28/23 0811 11/28/23 1116  NA 130* 119* 126* 133* 133*  K 4.3 4.9  --   --  3.7  CL 89* 87*  --   --  98  CO2 27 24  --   --  25  GLUCOSE 102* 110*  --    --  120*  BUN 31* 13  --   --  8  CREATININE 1.22* 0.78  --   --  0.78  CALCIUM 10.9* 9.9  --   --  9.7  MG  --  1.3*  --   --  1.9  PHOS  --  2.7  --   --  2.6    Studies: MR BRAIN WO CONTRAST Result Date: 11/28/2023 EXAM: MRI Brain Without Contrast 11/28/2023 12:58:52 AM TECHNIQUE: Multiplanar multisequence MRI of the head/brain was performed without the administration of intravenous contrast. COMPARISON: CT head 11/27/2023 CLINICAL HISTORY: Transient ischemic attack (TIA) FINDINGS: BRAIN AND VENTRICLES: No acute infarct. Approximately 3 mm thick right cerebral convexity subdural hematoma with small volume of adjacent frontal convexity subarachnoid hemorrhage. No mass. No significant midline shift. No hydrocephalus. The sella is unremarkable. Normal flow voids. ORBITS: No acute abnormality. SINUSES AND MASTOIDS: No acute abnormality. BONES AND SOFT TISSUES: Normal marrow signal. Right posterior  scalp fluid, presumably hematoma. IMPRESSION: 1. Approximately 3 mm thick right cerebral convexity subdural hematoma with small volume of adjacent frontal convexity subarachnoid hemorrhage. 2. No acute infarct. Electronically signed by: Gilmore Molt MD 11/28/2023 01:03 AM EST RP Workstation: [id number]   CT Cervical Spine Wo Contrast Result Date: 11/27/2023 EXAM: CT CERVICAL SPINE WITHOUT CONTRAST 11/27/2023 08:59:18 PM TECHNIQUE: CT of the cervical spine was performed without the administration of intravenous contrast. Multiplanar reformatted images are provided for review. Automated exposure control, iterative reconstruction, and/or weight based adjustment of the mA/kV was utilized to reduce the radiation dose to as low as reasonably achievable. COMPARISON: Comparison from 11/07/2023. CLINICAL HISTORY: Generalized weakness. FINDINGS: CERVICAL SPINE: BONES AND ALIGNMENT: 7 cervical segments are well visualized. Mild anterolisthesis of C3 on C4 is noted. No acute fracture or acute facet abnormality is  noted. DEGENERATIVE CHANGES: Multilevel disc space narrowing is noted throughout the cervical spine. Osteophytic changes and facet hypertrophic changes are seen. SOFT TISSUES: The surrounding soft tissue structures are unremarkable. LUNG APICES: The lung apices show no acute abnormality. IMPRESSION: 1. Chronic appearing degenerative change of the cervical spine without acute abnormality. Electronically signed by: Oneil Devonshire MD 11/27/2023 09:05 PM EST RP Workstation: [id number]   CT HEAD WO CONTRAST ( ) Result Date: 11/27/2023 EXAM: CT HEAD WITHOUT CONTRAST 11/27/2023 08:59:18 PM TECHNIQUE: CT of the head was performed without the administration of intravenous contrast. Automated exposure control, iterative reconstruction, and/or weight based adjustment of the mA/kV was utilized to reduce the radiation dose to as low as reasonably achievable. COMPARISON: 11/17/2023 CLINICAL HISTORY: Weakness, recent SDH. FINDINGS: BRAIN AND VENTRICLES: No new focal hemorrhage is seen. There remains a small right subdural hematoma which now measures 2 mm, slightly smaller than that seen on the prior exam. No acute infarct is noted. Mild atrophic changes are noted commensurate with the patient's given age. ORBITS: No acute abnormality. SINUSES: No acute abnormality. SOFT TISSUES AND SKULL: Persistent right posterior parietal scalp hematoma noted. No skull fracture. IMPRESSION: 1. Small right subdural hematoma, 2 mm, slightly decreased from prior, without mass effect. 2. No new intracranial hemorrhage or acute infarct. 3. Persistent right posterior parietal scalp hematoma. Electronically signed by: Oneil Devonshire MD 11/27/2023 09:03 PM EST RP Workstation: [id number]   DG Chest 2 View Result Date: 11/27/2023 EXAM: 2 VIEW(S) XRAY OF THE CHEST 11/27/2023 08:54:00 PM COMPARISON: 11/22/2023 CLINICAL HISTORY: cough, eval for pna FINDINGS: LUNGS AND PLEURA: No focal pulmonary opacity. No pleural effusion. No pneumothorax. HEART AND  MEDIASTINUM: Mildly atherosclerotic thoracic aorta. No acute abnormality of the cardiac and mediastinal silhouettes. BONES AND SOFT TISSUES: Degenerative changes throughout thoracic spine. Degenerative changes in bilateral shoulders. High riding left humeral head consistent with rotator cuff disease. No acute osseous abnormality. IMPRESSION: 1. No acute cardiopulmonary abnormality. Electronically signed by: Luke Bun MD 11/27/2023 08:58 PM EST RP Workstation: [id number]    Scheduled Meds:  allopurinol   300 mg Oral Daily   Chlorhexidine  Gluconate Cloth  6 each Topical Daily   fidaxomicin   200 mg Oral BID   levothyroxine   50 mcg Oral Q0600   metoprolol  succinate  25 mg Oral Daily   multivitamin with minerals  1 tablet Oral Daily   saccharomyces boulardii  250 mg Oral BID   simvastatin   20 mg Oral QHS   Continuous Infusions: PRN Meds: acetaminophen , hydrALAZINE , ondansetron  (ZOFRAN ) IV, traMADol , traZODone   Time spent: 55 minutes  Author: ELVAN SOR. MD [facility] 11/28/2023 2:25 PM  To reach On-call, see care teams to locate the attending  and reach out to them via [email or web address]. If 7PM-7AM, please contact night-coverage If you still have difficulty reaching the attending provider, please page the [facility] (Director on Call) for [facility] on amion for assistance.

## 2023-11-28 NOTE — Telephone Encounter (Signed)
 Pharmacy Patient Advocate Encounter  Insurance verification completed.    The patient is insured through [employer]. Patient has Medicare and is not eligible for a copay card, but may be able to apply for patient assistance or Medicare RX Payment Plan (Patient Must reach out to their plan, if eligible for payment plan), if available.    Ran test claim for Generic Dificid  200mg  and the current 30 day co-pay is $221.33.   This test claim was processed through [facility]- copay amounts may vary at other pharmacies due to pharmacy/plan contracts, or as the patient moves through the different stages of their insurance plan.

## 2023-11-28 NOTE — Telephone Encounter (Signed)
 Patient daughter called to inform Dr.Ravishankar that patient is currently admitted and has c-diff again.

## 2023-11-28 NOTE — Progress Notes (Signed)
 OT Cancellation Note  Patient Details Name: JANICA ELDRED MRN: [id number] DOB: 15-Jul-1945   Cancelled Treatment:    Reason Eval/Treat Not Completed: Medical issues which prohibited therapy. OT orders received. Pt with recent transfer from ED to ICU -  OT will hold evaluation at this time and see when appropriate at later date/time upon new or continuation of current orders.   Wiley Flicker L. Kayline Sheer, OTR/L  11/28/23, 8:38 AM

## 2023-11-29 DIAGNOSIS — S065XAA Traumatic subdural hemorrhage with loss of consciousness status unknown, initial encounter: Secondary | ICD-10-CM | POA: Diagnosis not present

## 2023-11-29 DIAGNOSIS — E871 Hypo-osmolality and hyponatremia: Secondary | ICD-10-CM | POA: Diagnosis not present

## 2023-11-29 DIAGNOSIS — W19XXXA Unspecified fall, initial encounter: Secondary | ICD-10-CM | POA: Diagnosis not present

## 2023-11-29 DIAGNOSIS — A0471 Enterocolitis due to Clostridium difficile, recurrent: Secondary | ICD-10-CM | POA: Diagnosis not present

## 2023-11-29 DIAGNOSIS — A0472 Enterocolitis due to Clostridium difficile, not specified as recurrent: Secondary | ICD-10-CM

## 2023-11-29 LAB — SODIUM
Sodium: 132 mmol/L — ABNORMAL LOW (ref 135–145)
Sodium: 130 mmol/L — ABNORMAL LOW (ref 135–145)
Sodium: 131 mmol/L — ABNORMAL LOW (ref 135–145)
Sodium: 130 mmol/L — ABNORMAL LOW (ref 135–145)
Sodium: 133 mmol/L — ABNORMAL LOW (ref 135–145)

## 2023-11-29 LAB — BASIC METABOLIC PANEL WITH GFR
Anion gap: 8 (ref 5–15)
BUN: 11 mg/dL (ref 8–23)
CO2: 24 mmol/L (ref 22–32)
Calcium: 9.4 mg/dL (ref 8.9–10.3)
Chloride: 101 mmol/L (ref 98–111)
Creatinine, Ser: 0.82 mg/dL (ref 0.44–1.00)
GFR, Estimated: >60 mL/min (ref >60)
Glucose, Bld: 87 mg/dL (ref 70–99)
Potassium: 3.9 mmol/L (ref 3.5–5.1)
Sodium: 134 mmol/L — ABNORMAL LOW (ref 135–145)

## 2023-11-29 LAB — MAGNESIUM: Magnesium: 1.8 mg/dL (ref 1.7–2.4)

## 2023-11-29 LAB — CBC
HCT: 33.5 % — ABNORMAL LOW (ref 36.0–46.0)
Hemoglobin: 11.4 g/dL — ABNORMAL LOW (ref 12.0–15.0)
MCH: 30 pg (ref 26.0–34.0)
MCHC: 34 g/dL (ref 30.0–36.0)
MCV: 88.2 fL (ref 80.0–100.0)
Platelets: 315 K/uL (ref 150–400)
RBC: 3.8 MIL/uL — ABNORMAL LOW (ref 3.87–5.11)
RDW: 15.8 % — ABNORMAL HIGH (ref 11.5–15.5)
WBC: 11.3 K/uL — ABNORMAL HIGH (ref 4.0–10.5)
nRBC: 0 % (ref 0.0–0.2)

## 2023-11-29 LAB — PHOSPHORUS: Phosphorus: 2 mg/dL — ABNORMAL LOW (ref 2.5–4.6)

## 2023-11-29 LAB — CORTISOL-AM, BLOOD: Cortisol - AM: 10.4 ug/dL (ref 6.7–22.6)

## 2023-11-29 MED ORDER — DEXTROSE 5 % IV SOLN: Freq: Once | INTRAVENOUS | Status: AC

## 2023-11-29 MED ORDER — VANCOMYCIN HCL 125 MG PO CAPS
125.0000 mg | ORAL_CAPSULE | ORAL | Status: DC
Start: 2023-12-28 — End: 2023-12-01

## 2023-11-29 MED ORDER — VANCOMYCIN HCL 125 MG PO CAPS
125.0000 mg | ORAL_CAPSULE | Freq: Four times a day (QID) | ORAL | Status: DC
Start: 2023-11-29 — End: 2023-12-01
  Administered 2023-11-29 – 2023-12-01 (×6): 125 mg via ORAL
  Filled 2023-11-29 (×8): qty 1

## 2023-11-29 MED ORDER — VANCOMYCIN HCL 125 MG PO CAPS: 125.0000 mg | ORAL_CAPSULE | ORAL | Status: DC

## 2023-11-29 MED ORDER — VANCOMYCIN HCL 125 MG PO CAPS: 125.0000 mg | ORAL_CAPSULE | Freq: Every day | ORAL | Status: DC

## 2023-11-29 MED ORDER — VANCOMYCIN HCL 125 MG PO CAPS
125.0000 mg | ORAL_CAPSULE | Freq: Four times a day (QID) | ORAL | Status: DC
  Filled 2023-11-29 (×2): qty 1

## 2023-11-29 MED ORDER — VANCOMYCIN HCL 125 MG PO CAPS: 125.0000 mg | ORAL_CAPSULE | Freq: Two times a day (BID) | ORAL | Status: DC

## 2023-11-29 MED ORDER — PSYLLIUM 95 % PO PACK
1.0000 | PACK | Freq: Every day | ORAL | Status: DC
  Administered 2023-11-29: 1 via ORAL
  Filled 2023-11-29 (×2): qty 1

## 2023-11-29 MED ORDER — VANCOMYCIN HCL 125 MG PO CAPS
125.0000 mg | ORAL_CAPSULE | Freq: Every day | ORAL | Status: DC
Start: 2023-12-21 — End: 2023-12-01

## 2023-11-29 MED ORDER — PSYLLIUM 95 % PO PACK
1.0000 | PACK | Freq: Every day | ORAL | Status: DC
  Administered 2023-11-30: 1 via ORAL
  Filled 2023-11-29 (×2): qty 1

## 2023-11-29 MED ORDER — VANCOMYCIN HCL 125 MG PO CAPS
125.0000 mg | ORAL_CAPSULE | Freq: Two times a day (BID) | ORAL | Status: DC
Start: 2023-12-13 — End: 2023-12-01

## 2023-11-29 NOTE — Care Management Important Message (Signed)
 Important Message  Patient Details  Name: Sheryl Kennedy MRN: [id number] Date of Birth: 1945-08-25   Important Message Given:  Yes - Medicare IM     Rojelio [name] 11/29/2023, 4:39 PM

## 2023-11-29 NOTE — TOC Progression Note (Signed)
 Transition of Care [facility]) - Progression Note    Patient Details  Name: Sheryl Kennedy MRN: [id number] Date of Birth: 1945-08-05  Transition of Care [facility]) CM/SW Contact  K'La JINNY Ruts, LCSW Phone Number: 11/29/2023, 3:31 PM  Clinical Narrative:    Chart reviewed. Provided the patient with a list of HH agencies. Patient consented to sending referral in the HUB.                     Expected Discharge Plan and Services                                               Social Drivers of Health (SDOH) Interventions SDOH Screenings   Food Insecurity: No Food Insecurity (11/28/2023)  Housing: Low Risk  (11/28/2023)  Transportation Needs: No Transportation Needs (11/28/2023)  Utilities: Not At Risk (11/28/2023)  Financial Resource Strain: Medium Risk (07/25/2023)   Received from [facility]  Social Connections: Socially Integrated (11/28/2023)  Tobacco Use: Low Risk  (11/27/2023)    Readmission Risk Interventions    11/29/2023   12:11 PM 10/29/2023    2:10 PM 10/27/2023    3:36 PM  Readmission Risk Prevention Plan  Post Dischage Appt   Complete  Medication Screening   Complete  Transportation Screening Complete Complete Complete  PCP or Specialist Appt within 5-7 Days Complete Complete   Home Care Screening Complete Complete   Medication Review (RN CM) Complete Complete

## 2023-11-29 NOTE — Progress Notes (Signed)
 [facility]  ROUNDING NOTE   Subjective:   Patient seen sitting up in bed Family at bedside Tolerating small meals Room air  Sodium 134  Objective:  Vital signs in last 24 hours:  Temp:  [98.3 F (36.8 C)-99.4 F (37.4 C)] 98.9 F (37.2 C) (11/26 0752) Pulse Rate:  [43-94] 94 (11/26 0826) Resp:  [10-27] 27 (11/26 0600) BP: (96-133)/(47-78) 121/60 (11/26 0826) SpO2:  [96 %-100 %] 98 % (11/26 0600) Weight:  [57.7 kg] 57.7 kg (11/26 0500)  Weight change: -2.453 kg Filed Weights   11/27/23 1712 11/28/23 0852 11/29/23 0500  Weight: 57.2 kg 54.7 kg 57.7 kg    Intake/Output: I/O last 3 completed shifts: In: 800.7 [I.V.:300.7; IV Piggyback:500] Out: -    Intake/Output this shift:  Total I/O In: 119.8 [I.V.:119.8] Out: -   Physical Exam: General: NAD  Head: Normocephalic, atraumatic. Moist oral mucosal membranes  Eyes: Anicteric  Lungs:  Clear to auscultation, normal effort  Heart: Regular rate and rhythm  Abdomen:  Soft, nontender  Extremities:  No peripheral edema.  Neurologic: Awake, alert, conversant  Skin: Warm,dry, no rash       Basic Metabolic Panel: Recent Labs  Lab 11/22/23 1943 11/27/23 1716 11/28/23 0252 11/28/23 1116 11/28/23 1452 11/28/23 1914 11/28/23 2312 11/29/23 0339 11/29/23 0840  NA 130* 119*   < > 133* 131* 130* 128* 134* 132*  K 4.3 4.9  --  3.7  --   --   --  3.9  --   CL 89* 87*  --  98  --   --   --  101  --   CO2 27 24  --  25  --   --   --  24  --   GLUCOSE 102* 110*  --  120*  --   --   --  87  --   BUN 31* 13  --  8  --   --   --  11  --   CREATININE 1.22* 0.78  --  0.78  --   --   --  0.82  --   CALCIUM 10.9* 9.9  --  9.7  --   --   --  9.4  --   MG  --  1.3*  --  1.9  --   --   --  1.8  --   PHOS  --  2.7  --  2.6  --   --   --  2.0*  --    < > = values in this interval not displayed.    Liver Function Tests: Recent Labs  Lab 11/22/23 1943 11/27/23 1716  AST 38 47*  ALT 18 24  ALKPHOS 95 106   BILITOT 0.7 0.4  PROT 6.6 5.9*  ALBUMIN 4.1 3.7   No results for input(s): LIPASE, AMYLASE in the last 168 hours. No results for input(s): AMMONIA in the last 168 hours.  CBC: Recent Labs  Lab 11/22/23 1943 11/27/23 1716 11/28/23 0811 11/29/23 0339  WBC 12.4* 14.5* 9.4 11.3*  NEUTROABS 10.0*  --   --   --   HGB 12.1 11.8* 11.7* 11.4*  HCT 36.3 34.0* 34.7* 33.5*  MCV 91.2 87.9 89.0 88.2  PLT 323 318 332 315    Cardiac Enzymes: No results for input(s): CKTOTAL, CKMB, CKMBINDEX, TROPONINI in the last 168 hours.  BNP: Invalid input(s): POCBNP  CBG: Recent Labs  Lab 11/28/23 0907  GLUCAP 77    Microbiology: Results for  orders placed or performed during the hospital encounter of 11/27/23  MRSA Next Gen by PCR, Nasal     Status: None   Collection Time: 11/28/23  8:01 AM   Specimen: Nasal Mucosa; Nasal Swab  Result Value Ref Range Status   MRSA by PCR Next Gen NOT DETECTED NOT DETECTED Final    Comment: (NOTE) The GeneXpert MRSA Assay (FDA approved for NASAL specimens only), is one component of a comprehensive MRSA colonization surveillance program. It is not intended to diagnose MRSA infection nor to guide or monitor treatment for MRSA infections. Test performance is not FDA approved in patients less than 53 years old. Performed at [facility], [address] [place], [place] [address]   Gastrointestinal Panel by PCR , Stool     Status: None   Collection Time: 11/28/23 10:39 AM   Specimen: Stool  Result Value Ref Range Status   Campylobacter species NOT DETECTED NOT DETECTED Final   Plesimonas shigelloides NOT DETECTED NOT DETECTED Final   Salmonella species NOT DETECTED NOT DETECTED Final   Yersinia enterocolitica NOT DETECTED NOT DETECTED Final   Vibrio species NOT DETECTED NOT DETECTED Final   Vibrio cholerae NOT DETECTED NOT DETECTED Final   Enteroaggregative E coli (EAEC) NOT DETECTED NOT DETECTED Final   Enteropathogenic E  coli (EPEC) NOT DETECTED NOT DETECTED Final   Enterotoxigenic E coli (ETEC) NOT DETECTED NOT DETECTED Final   Shiga like toxin producing E coli (STEC) NOT DETECTED NOT DETECTED Final   Shigella/Enteroinvasive E coli (EIEC) NOT DETECTED NOT DETECTED Final   Cryptosporidium NOT DETECTED NOT DETECTED Final   Cyclospora cayetanensis NOT DETECTED NOT DETECTED Final   Entamoeba histolytica NOT DETECTED NOT DETECTED Final   Giardia lamblia NOT DETECTED NOT DETECTED Final   Adenovirus F40/41 NOT DETECTED NOT DETECTED Final   Astrovirus NOT DETECTED NOT DETECTED Final   Norovirus GI/GII NOT DETECTED NOT DETECTED Final   Rotavirus A NOT DETECTED NOT DETECTED Final   Sapovirus (I, II, IV, and V) NOT DETECTED NOT DETECTED Final    Comment: Performed at [facility], [address] [place], [place] [address]  C Difficile Quick Screen w PCR reflex     Status: Abnormal   Collection Time: 11/28/23 10:39 AM   Specimen: STOOL  Result Value Ref Range Status   C Diff antigen POSITIVE (A) NEGATIVE Final   C Diff toxin POSITIVE (A) NEGATIVE Final   C Diff interpretation Toxin producing C. difficile detected.  Final    Comment: CRITICAL RESULT CALLED TO, READ BACK BY AND VERIFIED WITH: SULA GINS RN 1140 11/28/23 HNM Performed at [facility], [address] [place], [place] [address]     Coagulation Studies: Recent Labs    11/28/23 0955  LABPROT 14.0  INR 1.0    Urinalysis: Recent Labs    11/27/23 1716  COLORURINE STRAW*  LABSPEC 1.003*  PHURINE 6.0  GLUCOSEU NEGATIVE  HGBUR NEGATIVE  BILIRUBINUR NEGATIVE  KETONESUR NEGATIVE  PROTEINUR NEGATIVE  NITRITE NEGATIVE  LEUKOCYTESUR NEGATIVE      Imaging: MR BRAIN WO CONTRAST Result Date: 11/28/2023 EXAM: MRI Brain Without Contrast 11/28/2023 12:58:52 AM TECHNIQUE: Multiplanar multisequence MRI of the head/brain was performed without the administration of intravenous contrast. COMPARISON: CT head 11/27/2023 CLINICAL  HISTORY: Transient ischemic attack (TIA) FINDINGS: BRAIN AND VENTRICLES: No acute infarct. Approximately 3 mm thick right cerebral convexity subdural hematoma with small volume of adjacent frontal convexity subarachnoid hemorrhage. No mass. No significant midline shift. No hydrocephalus. The  sella is unremarkable. Normal flow voids. ORBITS: No acute abnormality. SINUSES AND MASTOIDS: No acute abnormality. BONES AND SOFT TISSUES: Normal marrow signal. Right posterior scalp fluid, presumably hematoma. IMPRESSION: 1. Approximately 3 mm thick right cerebral convexity subdural hematoma with small volume of adjacent frontal convexity subarachnoid hemorrhage. 2. No acute infarct. Electronically signed by: Gilmore Molt MD 11/28/2023 01:03 AM EST RP Workstation: [id number]   CT Cervical Spine Wo Contrast Result Date: 11/27/2023 EXAM: CT CERVICAL SPINE WITHOUT CONTRAST 11/27/2023 08:59:18 PM TECHNIQUE: CT of the cervical spine was performed without the administration of intravenous contrast. Multiplanar reformatted images are provided for review. Automated exposure control, iterative reconstruction, and/or weight based adjustment of the mA/kV was utilized to reduce the radiation dose to as low as reasonably achievable. COMPARISON: Comparison from 11/07/2023. CLINICAL HISTORY: Generalized weakness. FINDINGS: CERVICAL SPINE: BONES AND ALIGNMENT: 7 cervical segments are well visualized. Mild anterolisthesis of C3 on C4 is noted. No acute fracture or acute facet abnormality is noted. DEGENERATIVE CHANGES: Multilevel disc space narrowing is noted throughout the cervical spine. Osteophytic changes and facet hypertrophic changes are seen. SOFT TISSUES: The surrounding soft tissue structures are unremarkable. LUNG APICES: The lung apices show no acute abnormality. IMPRESSION: 1. Chronic appearing degenerative change of the cervical spine without acute abnormality. Electronically signed by: Oneil Devonshire MD 11/27/2023 09:05 PM  EST RP Workstation: [id number]   CT HEAD WO CONTRAST ( ) Result Date: 11/27/2023 EXAM: CT HEAD WITHOUT CONTRAST 11/27/2023 08:59:18 PM TECHNIQUE: CT of the head was performed without the administration of intravenous contrast. Automated exposure control, iterative reconstruction, and/or weight based adjustment of the mA/kV was utilized to reduce the radiation dose to as low as reasonably achievable. COMPARISON: 11/17/2023 CLINICAL HISTORY: Weakness, recent SDH. FINDINGS: BRAIN AND VENTRICLES: No new focal hemorrhage is seen. There remains a small right subdural hematoma which now measures 2 mm, slightly smaller than that seen on the prior exam. No acute infarct is noted. Mild atrophic changes are noted commensurate with the patient's given age. ORBITS: No acute abnormality. SINUSES: No acute abnormality. SOFT TISSUES AND SKULL: Persistent right posterior parietal scalp hematoma noted. No skull fracture. IMPRESSION: 1. Small right subdural hematoma, 2 mm, slightly decreased from prior, without mass effect. 2. No new intracranial hemorrhage or acute infarct. 3. Persistent right posterior parietal scalp hematoma. Electronically signed by: Oneil Devonshire MD 11/27/2023 09:03 PM EST RP Workstation: [id number]   DG Chest 2 View Result Date: 11/27/2023 EXAM: 2 VIEW(S) XRAY OF THE CHEST 11/27/2023 08:54:00 PM COMPARISON: 11/22/2023 CLINICAL HISTORY: cough, eval for pna FINDINGS: LUNGS AND PLEURA: No focal pulmonary opacity. No pleural effusion. No pneumothorax. HEART AND MEDIASTINUM: Mildly atherosclerotic thoracic aorta. No acute abnormality of the cardiac and mediastinal silhouettes. BONES AND SOFT TISSUES: Degenerative changes throughout thoracic spine. Degenerative changes in bilateral shoulders. High riding left humeral head consistent with rotator cuff disease. No acute osseous abnormality. IMPRESSION: 1. No acute cardiopulmonary abnormality. Electronically signed by: Luke Bun MD 11/27/2023 08:58 PM EST RP  Workstation: [id number]     Medications:     allopurinol   300 mg Oral Daily   Chlorhexidine  Gluconate Cloth  6 each Topical Daily   fidaxomicin   200 mg Oral BID   levothyroxine   50 mcg Oral Q0600   metoprolol  succinate  25 mg Oral Daily   multivitamin with minerals  1 tablet Oral Daily   psyllium  1 packet Oral Daily   saccharomyces boulardii  250 mg Oral BID   simvastatin   20 mg Oral QHS   acetaminophen ,  guaiFENesin -dextromethorphan , hydrALAZINE , ondansetron  (ZOFRAN ) IV, phenol, traMADol , traZODone   Assessment/ Plan:  Sheryl Kennedy is a 78 y.o.  female  with hypertension, diastolic heart failure, hypothyroidism, gout, hyponatremia, C. difficile, skin cancers, hyperlipidemia, who was admitted to [facility] on 11/27/2023 for Hyponatremia [E87.1] Hematoma of scalp, initial encounter [S00.03XA] Fall, initial encounter [W19.XXXA]   Hyponatremia, appears secondary to increased free water intake with poor solute consumption. Sodium 119 on admission. Initially received 3% hypertonic saline, but this was stopped yesterday once sodium reached 133. Did experience a small dip overnight to 128. Patient was given salt tab that increased to 134. Will order small D5 bolus of 250ml to stabilize levels. We feel patient is attempting to self correct and will monitor for now.   2. Chronic diastolic heart failure, Last ECHO from 12/30/21 shows EF 50%.   3. Hypertension, essential. Home regimen includes metoprolol , torsemide, and spironolactone-hydrochlorothiazide. Receiving only metoprolol  at this time    LOS: 2 Media Pizzini 11/26/202510:19 AM

## 2023-11-29 NOTE — Evaluation (Signed)
 Physical Therapy Evaluation Patient Details Name: Sheryl Kennedy MRN: [id number] DOB: 05-Aug-1945 Today's Date: 11/29/2023  History of Present Illness  78 y.o. female with medical history significant of HTN, HLD, dCHF, hypothyroidism, gout, CKD-3B, chronic pain, hyponatremia, C. difficile colitis, varicose vein, skin cancer, recent SDH with admission ~72mo ago, who presents with weakness, fall, diarrhea; admitted for management of acute hyponatremia.  Clinical Impression  Patient seated edge of bed upon arrival to room; supportive daughter present at bedside throughout session. Patient alert and oriented to basic information, follows commands and agreeable to participation with treatment session.  Denies pain; endorses generalized fatigue and weakness due to acute illness/hospitalization. Bilat UE/LE strength and ROM grossly symmetrical and WFL; no focal weakness appreciated.  Able to complete bed mobility with sup/mod indep; sit/stand, basic transfers and gait (100') with RW, cga.  Demonstrates partially reciprocal stepping pattern; significant trendelenburg pattern with excessive hip on femoral abduction in stance (R> L). Generally fatigued with mobility efforts, single standing rest period to complete distance. Do recommend continued use of RW for optimal safety/stability with gait efforts.  Would benefit from skilled PT to address above deficits and promote optimal return to PLOF.; recommend post-acute PT follow up as indicated by interdisciplinary care team.            If plan is discharge home, recommend the following: A little help with walking and/or transfers;A little help with bathing/dressing/bathroom   Can travel by private vehicle        Equipment Recommendations None recommended by PT  Recommendations for Other Services       Functional Status Assessment       Precautions / Restrictions Precautions Precautions: Fall Recall of Precautions/Restrictions:  Intact Restrictions Weight Bearing Restrictions Per Provider Order: No      Mobility  Bed Mobility           Sit to supine: Supervision   General bed mobility comments: seated edge of bed upon arrival to room    Transfers Overall transfer level: Needs assistance Equipment used: Rolling walker (2 wheels) Transfers: Sit to/from Stand Sit to Stand: Contact guard assist                Ambulation/Gait Ambulation/Gait assistance: Contact guard assist Gait Distance (Feet): 100 Feet Assistive device: Rolling walker (2 wheels)         General Gait Details: partially reciprocal stepping pattern; significant trendelenburg pattern with excessive hip on femoral abduction in stance (R> L).  Generally fatigued with mobility efforts, single standing rest period to complete distance.  Do recommend continued use of RW for optimal safety/stability with gait efforts.  Stairs            Wheelchair Mobility     Tilt Bed    Modified Rankin (Stroke Patients Only)       Balance Overall balance assessment: Needs assistance Sitting-balance support: No upper extremity supported, Feet supported Sitting balance-Leahy Scale: Good     Standing balance support: Bilateral upper extremity supported Standing balance-Leahy Scale: Fair                               Pertinent Vitals/Pain Pain Assessment Pain Assessment: No/denies pain    Home Living Family/patient expects to be discharged to:: Private residence Living Arrangements: Spouse/significant other Available Help at Discharge: Family;Available 24 hours/day Type of Home: House Home Access: Ramped entrance       Home Layout: Two level;Able to live  on main level with bedroom/bathroom Home Equipment: Cane - single point;Rollator (4 wheels) Additional Comments: has a shower chair but pt reports may be too big to fit into tub/shower? TTB? unclear    Prior Function Prior Level of Function : History of Falls  (last six months);Driving;Needs assist             Mobility Comments: At baseline, ambulatory without assist device; recently transitioned to 4WRW (within previous 3-4 weeks) due to progressive weakness.  Does endorse at least 4-5 falls in previous six months ADLs Comments: more recent difficulty getting in/out of the shower, has been harder to do meal prep and housekeeping, granddaughters have assisted with housekeeping the last 2 weeks, children bring meals recently, drives but not in last week     Extremity/Trunk Assessment   Upper Extremity Assessment Upper Extremity Assessment: Generalized weakness    Lower Extremity Assessment Lower Extremity Assessment: Generalized weakness (grossly 4-/5 throughout bilat LEs; no focal weakness appreciated)       Communication   Communication Communication: No apparent difficulties    Cognition Arousal: Alert Behavior During Therapy: WFL for tasks assessed/performed   PT - Cognitive impairments: No family/caregiver present to determine baseline                       PT - Cognition Comments: Alert and oriented, follows commands and agreeable to participation with session Following commands: Intact       Cueing Cueing Techniques: Verbal cues     General Comments      Exercises     Assessment/Plan    PT Assessment Patient needs continued PT services  PT Problem List Decreased strength;Decreased activity tolerance;Decreased balance;Decreased mobility;Decreased knowledge of use of DME;Decreased safety awareness;Decreased knowledge of precautions       PT Treatment Interventions DME instruction;Gait training;Functional mobility training;Therapeutic activities;Therapeutic exercise;Balance training;Patient/family education    PT Goals (Current goals can be found in the Care Plan section)  Acute Rehab PT Goals Patient Stated Goal: to return home PT Goal Formulation: With patient Time For Goal Achievement:  12/13/23 Potential to Achieve Goals: Good    Frequency Min 2X/week     Co-evaluation               AM-PAC PT 6 Clicks Mobility  Outcome Measure Help needed turning from your back to your side while in a flat bed without using bedrails?: None Help needed moving from lying on your back to sitting on the side of a flat bed without using bedrails?: None Help needed moving to and from a bed to a chair (including a wheelchair)?: None Help needed standing up from a chair using your arms (e.g., wheelchair or bedside chair)?: A Little Help needed to walk in hospital room?: A Little Help needed climbing 3-5 steps with a railing? : A Little 6 Click Score: 21    End of Session Equipment Utilized During Treatment: Gait belt Activity Tolerance: Patient tolerated treatment well Patient left: in bed;with call bell/phone within reach;with bed alarm set;with family/visitor present Nurse Communication: Mobility status PT Visit Diagnosis: Muscle weakness (generalized) (M62.81);Difficulty in walking, not elsewhere classified (R26.2)    Time: 8599-8574 PT Time Calculation (min) (ACUTE ONLY): 25 min   Charges:   PT Evaluation $PT Eval Moderate Complexity: 1 Mod   PT General Charges $$ ACUTE PT VISIT: 1 Visit        Emmilee Reamer H. Delores, PT, DPT, NCS 11/29/23, 3:07 PM [phone]

## 2023-11-29 NOTE — Plan of Care (Addendum)
 Sodium levels are being checked Q4. D5 fluid given this morning and is currently infusing as a one time order. The patient continues to have type 6 stools at this time. The patient is to be started on Metamucil. The patient had a bath, linen changed and wound care has been provided this morning.     Problem: Education: Goal: Knowledge of General Education information will improve Description: Including pain rating scale, medication(s)/side effects and non-pharmacologic comfort measures Outcome: Progressing   Problem: Health Behavior/Discharge Planning: Goal: Ability to manage health-related needs will improve Outcome: Progressing   Problem: Clinical Measurements: Goal: Ability to maintain clinical measurements within normal limits will improve Outcome: Progressing Goal: Will remain free from infection Outcome: Progressing Goal: Diagnostic test results will improve Outcome: Progressing Goal: Respiratory complications will improve Outcome: Progressing Goal: Cardiovascular complication will be avoided Outcome: Progressing   Problem: Activity: Goal: Risk for activity intolerance will decrease Outcome: Progressing   Problem: Nutrition: Goal: Adequate nutrition will be maintained Outcome: Progressing   Problem: Coping: Goal: Level of anxiety will decrease Outcome: Progressing   Problem: Elimination: Goal: Will not experience complications related to bowel motility Outcome: Progressing Goal: Will not experience complications related to urinary retention Outcome: Progressing   Problem: Pain Managment: Goal: General experience of comfort will improve and/or be controlled Outcome: Progressing   Problem: Safety: Goal: Ability to remain free from injury will improve Outcome: Progressing   Problem: Skin Integrity: Goal: Risk for impaired skin integrity will decrease Outcome: Progressing

## 2023-11-29 NOTE — Progress Notes (Signed)
 PROGRESS NOTE    Sheryl Kennedy  FMW:[id number] DOB: 1945/10/31 DOA: 11/27/2023 PCP: Auston Reyes [name], MD    Brief Narrative:  78 y.o. female with medical history significant of HTN, HLD, dCHF, hypothyroidism, gout, CKD-3B, chronic pain, hyponatremia, C. difficile colitis, varicose vein, skin cancer, SDH, who presents with weakness, fall, diarrhea.   Patient states that she has generalized weakness in the past 3 days.  She fell yesterday when she was walking in her kitchen, no LOC.  She injured the back of her head.  She has mild headache, no neck pain.  Patient states she had left facial numbness after fall which has resolved. Currently no unilateral numbness or tingling in extremities.  No facial droop or slurred speech.  Patient does not have chest pain, cough, SOB.  Patient states that she has chronic diarrhea for more than 6 months.  Her diarrhea has worsened in the past several days.  She has had 3 watery diarrhea this evening.  No nausea, vomiting, abdominal pain.  No fever or chills.  No symptoms of UTI. Pt has hx of SDH. She had follow-up with neurosurgery NP, Edsel Goods today, who recommended CT of head and MRI of brain.   Assessment & Plan:   Principal Problem:   Hyponatremia Active Problems:   Hypomagnesemia   Fall at home, initial encounter   Left facial numbness   SDH (subdural hematoma) (HCC)   Essential hypertension   Chronic diastolic CHF (congestive heart failure) (HCC)   Diarrhea   Leukocytosis   Gout   CKD stage 3b, GFR 30-44 ml/min (HCC)   Hyperlipidemia   Chronic pain   Hypothyroidism   Clostridium difficile diarrhea  # Recurrent C. difficile diarrhea Stool C. difficile positive Started Dificid  Continue probiotics Add psyllium for stool bulking effect Infectious disease following ID pharmacist following Follow ID for further recommendation   # Hypotonic hyponatremia, symptomatic Na 119 on admission, serum osmolality 248, low S/p hypertonic saline  3% given and patient was admitted in the ICU Na 119>>133 repleted quickly, discontinued 3% saline Nephrology consulted for further management On D5 water to slow rate of sodium climb     # Hypomagnesemia, mag repleted. Monitor electrolytes daily and replete as needed.   # Subdural hematoma and subarachnoid hemorrhage status post fall MRI reviewed Neurosurgery consulted No acute neurosurgical management recommended   # Hypertension, chronic diastolic CHF, HLD 10/25/2021 showed EF of 55-60% with grade 2 diastolic dysfunction.  Patient has mild leg edema, but no SOB or JVD, does not seem to have CHF exacerbation. - proBNP 570 slightly elevated Check BMP -Hold diuretics due to diarrhea and hyponatremia Continue Toprol -XL 25 mg p.o. daily and small statin 20 mg nightly home dose   # Hypothyroid: Synthroid      DVT prophylaxis: SCDs Code Status: Full Family Communication: Spouse at bedside 11/26 Disposition Plan: Status is: Inpatient Remains inpatient appropriate because: Multiple acute issues as above   Level of care: Telemetry  Consultants:  ID Nephrology  Procedures:  None  Antimicrobials: None   Subjective: Seen and examined.  Sitting up on edge of bed.  No distress.  Appears fatigued otherwise stable.  Objective: Vitals:   11/29/23 1000 11/29/23 1100 11/29/23 1120 11/29/23 1200  BP: (!) 122/55  114/64   Pulse: 86 78 72   Resp: 20 20 16    Temp:    98.3 F (36.8 C)  TempSrc:    Oral  SpO2: 100% 100% 100%   Weight:  Height:        Intake/Output Summary (Last 24 hours) at 11/29/2023 1451 Last data filed at 11/29/2023 1300 Gross per 24 hour  Intake 359.81 ml  Output 400 ml  Net -40.19 ml   Filed Weights   11/27/23 1712 11/28/23 0852 11/29/23 0500  Weight: 57.2 kg 54.7 kg 57.7 kg    Examination:  General exam: NAD.  Appears fatigued Respiratory system: Clear to auscultation. Respiratory effort normal. Cardiovascular system: S1-S2, RRR, no  murmurs, no pedal edema Gastrointestinal system: Soft, nontender, nondistended, hyperactive bowel sounds Central nervous system: Alert and oriented. No focal neurological deficits. Extremities: Symmetric 5 x 5 power. Skin: No rashes, lesions or ulcers Psychiatry: Judgement and insight appear normal. Mood & affect appropriate.     Data Reviewed: I have personally reviewed following labs and imaging studies  CBC: Recent Labs  Lab 11/22/23 1943 11/27/23 1716 11/28/23 0811 11/29/23 0339  WBC 12.4* 14.5* 9.4 11.3*  NEUTROABS 10.0*  --   --   --   HGB 12.1 11.8* 11.7* 11.4*  HCT 36.3 34.0* 34.7* 33.5*  MCV 91.2 87.9 89.0 88.2  PLT 323 318 332 315   Basic Metabolic Panel: Recent Labs  Lab 11/22/23 1943 11/27/23 1716 11/28/23 0252 11/28/23 1116 11/28/23 1452 11/28/23 1914 11/28/23 2312 11/29/23 0339 11/29/23 0840 11/29/23 1156  NA 130* 119*   < > 133*   < > 130* 128* 134* 132* 130*  K 4.3 4.9  --  3.7  --   --   --  3.9  --   --   CL 89* 87*  --  98  --   --   --  101  --   --   CO2 27 24  --  25  --   --   --  24  --   --   GLUCOSE 102* 110*  --  120*  --   --   --  87  --   --   BUN 31* 13  --  8  --   --   --  11  --   --   CREATININE 1.22* 0.78  --  0.78  --   --   --  0.82  --   --   CALCIUM 10.9* 9.9  --  9.7  --   --   --  9.4  --   --   MG  --  1.3*  --  1.9  --   --   --  1.8  --   --   PHOS  --  2.7  --  2.6  --   --   --  2.0*  --   --    < > = values in this interval not displayed.   GFR: Estimated Creatinine Clearance: 45.7 mL/min (by C-G formula based on SCr of 0.82 mg/dL). Liver Function Tests: Recent Labs  Lab 11/22/23 1943 11/27/23 1716  AST 38 47*  ALT 18 24  ALKPHOS 95 106  BILITOT 0.7 0.4  PROT 6.6 5.9*  ALBUMIN 4.1 3.7   No results for input(s): LIPASE, AMYLASE in the last 168 hours. No results for input(s): AMMONIA in the last 168 hours. Coagulation Profile: Recent Labs  Lab 11/22/23 1943 11/28/23 0955  INR 1.1 1.0   Cardiac  Enzymes: No results for input(s): CKTOTAL, CKMB, CKMBINDEX, TROPONINI in the last 168 hours. BNP (last 3 results) Recent Labs    11/27/23 1716  PROBNP 570.0*   HbA1C:  No results for input(s): HGBA1C in the last 72 hours. CBG: Recent Labs  Lab 11/28/23 0907  GLUCAP 77   Lipid Profile: No results for input(s): CHOL, HDL, LDLCALC, TRIG, CHOLHDL, LDLDIRECT in the last 72 hours. Thyroid Function Tests: No results for input(s): TSH, T4TOTAL, FREET4, T3FREE, THYROIDAB in the last 72 hours. Anemia Panel: Recent Labs    11/28/23 1116  FOLATE >20.0  TIBC 277  IRON 28   Sepsis Labs: No results for input(s): PROCALCITON, LATICACIDVEN in the last 168 hours.  Recent Results (from the past 240 hours)  MRSA Next Gen by PCR, Nasal     Status: None   Collection Time: 11/28/23  8:01 AM   Specimen: Nasal Mucosa; Nasal Swab  Result Value Ref Range Status   MRSA by PCR Next Gen NOT DETECTED NOT DETECTED Final    Comment: (NOTE) The GeneXpert MRSA Assay (FDA approved for NASAL specimens only), is one component of a comprehensive MRSA colonization surveillance program. It is not intended to diagnose MRSA infection nor to guide or monitor treatment for MRSA infections. Test performance is not FDA approved in patients less than 82 years old. Performed at [facility], [address] [place], [place] [address]   Gastrointestinal Panel by PCR , Stool     Status: None   Collection Time: 11/28/23 10:39 AM   Specimen: Stool  Result Value Ref Range Status   Campylobacter species NOT DETECTED NOT DETECTED Final   Plesimonas shigelloides NOT DETECTED NOT DETECTED Final   Salmonella species NOT DETECTED NOT DETECTED Final   Yersinia enterocolitica NOT DETECTED NOT DETECTED Final   Vibrio species NOT DETECTED NOT DETECTED Final   Vibrio cholerae NOT DETECTED NOT DETECTED Final   Enteroaggregative E coli (EAEC) NOT DETECTED NOT DETECTED Final    Enteropathogenic E coli (EPEC) NOT DETECTED NOT DETECTED Final   Enterotoxigenic E coli (ETEC) NOT DETECTED NOT DETECTED Final   Shiga like toxin producing E coli (STEC) NOT DETECTED NOT DETECTED Final   Shigella/Enteroinvasive E coli (EIEC) NOT DETECTED NOT DETECTED Final   Cryptosporidium NOT DETECTED NOT DETECTED Final   Cyclospora cayetanensis NOT DETECTED NOT DETECTED Final   Entamoeba histolytica NOT DETECTED NOT DETECTED Final   Giardia lamblia NOT DETECTED NOT DETECTED Final   Adenovirus F40/41 NOT DETECTED NOT DETECTED Final   Astrovirus NOT DETECTED NOT DETECTED Final   Norovirus GI/GII NOT DETECTED NOT DETECTED Final   Rotavirus A NOT DETECTED NOT DETECTED Final   Sapovirus (I, II, IV, and V) NOT DETECTED NOT DETECTED Final    Comment: Performed at [facility], [address] [place], [place] [address]  C Difficile Quick Screen w PCR reflex     Status: Abnormal   Collection Time: 11/28/23 10:39 AM   Specimen: STOOL  Result Value Ref Range Status   C Diff antigen POSITIVE (A) NEGATIVE Final   C Diff toxin POSITIVE (A) NEGATIVE Final   C Diff interpretation Toxin producing C. difficile detected.  Final    Comment: CRITICAL RESULT CALLED TO, READ BACK BY AND VERIFIED WITH: SULA GINS RN 1140 11/28/23 HNM Performed at [facility], [address] [place], [place] [address]          Radiology Studies: MR BRAIN WO CONTRAST Result Date: 11/28/2023 EXAM: MRI Brain Without Contrast 11/28/2023 12:58:52 AM TECHNIQUE: Multiplanar multisequence MRI of the head/brain was performed without the administration of intravenous contrast. COMPARISON: CT head 11/27/2023 CLINICAL HISTORY: Transient ischemic attack (TIA) FINDINGS: BRAIN AND  VENTRICLES: No acute infarct. Approximately 3 mm thick right cerebral convexity subdural hematoma with small volume of adjacent frontal convexity subarachnoid hemorrhage. No mass. No significant midline shift. No hydrocephalus. The  sella is unremarkable. Normal flow voids. ORBITS: No acute abnormality. SINUSES AND MASTOIDS: No acute abnormality. BONES AND SOFT TISSUES: Normal marrow signal. Right posterior scalp fluid, presumably hematoma. IMPRESSION: 1. Approximately 3 mm thick right cerebral convexity subdural hematoma with small volume of adjacent frontal convexity subarachnoid hemorrhage. 2. No acute infarct. Electronically signed by: Gilmore Molt MD 11/28/2023 01:03 AM EST RP Workstation: [id number]   CT Cervical Spine Wo Contrast Result Date: 11/27/2023 EXAM: CT CERVICAL SPINE WITHOUT CONTRAST 11/27/2023 08:59:18 PM TECHNIQUE: CT of the cervical spine was performed without the administration of intravenous contrast. Multiplanar reformatted images are provided for review. Automated exposure control, iterative reconstruction, and/or weight based adjustment of the mA/kV was utilized to reduce the radiation dose to as low as reasonably achievable. COMPARISON: Comparison from 11/07/2023. CLINICAL HISTORY: Generalized weakness. FINDINGS: CERVICAL SPINE: BONES AND ALIGNMENT: 7 cervical segments are well visualized. Mild anterolisthesis of C3 on C4 is noted. No acute fracture or acute facet abnormality is noted. DEGENERATIVE CHANGES: Multilevel disc space narrowing is noted throughout the cervical spine. Osteophytic changes and facet hypertrophic changes are seen. SOFT TISSUES: The surrounding soft tissue structures are unremarkable. LUNG APICES: The lung apices show no acute abnormality. IMPRESSION: 1. Chronic appearing degenerative change of the cervical spine without acute abnormality. Electronically signed by: Oneil Devonshire MD 11/27/2023 09:05 PM EST RP Workstation: [id number]   CT HEAD WO CONTRAST ( ) Result Date: 11/27/2023 EXAM: CT HEAD WITHOUT CONTRAST 11/27/2023 08:59:18 PM TECHNIQUE: CT of the head was performed without the administration of intravenous contrast. Automated exposure control, iterative reconstruction, and/or  weight based adjustment of the mA/kV was utilized to reduce the radiation dose to as low as reasonably achievable. COMPARISON: 11/17/2023 CLINICAL HISTORY: Weakness, recent SDH. FINDINGS: BRAIN AND VENTRICLES: No new focal hemorrhage is seen. There remains a small right subdural hematoma which now measures 2 mm, slightly smaller than that seen on the prior exam. No acute infarct is noted. Mild atrophic changes are noted commensurate with the patient's given age. ORBITS: No acute abnormality. SINUSES: No acute abnormality. SOFT TISSUES AND SKULL: Persistent right posterior parietal scalp hematoma noted. No skull fracture. IMPRESSION: 1. Small right subdural hematoma, 2 mm, slightly decreased from prior, without mass effect. 2. No new intracranial hemorrhage or acute infarct. 3. Persistent right posterior parietal scalp hematoma. Electronically signed by: Oneil Devonshire MD 11/27/2023 09:03 PM EST RP Workstation: [id number]   DG Chest 2 View Result Date: 11/27/2023 EXAM: 2 VIEW(S) XRAY OF THE CHEST 11/27/2023 08:54:00 PM COMPARISON: 11/22/2023 CLINICAL HISTORY: cough, eval for pna FINDINGS: LUNGS AND PLEURA: No focal pulmonary opacity. No pleural effusion. No pneumothorax. HEART AND MEDIASTINUM: Mildly atherosclerotic thoracic aorta. No acute abnormality of the cardiac and mediastinal silhouettes. BONES AND SOFT TISSUES: Degenerative changes throughout thoracic spine. Degenerative changes in bilateral shoulders. High riding left humeral head consistent with rotator cuff disease. No acute osseous abnormality. IMPRESSION: 1. No acute cardiopulmonary abnormality. Electronically signed by: Luke Bun MD 11/27/2023 08:58 PM EST RP Workstation: [id number]        Scheduled Meds:  allopurinol   300 mg Oral Daily   Chlorhexidine  Gluconate Cloth  6 each Topical Daily   fidaxomicin   200 mg Oral BID   levothyroxine   50 mcg Oral Q0600   metoprolol  succinate  25 mg Oral Daily   multivitamin with minerals  1 tablet  Oral Daily   psyllium  1 packet Oral Daily   saccharomyces boulardii  250 mg Oral BID   simvastatin   20 mg Oral QHS   Continuous Infusions:   LOS: 2 days    Calvin KATHEE Robson, MD [facility]   If 7PM-7AM, please contact night-coverage  11/29/2023, 2:51 PM

## 2023-11-29 NOTE — Progress Notes (Signed)
 Orthostatic vital signs obtained.   11/29/23 0752  Vitals  Temp 98.9 F (37.2 C)  Temp Source Oral  MEWS COLOR  MEWS Score Color Yellow  Orthostatic Lying   BP- Lying 121/60  Pulse- Lying 89  Orthostatic Sitting  BP- Sitting 108/70  Pulse- Sitting 105  Orthostatic Standing at 0 minutes  BP- Standing at 0 minutes 105/69  Pulse- Standing at 0 minutes 117  Orthostatic Standing at 3 minutes  BP- Standing at 3 minutes 112/62  Pulse- Standing at 3 minutes 115  Oxygen Therapy  SpO2 100 %  O2 Device Room Air  Pain Assessment  Pain Scale 0-10  Pain Score 0  Patients Stated Pain Goal 0  Pain Intervention(s) Refused  MEWS Score  MEWS Temp 0  MEWS Systolic 0  MEWS Pulse 0  MEWS RR 2  MEWS LOC 0  MEWS Score 2

## 2023-11-29 NOTE — TOC Initial Note (Signed)
 Transition of Care [facility]) - Initial/Assessment Note    Patient Details  Name: Sheryl Kennedy MRN: [id number] Date of Birth: 04/16/1945  Transition of Care [facility]) CM/SW Contact:    Corrie JINNY Ruts, LCSW Phone Number: 11/29/2023, 12:11 PM  Clinical Narrative:                 Chart reviewed. I was able to speak with the patient over the phone. I introduced myself, my role, and reason for consult. Consult received for SNF placement.   The patient confirmed that she has a PCP. The patient reports that she lives in the home with her husband. The patient reports that she was able to complete task independently. The patient reports that her husband or children would transport her to medical appointments. The [facility]. The patient reports that she has never had HH or been admitted into a SNF.   The patient reports that she has a cane and walker in the home. I informed the patient about [facility] and SNF placement. The patient verbalized understanding and was interested in [facility] and SNF placement.   SW waiting for PT evals. For final recommendation for the patient. ICM will follow up with the patient when PT consult is complete.          Patient Goals and CMS Choice            Expected Discharge Plan and Services                                              Prior Living Arrangements/Services                       Activities of Daily Living   ADL Screening (condition at time of admission) Independently performs ADLs?: Yes (appropriate for developmental age) Is the patient deaf or have difficulty hearing?: No Does the patient have difficulty seeing, even when wearing glasses/contacts?: No Does the patient have difficulty concentrating, remembering, or making decisions?: No  Permission Sought/Granted                  Emotional Assessment              Admission diagnosis:  Hyponatremia [E87.1] Hematoma of scalp, initial encounter  [S00.03XA] Fall, initial encounter [W19.XXXA] Patient Active Problem List   Diagnosis Date Noted   Clostridium difficile diarrhea 11/29/2023   Hypomagnesemia 11/28/2023   Chronic diastolic CHF (congestive heart failure) (HCC) 11/27/2023   Fall at home, initial encounter 11/27/2023   SDH (subdural hematoma) (HCC) 11/27/2023   Chronic pain 11/27/2023   Left facial numbness 11/27/2023   Infection of scalp 10/29/2023   SCC (squamous cell carcinoma), scalp/neck 10/29/2023   Cellulitis of head or scalp 10/26/2023   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 [place] [address] Phone: [phone] Fax: [phone]  [facility]  - [facility] [address] [place] [address] Phone: [phone] Fax: [phone]  [facility] - [facility] [address] [place] [address] Phone: [phone] Fax: [phone]     Social Drivers of Health (SDOH) Social History: SDOH Screenings   Food Insecurity: No Food Insecurity (11/28/2023)  Housing: Low Risk  (11/28/2023)  Transportation Needs: No Transportation Needs (11/28/2023)  Utilities: Not At Risk (11/28/2023)  Financial Resource Strain: Medium Risk (07/25/2023)   Received from [facility]  Social Connections: Socially Integrated (11/28/2023)  Tobacco Use: Low Risk  (11/27/2023)   SDOH Interventions:     Readmission Risk Interventions    11/29/2023   12:11 PM 10/29/2023    2:10 PM 10/27/2023    3:36 PM  Readmission Risk Prevention Plan  Post Dischage Appt   Complete  Medication Screening   Complete  Transportation Screening Complete Complete Complete  PCP or Specialist Appt within 5-7 Days Complete Complete   Home Care Screening Complete Complete   Medication Review (RN CM) Complete Complete

## 2023-11-29 NOTE — Progress Notes (Signed)
 Date of Admission:  11/27/2023     ID: Sheryl Kennedy is a 78 y.o. female  Principal Problem:   Hyponatremia Active Problems:   CKD stage 3b, GFR 30-44 ml/min (HCC)   Essential hypertension   Gout   Hyperlipidemia   Leukocytosis   Hypothyroidism   Diarrhea   Chronic diastolic CHF (congestive heart failure) (HCC)   Fall at home, initial encounter   SDH (subdural hematoma) (HCC)   Chronic pain   Left facial numbness   Hypomagnesemia   Clostridium difficile diarrhea   Daughter at bed side Subjective: Pt still has diarrhea Had to wake up at night   Medications:   allopurinol   300 mg Oral Daily   Chlorhexidine  Gluconate Cloth  6 each Topical Daily   levothyroxine   50 mcg Oral Q0600   metoprolol  succinate  25 mg Oral Daily   multivitamin with minerals  1 tablet Oral Daily   psyllium  1 packet Oral Daily   saccharomyces boulardii  250 mg Oral BID   simvastatin   20 mg Oral QHS   vancomycin   125 mg Oral QID   Followed by   NOREEN ON 12/13/2023] vancomycin   125 mg Oral BID   Followed by   NOREEN ON 12/21/2023] vancomycin   125 mg Oral Daily   Followed by   NOREEN ON 12/28/2023] vancomycin   125 mg Oral QODAY   Followed by   NOREEN ON 01/05/2024] vancomycin   125 mg Oral Q3 days    Objective: Vital signs in last 24 hours: Patient Vitals for the past 24 hrs:  BP Temp Temp src Pulse Resp SpO2 Weight  11/29/23 1715 (!) 110/52 98.9 F (37.2 C) Oral 74 16 97 % --  11/29/23 1614 124/71 -- -- 75 (!) 29 100 % --  11/29/23 1600 -- 98.9 F (37.2 C) Oral 75 19 100 % --  11/29/23 1500 (!) 106/57 -- -- 74 (!) 24 100 % --  11/29/23 1400 -- -- -- (!) 102 19 98 % --  11/29/23 1300 121/69 -- -- 72 12 100 % --  11/29/23 1200 114/60 98.3 F (36.8 C) Oral 71 18 100 % --  11/29/23 1120 114/64 -- -- 72 16 100 % --  11/29/23 1100 -- -- -- 78 20 100 % --  11/29/23 1000 (!) 122/55 -- -- 86 20 100 % --  11/29/23 0900 122/65 -- -- 83 17 100 % --  11/29/23 0826 121/60 -- -- 94 -- -- --   11/29/23 0800 106/74 -- -- -- 15 -- --  11/29/23 0752 -- 98.9 F (37.2 C) Oral -- -- 100 % --  11/29/23 0700 (!) 119/54 -- -- 78 16 96 % --  11/29/23 0600 (!) 120/58 -- -- 78 (!) 27 98 % --  11/29/23 0500 125/62 -- -- 77 14 97 % 57.7 kg  11/29/23 0400 133/69 98.3 F (36.8 C) Oral 78 15 99 % --  11/29/23 0300 130/74 -- -- 83 17 96 % --  11/29/23 0200 125/65 -- -- 76 16 100 % --  11/29/23 0100 120/62 -- -- 76 15 99 % --  11/29/23 0000 114/60 99.2 F (37.3 C) Oral 68 14 99 % --  11/28/23 2300 127/67 -- -- 73 10 100 % --  11/28/23 2235 -- -- -- 74 18 -- --  11/28/23 2200 109/66 -- -- 75 19 100 % --  11/28/23 2100 106/78 -- -- 75 19 100 % --  11/28/23 2000 114/64 98.3 F (36.8 C) Oral  78 16 100 % --  11/28/23 1900 (!) 106/47 -- -- 78 14 99 % --       PHYSICAL EXAM:  General: Alert, cooperative, no distress, appears stated age.  Head: dressing not removed today Lungs: Clear to auscultation bilaterally. No Wheezing or Rhonchi. No rales. Heart: Regular rate and rhythm, no murmur, rub or gallop. Abdomen: Soft, non-tender,not distended. Bowel sounds normal. No masses Extremities: atraumatic, no cyanosis. No edema. No clubbing Skin: No rashes or lesions. Or bruising Lymph: Cervical, supraclavicular normal. Neurologic: Grossly non-focal  Lab Results    Latest Ref Rng & Units 11/29/2023    3:39 AM 11/28/2023    8:11 AM 11/27/2023    5:16 PM  CBC  WBC 4.0 - 10.5 K/uL 11.3  9.4  14.5   Hemoglobin 12.0 - 15.0 g/dL 88.5  88.2  88.1   Hematocrit 36.0 - 46.0 % 33.5  34.7  34.0   Platelets 150 - 400 K/uL 315  332  318        Latest Ref Rng & Units 11/29/2023    3:25 PM 11/29/2023   11:56 AM 11/29/2023    8:40 AM  CMP  Sodium 135 - 145 mmol/L 131  130  132       Microbiology: Stool cdiff- antigen and toxin positive Studies/Results: MR BRAIN WO CONTRAST Result Date: 11/28/2023 EXAM: MRI Brain Without Contrast 11/28/2023 12:58:52 AM TECHNIQUE: Multiplanar multisequence MRI of  the head/brain was performed without the administration of intravenous contrast. COMPARISON: CT head 11/27/2023 CLINICAL HISTORY: Transient ischemic attack (TIA) FINDINGS: BRAIN AND VENTRICLES: No acute infarct. Approximately 3 mm thick right cerebral convexity subdural hematoma with small volume of adjacent frontal convexity subarachnoid hemorrhage. No mass. No significant midline shift. No hydrocephalus. The sella is unremarkable. Normal flow voids. ORBITS: No acute abnormality. SINUSES AND MASTOIDS: No acute abnormality. BONES AND SOFT TISSUES: Normal marrow signal. Right posterior scalp fluid, presumably hematoma. IMPRESSION: 1. Approximately 3 mm thick right cerebral convexity subdural hematoma with small volume of adjacent frontal convexity subarachnoid hemorrhage. 2. No acute infarct. Electronically signed by: Gilmore Molt MD 11/28/2023 01:03 AM EST RP Workstation: [id number]   CT Cervical Spine Wo Contrast Result Date: 11/27/2023 EXAM: CT CERVICAL SPINE WITHOUT CONTRAST 11/27/2023 08:59:18 PM TECHNIQUE: CT of the cervical spine was performed without the administration of intravenous contrast. Multiplanar reformatted images are provided for review. Automated exposure control, iterative reconstruction, and/or weight based adjustment of the mA/kV was utilized to reduce the radiation dose to as low as reasonably achievable. COMPARISON: Comparison from 11/07/2023. CLINICAL HISTORY: Generalized weakness. FINDINGS: CERVICAL SPINE: BONES AND ALIGNMENT: 7 cervical segments are well visualized. Mild anterolisthesis of C3 on C4 is noted. No acute fracture or acute facet abnormality is noted. DEGENERATIVE CHANGES: Multilevel disc space narrowing is noted throughout the cervical spine. Osteophytic changes and facet hypertrophic changes are seen. SOFT TISSUES: The surrounding soft tissue structures are unremarkable. LUNG APICES: The lung apices show no acute abnormality. IMPRESSION: 1. Chronic appearing  degenerative change of the cervical spine without acute abnormality. Electronically signed by: Oneil Devonshire MD 11/27/2023 09:05 PM EST RP Workstation: [id number]   CT HEAD WO CONTRAST ( ) Result Date: 11/27/2023 EXAM: CT HEAD WITHOUT CONTRAST 11/27/2023 08:59:18 PM TECHNIQUE: CT of the head was performed without the administration of intravenous contrast. Automated exposure control, iterative reconstruction, and/or weight based adjustment of the mA/kV was utilized to reduce the radiation dose to as low as reasonably achievable. COMPARISON: 11/17/2023 CLINICAL HISTORY: Weakness, recent SDH. FINDINGS: BRAIN AND  VENTRICLES: No new focal hemorrhage is seen. There remains a small right subdural hematoma which now measures 2 mm, slightly smaller than that seen on the prior exam. No acute infarct is noted. Mild atrophic changes are noted commensurate with the patient's given age. ORBITS: No acute abnormality. SINUSES: No acute abnormality. SOFT TISSUES AND SKULL: Persistent right posterior parietal scalp hematoma noted. No skull fracture. IMPRESSION: 1. Small right subdural hematoma, 2 mm, slightly decreased from prior, without mass effect. 2. No new intracranial hemorrhage or acute infarct. 3. Persistent right posterior parietal scalp hematoma. Electronically signed by: Oneil Devonshire MD 11/27/2023 09:03 PM EST RP Workstation: [id number]   DG Chest 2 View Result Date: 11/27/2023 EXAM: 2 VIEW(S) XRAY OF THE CHEST 11/27/2023 08:54:00 PM COMPARISON: 11/22/2023 CLINICAL HISTORY: cough, eval for pna FINDINGS: LUNGS AND PLEURA: No focal pulmonary opacity. No pleural effusion. No pneumothorax. HEART AND MEDIASTINUM: Mildly atherosclerotic thoracic aorta. No acute abnormality of the cardiac and mediastinal silhouettes. BONES AND SOFT TISSUES: Degenerative changes throughout thoracic spine. Degenerative changes in bilateral shoulders. High riding left humeral head consistent with rotator cuff disease. No acute osseous  abnormality. IMPRESSION: 1. No acute cardiopulmonary abnormality. Electronically signed by: Luke Bun MD 11/27/2023 08:58 PM EST RP Workstation: [id number]     Assessment/Plan: Cdiff diarrhea This is the 5 th time since July 2025- is followed by [facility] as Op Has been on 4 courses of meds before- one long fidoxamicin taper and 3 regular courses of vanco  Now on fidoxamicin- says it is not helping and she feels vanco worked better Will do a 6 week taper of vancomycin - see the order    Pt will need fecal transplant as OP- she should follow with [facility]  Hyponatremia- management as per primary team, improving Falls Subdural hematoma ?    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

## 2023-11-29 NOTE — Progress Notes (Signed)
 Pharmacy - Fidaxomicin  follow-up  Patient on Fidaxomycin for recurrent C difficile infection.  Copay check came back as $221.  Spoke to patient's daughter, Consuelo, and explained current plan to discharge on this medication.  She state that patient would be able to afford the copay.  I suggest using [facility] community pharmacy.   Zell Doucette, PharmD, BCPS, BCIDP Work Cell: [phone] 11/29/2023 12:07 PM

## 2023-11-29 NOTE — Progress Notes (Signed)
 The patient will be transfer to room 127 in 1C. Report has been called to Nat SAUNDERS, CHARITY FUNDRAISER.

## 2023-11-30 DIAGNOSIS — E871 Hypo-osmolality and hyponatremia: Secondary | ICD-10-CM | POA: Diagnosis not present

## 2023-11-30 LAB — CBC
HCT: 31.1 % — ABNORMAL LOW (ref 36.0–46.0)
Hemoglobin: 10.6 g/dL — ABNORMAL LOW (ref 12.0–15.0)
MCH: 30.7 pg (ref 26.0–34.0)
MCHC: 34.1 g/dL (ref 30.0–36.0)
MCV: 90.1 fL (ref 80.0–100.0)
Platelets: 315 K/uL (ref 150–400)
RBC: 3.45 MIL/uL — ABNORMAL LOW (ref 3.87–5.11)
RDW: 15.9 % — ABNORMAL HIGH (ref 11.5–15.5)
WBC: 8.7 K/uL (ref 4.0–10.5)
nRBC: 0 % (ref 0.0–0.2)

## 2023-11-30 LAB — PHOSPHORUS: Phosphorus: 2 mg/dL — ABNORMAL LOW (ref 2.5–4.6)

## 2023-11-30 LAB — BASIC METABOLIC PANEL WITH GFR
Anion gap: 8 (ref 5–15)
BUN: 10 mg/dL (ref 8–23)
CO2: 24 mmol/L (ref 22–32)
Calcium: 9.3 mg/dL (ref 8.9–10.3)
Chloride: 101 mmol/L (ref 98–111)
Creatinine, Ser: 0.78 mg/dL (ref 0.44–1.00)
GFR, Estimated: >60 mL/min (ref >60)
Glucose, Bld: 89 mg/dL (ref 70–99)
Potassium: 3.7 mmol/L (ref 3.5–5.1)
Sodium: 133 mmol/L — ABNORMAL LOW (ref 135–145)

## 2023-11-30 LAB — SODIUM
Sodium: 135 mmol/L (ref 135–145)
Sodium: 135 mmol/L (ref 135–145)

## 2023-11-30 LAB — MAGNESIUM: Magnesium: 1.6 mg/dL — ABNORMAL LOW (ref 1.7–2.4)

## 2023-11-30 MED ORDER — NUTRISOURCE FIBER PO PACK
1.0000 | PACK | Freq: Two times a day (BID) | ORAL | Status: DC
  Administered 2023-11-30 – 2023-12-01 (×3): 1 via ORAL
  Filled 2023-11-30 (×3): qty 1

## 2023-11-30 MED ORDER — MAGNESIUM SULFATE 2 GM/50ML IV SOLN
2.0000 g | Freq: Once | INTRAVENOUS | Status: AC
  Administered 2023-11-30: 2 g via INTRAVENOUS
  Filled 2023-11-30: qty 50

## 2023-11-30 MED ORDER — K PHOS MONO-SOD PHOS DI & MONO 155-852-130 MG PO TABS
500.0000 mg | ORAL_TABLET | ORAL | Status: AC
  Administered 2023-11-30 (×2): 500 mg via ORAL
  Filled 2023-11-30 (×2): qty 2

## 2023-11-30 NOTE — Progress Notes (Signed)
 [facility]  ROUNDING NOTE   Subjective:   Patient laying in bed. Serum sodium stabilized at 135. In good spirits today.  Objective:  Vital signs in last 24 hours:  Temp:  [97.8 F (36.6 C)-98.9 F (37.2 C)] 98.4 F (36.9 C) (11/27 0746) Pulse Rate:  [71-102] 83 (11/27 0746) Resp:  [12-29] 18 (11/27 0746) BP: (106-124)/(52-71) 121/62 (11/27 0746) SpO2:  [96 %-100 %] 99 % (11/27 0746)  Weight change:  Filed Weights   11/27/23 1712 11/28/23 0852 11/29/23 0500  Weight: 57.2 kg 54.7 kg 57.7 kg    Intake/Output: I/O last 3 completed shifts: In: 1259.8 [P.O.:1140; I.V.:119.8] Out: 400 [Urine:400]   Intake/Output this shift:  Total I/O In: 240 [P.O.:240] Out: -   Physical Exam: General: NAD  Head: Normocephalic, atraumatic. Moist oral mucosal membranes  Eyes: Anicteric  Lungs:  Clear to auscultation, normal effort  Heart: Regular rate and rhythm  Abdomen:  Soft, nontender  Extremities: No peripheral edema.  Neurologic: Awake, alert, conversant  Skin: Warm,dry, no rash       Basic Metabolic Panel: Recent Labs  Lab 11/27/23 1716 11/28/23 0252 11/28/23 1116 11/28/23 1452 11/29/23 0339 11/29/23 0840 11/29/23 1525 11/29/23 1843 11/29/23 2320 11/30/23 0300 11/30/23 0716  NA 119*   < > 133*   < > 134*   < > 131* 130* 133* 133* 135  K 4.9  --  3.7  --  3.9  --   --   --   --  3.7  --   CL 87*  --  98  --  101  --   --   --   --  101  --   CO2 24  --  25  --  24  --   --   --   --  24  --   GLUCOSE 110*  --  120*  --  87  --   --   --   --  89  --   BUN 13  --  8  --  11  --   --   --   --  10  --   CREATININE 0.78  --  0.78  --  0.82  --   --   --   --  0.78  --   CALCIUM 9.9  --  9.7  --  9.4  --   --   --   --  9.3  --   MG 1.3*  --  1.9  --  1.8  --   --   --   --  1.6*  --   PHOS 2.7  --  2.6  --  2.0*  --   --   --   --  2.0*  --    < > = values in this interval not displayed.    Liver Function Tests: Recent Labs  Lab 11/27/23 1716   AST 47*  ALT 24  ALKPHOS 106  BILITOT 0.4  PROT 5.9*  ALBUMIN 3.7   No results for input(s): LIPASE, AMYLASE in the last 168 hours. No results for input(s): AMMONIA in the last 168 hours.  CBC: Recent Labs  Lab 11/27/23 1716 11/28/23 0811 11/29/23 0339 11/30/23 0300  WBC 14.5* 9.4 11.3* 8.7  HGB 11.8* 11.7* 11.4* 10.6*  HCT 34.0* 34.7* 33.5* 31.1*  MCV 87.9 89.0 88.2 90.1  PLT 318 332 315 315    Cardiac Enzymes: No results for input(s): CKTOTAL, CKMB, CKMBINDEX, TROPONINI  in the last 168 hours.  BNP: Invalid input(s): POCBNP  CBG: Recent Labs  Lab 11/28/23 0907  GLUCAP 77    Microbiology: Results for orders placed or performed during the hospital encounter of 11/27/23  MRSA Next Gen by PCR, Nasal     Status: None   Collection Time: 11/28/23  8:01 AM   Specimen: Nasal Mucosa; Nasal Swab  Result Value Ref Range Status   MRSA by PCR Next Gen NOT DETECTED NOT DETECTED Final    Comment: (NOTE) The GeneXpert MRSA Assay (FDA approved for NASAL specimens only), is one component of a comprehensive MRSA colonization surveillance program. It is not intended to diagnose MRSA infection nor to guide or monitor treatment for MRSA infections. Test performance is not FDA approved in patients less than 27 years old. Performed at [facility], [address] [place], [place] [address]   Gastrointestinal Panel by PCR , Stool     Status: None   Collection Time: 11/28/23 10:39 AM   Specimen: Stool  Result Value Ref Range Status   Campylobacter species NOT DETECTED NOT DETECTED Final   Plesimonas shigelloides NOT DETECTED NOT DETECTED Final   Salmonella species NOT DETECTED NOT DETECTED Final   Yersinia enterocolitica NOT DETECTED NOT DETECTED Final   Vibrio species NOT DETECTED NOT DETECTED Final   Vibrio cholerae NOT DETECTED NOT DETECTED Final   Enteroaggregative E coli (EAEC) NOT DETECTED NOT DETECTED Final   Enteropathogenic E coli (EPEC) NOT  DETECTED NOT DETECTED Final   Enterotoxigenic E coli (ETEC) NOT DETECTED NOT DETECTED Final   Shiga like toxin producing E coli (STEC) NOT DETECTED NOT DETECTED Final   Shigella/Enteroinvasive E coli (EIEC) NOT DETECTED NOT DETECTED Final   Cryptosporidium NOT DETECTED NOT DETECTED Final   Cyclospora cayetanensis NOT DETECTED NOT DETECTED Final   Entamoeba histolytica NOT DETECTED NOT DETECTED Final   Giardia lamblia NOT DETECTED NOT DETECTED Final   Adenovirus F40/41 NOT DETECTED NOT DETECTED Final   Astrovirus NOT DETECTED NOT DETECTED Final   Norovirus GI/GII NOT DETECTED NOT DETECTED Final   Rotavirus A NOT DETECTED NOT DETECTED Final   Sapovirus (I, II, IV, and V) NOT DETECTED NOT DETECTED Final    Comment: Performed at [facility], [address] [place], [place] [address]  C Difficile Quick Screen w PCR reflex     Status: Abnormal   Collection Time: 11/28/23 10:39 AM   Specimen: STOOL  Result Value Ref Range Status   C Diff antigen POSITIVE (A) NEGATIVE Final   C Diff toxin POSITIVE (A) NEGATIVE Final   C Diff interpretation Toxin producing C. difficile detected.  Final    Comment: CRITICAL RESULT CALLED TO, READ BACK BY AND VERIFIED WITH: SULA GINS RN 1140 11/28/23 HNM Performed at [facility], [address] [place], [place] [address]     Coagulation Studies: Recent Labs    11/28/23 0955  LABPROT 14.0  INR 1.0    Urinalysis: Recent Labs    11/27/23 1716  COLORURINE STRAW*  LABSPEC 1.003*  PHURINE 6.0  GLUCOSEU NEGATIVE  HGBUR NEGATIVE  BILIRUBINUR NEGATIVE  KETONESUR NEGATIVE  PROTEINUR NEGATIVE  NITRITE NEGATIVE  LEUKOCYTESUR NEGATIVE      Imaging: No results found.    Medications:    magnesium  sulfate bolus IVPB 2 g (11/30/23 1036)    allopurinol   300 mg Oral Daily   Chlorhexidine  Gluconate Cloth  6 each Topical Daily   levothyroxine   50 mcg Oral Q0600   metoprolol   succinate  25 mg Oral Daily   multivitamin with  minerals  1 tablet Oral Daily   phosphorus  500 mg Oral Q4H   psyllium  1 packet Oral Daily   saccharomyces boulardii  250 mg Oral BID   simvastatin   20 mg Oral QHS   vancomycin   125 mg Oral QID   Followed by   NOREEN ON 12/13/2023] vancomycin   125 mg Oral BID   Followed by   NOREEN ON 12/21/2023] vancomycin   125 mg Oral Daily   Followed by   NOREEN ON 12/28/2023] vancomycin   125 mg Oral QODAY   Followed by   NOREEN ON 01/05/2024] vancomycin   125 mg Oral Q3 days   acetaminophen , guaiFENesin -dextromethorphan , hydrALAZINE , ondansetron  (ZOFRAN ) IV, phenol, traMADol , traZODone   Assessment/ Plan:  Sheryl Kennedy is a 78 y.o.  female  with hypertension, diastolic heart failure, hypothyroidism, gout, hyponatremia, C. difficile, skin cancers, hyperlipidemia, who was admitted to [facility] on 11/27/2023 for Hyponatremia [E87.1] Hematoma of scalp, initial encounter [S00.03XA] Fall, initial encounter [W19.XXXA]   Hyponatremia, appears secondary to increased free water intake with poor solute consumption. Sodium 119 on admission. Initially received 3% hypertonic saline, but this was stopped once sodium reached 133. Update: Serum sodium now normalized at 135.  Hold off on salt tabs at this time.  2. Chronic diastolic heart failure, Last ECHO from 12/30/21 shows EF 50%.   3. Hypertension, essential. Home regimen includes metoprolol , torsemide, and spironolactone-hydrochlorothiazide.  Continue just metoprolol  at this time.    LOS: 3 Wyona Neils 11/27/202510:51 AM

## 2023-11-30 NOTE — Progress Notes (Signed)
 PROGRESS NOTE    Sheryl Kennedy  FMW:[id number] DOB: 1945-11-19 DOA: 11/27/2023 PCP: Auston Reyes [name], MD    Brief Narrative:  78 y.o. female with medical history significant of HTN, HLD, dCHF, hypothyroidism, gout, CKD-3B, chronic pain, hyponatremia, C. difficile colitis, varicose vein, skin cancer, SDH, who presents with weakness, fall, diarrhea.   Patient states that she has generalized weakness in the past 3 days.  She fell yesterday when she was walking in her kitchen, no LOC.  She injured the back of her head.  She has mild headache, no neck pain.  Patient states she had left facial numbness after fall which has resolved. Currently no unilateral numbness or tingling in extremities.  No facial droop or slurred speech.  Patient does not have chest pain, cough, SOB.  Patient states that she has chronic diarrhea for more than 6 months.  Her diarrhea has worsened in the past several days.  She has had 3 watery diarrhea this evening.  No nausea, vomiting, abdominal pain.  No fever or chills.  No symptoms of UTI. Pt has hx of SDH. She had follow-up with neurosurgery NP, Edsel Goods today, who recommended CT of head and MRI of brain.   Assessment & Plan:   Principal Problem:   Hyponatremia Active Problems:   Hypomagnesemia   Fall at home, initial encounter   Left facial numbness   SDH (subdural hematoma) (HCC)   Essential hypertension   Chronic diastolic CHF (congestive heart failure) (HCC)   Diarrhea   Leukocytosis   Gout   CKD stage 3b, GFR 30-44 ml/min (HCC)   Hyperlipidemia   Chronic pain   Hypothyroidism   Clostridium difficile diarrhea  # Recurrent C. difficile diarrhea Stool C. difficile positive.  This is multiple times that the patient has been affected by C. difficile colitis.  She has been through multiple rounds of p.o. vancomycin  and Dificid  without good result. Plan: Placed on 6-week vancomycin  taper per infectious disease.  Will continue.  Continue daily  probiotic.  Continue fiber supplementation   # Hypotonic hyponatremia, symptomatic Na 119 on admission, serum osmolality 248, low S/p hypertonic saline 3% given and patient was admitted in the ICU Na 119>>133 repleted quickly, discontinued 3% saline Nephrology consulted for further management Sodium stabilized at 135.  Stop salt tablets     # Hypomagnesemia, mag repleted. Monitor electrolytes daily and replete as needed.   # Subdural hematoma and subarachnoid hemorrhage status post fall MRI reviewed Neurosurgery consulted No acute neurosurgical management recommended   # Hypertension, chronic diastolic CHF, HLD No evidence of exacerbation Continue Toprol -XL Hold diuretics   # Hypothyroid: Synthroid      DVT prophylaxis: SCDs Code Status: Full Family Communication: Spouse at bedside 11/26, 11/27 Disposition Plan: Status is: Inpatient Remains inpatient appropriate because: Multiple acute issues as above   Level of care: Telemetry  Consultants:  ID Nephrology  Procedures:  None  Antimicrobials: None   Subjective: Examined.  Resting in bed.  No distress.  No complaints  Objective: Vitals:   11/29/23 2111 11/30/23 0026 11/30/23 0406 11/30/23 0746  BP: (!) 115/58 (!) 118/59 119/63 121/62  Pulse: 86 77 72 83  Resp:    18  Temp: 97.8 F (36.6 C) 98.1 F (36.7 C) 98.3 F (36.8 C) 98.4 F (36.9 C)  TempSrc: Oral   Oral  SpO2: 96% 99% 96% 99%  Weight:      Height:        Intake/Output Summary (Last 24 hours) at 11/30/2023 1209  Last data filed at 11/30/2023 0900 Gross per 24 hour  Intake 1380 ml  Output 400 ml  Net 980 ml   Filed Weights   11/27/23 1712 11/28/23 0852 11/29/23 0500  Weight: 57.2 kg 54.7 kg 57.7 kg    Examination:  General exam: No acute distress Respiratory system: Clear to auscultation. Respiratory effort normal. Cardiovascular system: S1-S2, RRR, no murmurs, no pedal edema Gastrointestinal system: Soft, nondistended, nontender,  normal bowel sounds Central nervous system: Alert and oriented. No focal neurological deficits. Extremities: Symmetric 5 x 5 power. Skin: No rashes, lesions or ulcers Psychiatry: Judgement and insight appear normal. Mood & affect appropriate.     Data Reviewed: I have personally reviewed following labs and imaging studies  CBC: Recent Labs  Lab 11/27/23 1716 11/28/23 0811 11/29/23 0339 11/30/23 0300  WBC 14.5* 9.4 11.3* 8.7  HGB 11.8* 11.7* 11.4* 10.6*  HCT 34.0* 34.7* 33.5* 31.1*  MCV 87.9 89.0 88.2 90.1  PLT 318 332 315 315   Basic Metabolic Panel: Recent Labs  Lab 11/27/23 1716 11/28/23 0252 11/28/23 1116 11/28/23 1452 11/29/23 0339 11/29/23 0840 11/29/23 1843 11/29/23 2320 11/30/23 0300 11/30/23 0716 11/30/23 1052  NA 119*   < > 133*   < > 134*   < > 130* 133* 133* 135 135  K 4.9  --  3.7  --  3.9  --   --   --  3.7  --   --   CL 87*  --  98  --  101  --   --   --  101  --   --   CO2 24  --  25  --  24  --   --   --  24  --   --   GLUCOSE 110*  --  120*  --  87  --   --   --  89  --   --   BUN 13  --  8  --  11  --   --   --  10  --   --   CREATININE 0.78  --  0.78  --  0.82  --   --   --  0.78  --   --   CALCIUM 9.9  --  9.7  --  9.4  --   --   --  9.3  --   --   MG 1.3*  --  1.9  --  1.8  --   --   --  1.6*  --   --   PHOS 2.7  --  2.6  --  2.0*  --   --   --  2.0*  --   --    < > = values in this interval not displayed.   GFR: Estimated Creatinine Clearance: 46.9 mL/min (by C-G formula based on SCr of 0.78 mg/dL). Liver Function Tests: Recent Labs  Lab 11/27/23 1716  AST 47*  ALT 24  ALKPHOS 106  BILITOT 0.4  PROT 5.9*  ALBUMIN 3.7   No results for input(s): LIPASE, AMYLASE in the last 168 hours. No results for input(s): AMMONIA in the last 168 hours. Coagulation Profile: Recent Labs  Lab 11/28/23 0955  INR 1.0   Cardiac Enzymes: No results for input(s): CKTOTAL, CKMB, CKMBINDEX, TROPONINI in the last 168 hours. BNP (last 3  results) Recent Labs    11/27/23 1716  PROBNP 570.0*   HbA1C: No results for input(s): HGBA1C in the last 72 hours. CBG:  Recent Labs  Lab 11/28/23 0907  GLUCAP 77   Lipid Profile: No results for input(s): CHOL, HDL, LDLCALC, TRIG, CHOLHDL, LDLDIRECT in the last 72 hours. Thyroid Function Tests: No results for input(s): TSH, T4TOTAL, FREET4, T3FREE, THYROIDAB in the last 72 hours. Anemia Panel: Recent Labs    11/28/23 1116  FOLATE >20.0  TIBC 277  IRON 28   Sepsis Labs: No results for input(s): PROCALCITON, LATICACIDVEN in the last 168 hours.  Recent Results (from the past 240 hours)  MRSA Next Gen by PCR, Nasal     Status: None   Collection Time: 11/28/23  8:01 AM   Specimen: Nasal Mucosa; Nasal Swab  Result Value Ref Range Status   MRSA by PCR Next Gen NOT DETECTED NOT DETECTED Final    Comment: (NOTE) The GeneXpert MRSA Assay (FDA approved for NASAL specimens only), is one component of a comprehensive MRSA colonization surveillance program. It is not intended to diagnose MRSA infection nor to guide or monitor treatment for MRSA infections. Test performance is not FDA approved in patients less than 49 years old. Performed at [facility], [address] [place], [place] [address]   Gastrointestinal Panel by PCR , Stool     Status: None   Collection Time: 11/28/23 10:39 AM   Specimen: Stool  Result Value Ref Range Status   Campylobacter species NOT DETECTED NOT DETECTED Final   Plesimonas shigelloides NOT DETECTED NOT DETECTED Final   Salmonella species NOT DETECTED NOT DETECTED Final   Yersinia enterocolitica NOT DETECTED NOT DETECTED Final   Vibrio species NOT DETECTED NOT DETECTED Final   Vibrio cholerae NOT DETECTED NOT DETECTED Final   Enteroaggregative E coli (EAEC) NOT DETECTED NOT DETECTED Final   Enteropathogenic E coli (EPEC) NOT DETECTED NOT DETECTED Final   Enterotoxigenic E coli (ETEC) NOT DETECTED NOT  DETECTED Final   Shiga like toxin producing E coli (STEC) NOT DETECTED NOT DETECTED Final   Shigella/Enteroinvasive E coli (EIEC) NOT DETECTED NOT DETECTED Final   Cryptosporidium NOT DETECTED NOT DETECTED Final   Cyclospora cayetanensis NOT DETECTED NOT DETECTED Final   Entamoeba histolytica NOT DETECTED NOT DETECTED Final   Giardia lamblia NOT DETECTED NOT DETECTED Final   Adenovirus F40/41 NOT DETECTED NOT DETECTED Final   Astrovirus NOT DETECTED NOT DETECTED Final   Norovirus GI/GII NOT DETECTED NOT DETECTED Final   Rotavirus A NOT DETECTED NOT DETECTED Final   Sapovirus (I, II, IV, and V) NOT DETECTED NOT DETECTED Final    Comment: Performed at [facility], [address] [place], [place] [address]  C Difficile Quick Screen w PCR reflex     Status: Abnormal   Collection Time: 11/28/23 10:39 AM   Specimen: STOOL  Result Value Ref Range Status   C Diff antigen POSITIVE (A) NEGATIVE Final   C Diff toxin POSITIVE (A) NEGATIVE Final   C Diff interpretation Toxin producing C. difficile detected.  Final    Comment: CRITICAL RESULT CALLED TO, READ BACK BY AND VERIFIED WITH: SULA GINS RN 1140 11/28/23 HNM Performed at [facility], [address] [place], [place] [address]          Radiology Studies: No results found.       Scheduled Meds:  allopurinol   300 mg Oral Daily   Chlorhexidine  Gluconate Cloth  6 each Topical Daily   levothyroxine   50 mcg Oral Q0600   metoprolol  succinate  25 mg Oral Daily   multivitamin with minerals  1 tablet Oral  Daily   phosphorus  500 mg Oral Q4H   psyllium  1 packet Oral Daily   saccharomyces boulardii  250 mg Oral BID   simvastatin   20 mg Oral QHS   vancomycin   125 mg Oral QID   Followed by   NOREEN ON 12/13/2023] vancomycin   125 mg Oral BID   Followed by   NOREEN ON 12/21/2023] vancomycin   125 mg Oral Daily   Followed by   NOREEN ON 12/28/2023] vancomycin   125 mg Oral QODAY   Followed by   NOREEN ON  01/05/2024] vancomycin   125 mg Oral Q3 days   Continuous Infusions:   LOS: 3 days    Calvin KATHEE Robson, MD [facility]   If 7PM-7AM, please contact night-coverage  11/30/2023, 12:09 PM

## 2023-11-30 NOTE — Plan of Care (Signed)
   Problem: Activity: Goal: Risk for activity intolerance will decrease Outcome: Progressing   Problem: Nutrition: Goal: Adequate nutrition will be maintained Outcome: Progressing   Problem: Pain Managment: Goal: General experience of comfort will improve and/or be controlled Outcome: Progressing   Problem: Safety: Goal: Ability to remain free from injury will improve Outcome: Progressing

## 2023-11-30 NOTE — Progress Notes (Deleted)
 Patient transported to ultrasound in bed in stable condition at 1019.  Patient returned from ultrasound in bed at 1101 in stable condition.

## 2023-12-01 ENCOUNTER — Other Ambulatory Visit: Payer: Self-pay

## 2023-12-01 DIAGNOSIS — E871 Hypo-osmolality and hyponatremia: Secondary | ICD-10-CM | POA: Diagnosis not present

## 2023-12-01 LAB — OVA + PARASITE EXAM

## 2023-12-01 LAB — CBC
HCT: 31.3 % — ABNORMAL LOW (ref 36.0–46.0)
Hemoglobin: 10.7 g/dL — ABNORMAL LOW (ref 12.0–15.0)
MCH: 30.1 pg (ref 26.0–34.0)
MCHC: 34.2 g/dL (ref 30.0–36.0)
MCV: 87.9 fL (ref 80.0–100.0)
Platelets: 324 K/uL (ref 150–400)
RBC: 3.56 MIL/uL — ABNORMAL LOW (ref 3.87–5.11)
RDW: 15.9 % — ABNORMAL HIGH (ref 11.5–15.5)
WBC: 7.5 K/uL (ref 4.0–10.5)
nRBC: 0 % (ref 0.0–0.2)

## 2023-12-01 LAB — BASIC METABOLIC PANEL WITH GFR
Anion gap: 9 (ref 5–15)
BUN: 8 mg/dL (ref 8–23)
CO2: 25 mmol/L (ref 22–32)
Calcium: 9.2 mg/dL (ref 8.9–10.3)
Chloride: 104 mmol/L (ref 98–111)
Creatinine, Ser: 0.72 mg/dL (ref 0.44–1.00)
GFR, Estimated: >60 mL/min (ref >60)
Glucose, Bld: 90 mg/dL (ref 70–99)
Potassium: 3.4 mmol/L — ABNORMAL LOW (ref 3.5–5.1)
Sodium: 138 mmol/L (ref 135–145)

## 2023-12-01 LAB — MAGNESIUM: Magnesium: 1.9 mg/dL (ref 1.7–2.4)

## 2023-12-01 LAB — PHOSPHORUS: Phosphorus: 2.6 mg/dL (ref 2.5–4.6)

## 2023-12-01 LAB — O&P RESULT

## 2023-12-01 MED ORDER — NUTRISOURCE FIBER PO PACK: 1.0000 | PACK | Freq: Two times a day (BID) | ORAL | Status: AC

## 2023-12-01 MED ORDER — VANCOMYCIN HCL 125 MG PO CAPS
ORAL_CAPSULE | ORAL | 0 refills | Status: AC
  Filled 2023-12-01: qty 40, 10d supply, fill #0
  Filled 2023-12-04: qty 38, 28d supply, fill #1
  Filled 2023-12-09: qty 38, 10d supply, fill #1
  Filled 2023-12-09 (×2): qty 38, 19d supply, fill #1

## 2023-12-01 MED ORDER — ENSURE PLUS HIGH PROTEIN PO LIQD
237.0000 mL | Freq: Two times a day (BID) | ORAL | Status: DC
  Administered 2023-12-01: 237 mL via ORAL

## 2023-12-01 NOTE — Discharge Instructions (Signed)
 [facility] They will call you to set up when they will come to your home   [facility] Address: [address], [place], [place] [address] Hours:  Open ? Closes 4?PM Phone: [phone]

## 2023-12-01 NOTE — TOC Progression Note (Signed)
 Transition of Care [facility]) - Progression Note    Patient Details  Name: KYTZIA GIENGER MRN: [id number] Date of Birth: 1945-03-13  Transition of Care [facility]) CM/SW Contact  Daved [name] Hamilton, RN Phone Number: 12/01/2023, 1:18 PM  Clinical Narrative:     Spoke with patient introduced self and discussed choice for [facility] agency. Patient chooses [facility], selected and completed in HUB, notified Larraine with [facility] of patients selection and d/c today. D/C summary not in at the time of this documentation, will send in HUB once complete.                     Expected Discharge Plan and Services         Expected Discharge Date: 12/01/23                                     Social Drivers of Health (SDOH) Interventions SDOH Screenings   Food Insecurity: No Food Insecurity (11/28/2023)  Housing: Low Risk  (11/28/2023)  Transportation Needs: No Transportation Needs (11/28/2023)  Utilities: Not At Risk (11/28/2023)  Financial Resource Strain: Medium Risk (07/25/2023)   Received from [facility]  Social Connections: Socially Integrated (11/28/2023)  Tobacco Use: Low Risk  (11/27/2023)    Readmission Risk Interventions    11/29/2023   12:11 PM 10/29/2023    2:10 PM 10/27/2023    3:36 PM  Readmission Risk Prevention Plan  Post Dischage Appt   Complete  Medication Screening   Complete  Transportation Screening Complete Complete Complete  PCP or Specialist Appt within 5-7 Days Complete Complete   Home Care Screening Complete Complete   Medication Review (RN CM) Complete Complete

## 2023-12-01 NOTE — Progress Notes (Signed)
 Occupational Therapy Treatment Patient Details Name: Sheryl Kennedy MRN: [id number] DOB: 05/03/1945 Today's Date: 12/01/2023   History of present illness 78 y.o. female with medical history significant of HTN, HLD, dCHF, hypothyroidism, gout, CKD-3B, chronic pain, hyponatremia, C. difficile colitis, varicose vein, skin cancer, recent SDH with admission ~40mo ago, who presents with weakness, fall, diarrhea; admitted for management of acute hyponatremia.   OT comments  Pt received sitting EOB, reports no pain and is agreeable to OT session. Pt making good progress towards goals, is able to complete ADL and mobility tasks with close supervision using RW. OT educates on appropriate DME including use of TTB for energy conservation and safety during showers at home, pt and spouse verbalize understanding. Pt able to complete mobility t/f bathroom for toileting needs close supervision, slow pace compared to baseline per pt. Discharge recommendation updated, pt will benefit from [facility] OT at hospital discharge.       If plan is discharge home, recommend the following:  A little help with walking and/or transfers;A little help with bathing/dressing/bathroom;Assistance with cooking/housework;Assist for transportation;Help with stairs or ramp for entrance   Equipment Recommendations  BSC/3in1;Tub/shower bench       Precautions / Restrictions Precautions Precautions: Fall Recall of Precautions/Restrictions: Intact Restrictions Weight Bearing Restrictions Per Provider Order: No       Mobility Bed Mobility Overal bed mobility: Modified Independent             General bed mobility comments: recieved sitting EOB, no difficulties with sit > supine, no use of bed features    Transfers Overall transfer level: Needs assistance Equipment used: Rolling walker (2 wheels) Transfers: Sit to/from Stand Sit to Stand: Supervision           General transfer comment: supervison for STS from bed, CGA for  toilet transfer     Balance Overall balance assessment: Needs assistance Sitting-balance support: No upper extremity supported, Feet supported Sitting balance-Leahy Scale: Good     Standing balance support: Bilateral upper extremity supported Standing balance-Leahy Scale: Fair Standing balance comment: fair+ static, fair- dynamic with RW                           ADL either performed or assessed with clinical judgement   ADL Overall ADL's : Needs assistance/impaired     Grooming: Wash/dry hands;Standing;Contact guard assist Grooming Details (indicate cue type and reason): sink level in bathroom with no UE support, no LOB                 Toilet Transfer: Supervision/safety;Contact guard assist;Rolling walker (2 wheels);Ambulation Toilet Transfer Details (indicate cue type and reason): t/f bathroom using RW, pt with good safety awareness throughout Toileting- Clothing Manipulation and Hygiene: Supervision/safety;Sit to/from stand Toileting - Clothing Manipulation Details (indicate cue type and reason): no LOB during dynamic reaching tasks. able to manage clothing and reach outside BOS to flush toilet without LOB, good control and use of GB/RW as needed     Functional mobility during ADLs: Supervision/safety;Contact guard assist;Rolling walker (2 wheels) General ADL Comments: pt able to mobilize t/f bathroom with RW, supervision-CGA. No LOB throughout, slow     Communication Communication Communication: No apparent difficulties   Cognition Arousal: Alert Behavior During Therapy: WFL for tasks assessed/performed Cognition: No apparent impairments                               Following  commands: Intact        Cueing   Cueing Techniques: Verbal cues  Exercises Exercises: Other exercises Other Exercises Other Exercises: edu on TTB, provided with video demonstration of use with HH shower head Other Exercises: discussed fall prevention  strategies.            Pertinent Vitals/ Pain       Pain Assessment Pain Assessment: No/denies pain         Frequency  Min 2X/week        Progress Toward Goals  OT Goals(current goals can now be found in the care plan section)  Progress towards OT goals: Progressing toward goals  Acute Rehab OT Goals OT Goal Formulation: With patient/family Time For Goal Achievement: 12/12/23 Potential to Achieve Goals: Good ADL Goals Pt Will Perform Lower Body Dressing: with modified independence;with adaptive equipment;sitting/lateral leans;sit to/from stand Pt Will Transfer to Toilet: with modified independence;ambulating;regular height toilet Pt Will Perform Toileting - Clothing Manipulation and hygiene: with modified independence;sit to/from stand;sitting/lateral leans Additional ADL Goal #1: Pt will verbalize plan to implement at least 2 learned falls prevention strategies into daily routines to minimize falls risk. Additional ADL Goal #2: Pt will verbalize plan to implement at least 2 learned energy conservation strategies into daily routines to minimize falls risk and support ADL/IADL independence.  Plan         AM-PAC OT 6 Clicks Daily Activity     Outcome Measure   Help from another person eating meals?: None Help from another person taking care of personal grooming?: None Help from another person toileting, which includes using toliet, bedpan, or urinal?: A Little Help from another person bathing (including washing, rinsing, drying)?: A Little Help from another person to put on and taking off regular upper body clothing?: None Help from another person to put on and taking off regular lower body clothing?: A Little 6 Click Score: 21    End of Session Equipment Utilized During Treatment: Rolling walker (2 wheels)  OT Visit Diagnosis: Unsteadiness on feet (R26.81);Repeated falls (R29.6);Muscle weakness (generalized) (M62.81)   Activity Tolerance Patient tolerated  treatment well   Patient Left in bed;with call bell/phone within reach;with family/visitor present;with bed alarm set   Nurse Communication Mobility status        Time: 9099-9082 OT Time Calculation (min): 17 min  Charges: OT General Charges $OT Visit: 1 Visit OT Treatments $Self Care/Home Management : 8-22 mins  Logan Vegh L. Philomene Haff, OTR/L  12/01/23, 12:18 PM

## 2023-12-01 NOTE — Progress Notes (Addendum)
 [facility]  ROUNDING NOTE   Subjective:   Patient sitting up in bed Husband at bedside Tolerating small meals  S Sodium 138  Objective:  Vital signs in last 24 hours:  Temp:  [98 F (36.7 C)-98.3 F (36.8 C)] 98 F (36.7 C) (11/28 0825) Pulse Rate:  [75-88] 88 (11/28 0825) Resp:  [16-18] 18 (11/28 0825) BP: (118-136)/(59-76) 131/68 (11/28 0825) SpO2:  [100 %] 100 % (11/28 0825) Weight:  [58.7 kg] 58.7 kg (11/28 0500)  Weight change:  Filed Weights   11/28/23 0852 11/29/23 0500 12/01/23 0500  Weight: 54.7 kg 57.7 kg 58.7 kg    Intake/Output: I/O last 3 completed shifts: In: 480 [P.O.:480] Out: -    Intake/Output this shift:  Total I/O In: 240 [P.O.:240] Out: -   Physical Exam: General: NAD  Head: Normocephalic, atraumatic. Moist oral mucosal membranes  Eyes: Anicteric  Lungs:  Clear to auscultation, normal effort  Heart: Regular rate and rhythm  Abdomen:  Soft, nontender  Extremities: No peripheral edema.  Neurologic: Awake, alert, conversant  Skin: Warm,dry, no rash       Basic Metabolic Panel: Recent Labs  Lab 11/27/23 1716 11/28/23 0252 11/28/23 1116 11/28/23 1452 11/29/23 0339 11/29/23 0840 11/29/23 2320 11/30/23 0300 11/30/23 0716 11/30/23 1052 12/01/23 0548  NA 119*   < > 133*   < > 134*   < > 133* 133* 135 135 138  K 4.9  --  3.7  --  3.9  --   --  3.7  --   --  3.4*  CL 87*  --  98  --  101  --   --  101  --   --  104  CO2 24  --  25  --  24  --   --  24  --   --  25  GLUCOSE 110*  --  120*  --  87  --   --  89  --   --  90  BUN 13  --  8  --  11  --   --  10  --   --  8  CREATININE 0.78  --  0.78  --  0.82  --   --  0.78  --   --  0.72  CALCIUM 9.9  --  9.7  --  9.4  --   --  9.3  --   --  9.2  MG 1.3*  --  1.9  --  1.8  --   --  1.6*  --   --  1.9  PHOS 2.7  --  2.6  --  2.0*  --   --  2.0*  --   --  2.6   < > = values in this interval not displayed.    Liver Function Tests: Recent Labs  Lab 11/27/23 1716  AST  47*  ALT 24  ALKPHOS 106  BILITOT 0.4  PROT 5.9*  ALBUMIN 3.7   No results for input(s): LIPASE, AMYLASE in the last 168 hours. No results for input(s): AMMONIA in the last 168 hours.  CBC: Recent Labs  Lab 11/27/23 1716 11/28/23 0811 11/29/23 0339 11/30/23 0300 12/01/23 0548  WBC 14.5* 9.4 11.3* 8.7 7.5  HGB 11.8* 11.7* 11.4* 10.6* 10.7*  HCT 34.0* 34.7* 33.5* 31.1* 31.3*  MCV 87.9 89.0 88.2 90.1 87.9  PLT 318 332 315 315 324    Cardiac Enzymes: No results for input(s): CKTOTAL, CKMB, CKMBINDEX, TROPONINI in the last  168 hours.  BNP: Invalid input(s): POCBNP  CBG: Recent Labs  Lab 11/28/23 0907  GLUCAP 77    Microbiology: Results for orders placed or performed during the hospital encounter of 11/27/23  MRSA Next Gen by PCR, Nasal     Status: None   Collection Time: 11/28/23  8:01 AM   Specimen: Nasal Mucosa; Nasal Swab  Result Value Ref Range Status   MRSA by PCR Next Gen NOT DETECTED NOT DETECTED Final    Comment: (NOTE) The GeneXpert MRSA Assay (FDA approved for NASAL specimens only), is one component of a comprehensive MRSA colonization surveillance program. It is not intended to diagnose MRSA infection nor to guide or monitor treatment for MRSA infections. Test performance is not FDA approved in patients less than 71 years old. Performed at [facility], [address] [place], [place] [address]   Gastrointestinal Panel by PCR , Stool     Status: None   Collection Time: 11/28/23 10:39 AM   Specimen: Stool  Result Value Ref Range Status   Campylobacter species NOT DETECTED NOT DETECTED Final   Plesimonas shigelloides NOT DETECTED NOT DETECTED Final   Salmonella species NOT DETECTED NOT DETECTED Final   Yersinia enterocolitica NOT DETECTED NOT DETECTED Final   Vibrio species NOT DETECTED NOT DETECTED Final   Vibrio cholerae NOT DETECTED NOT DETECTED Final   Enteroaggregative E coli (EAEC) NOT DETECTED NOT DETECTED Final    Enteropathogenic E coli (EPEC) NOT DETECTED NOT DETECTED Final   Enterotoxigenic E coli (ETEC) NOT DETECTED NOT DETECTED Final   Shiga like toxin producing E coli (STEC) NOT DETECTED NOT DETECTED Final   Shigella/Enteroinvasive E coli (EIEC) NOT DETECTED NOT DETECTED Final   Cryptosporidium NOT DETECTED NOT DETECTED Final   Cyclospora cayetanensis NOT DETECTED NOT DETECTED Final   Entamoeba histolytica NOT DETECTED NOT DETECTED Final   Giardia lamblia NOT DETECTED NOT DETECTED Final   Adenovirus F40/41 NOT DETECTED NOT DETECTED Final   Astrovirus NOT DETECTED NOT DETECTED Final   Norovirus GI/GII NOT DETECTED NOT DETECTED Final   Rotavirus A NOT DETECTED NOT DETECTED Final   Sapovirus (I, II, IV, and V) NOT DETECTED NOT DETECTED Final    Comment: Performed at [facility], [address] [place], [place] [address]  C Difficile Quick Screen w PCR reflex     Status: Abnormal   Collection Time: 11/28/23 10:39 AM   Specimen: STOOL  Result Value Ref Range Status   C Diff antigen POSITIVE (A) NEGATIVE Final   C Diff toxin POSITIVE (A) NEGATIVE Final   C Diff interpretation Toxin producing C. difficile detected.  Final    Comment: CRITICAL RESULT CALLED TO, READ BACK BY AND VERIFIED WITH: SULA GINS RN 1140 11/28/23 HNM Performed at [facility], [address] [place], [place] [address]     Coagulation Studies: No results for input(s): LABPROT, INR in the last 72 hours.   Urinalysis: No results for input(s): COLORURINE, LABSPEC, PHURINE, GLUCOSEU, HGBUR, BILIRUBINUR, KETONESUR, PROTEINUR, UROBILINOGEN, NITRITE, LEUKOCYTESUR in the last 72 hours.  Invalid input(s): APPERANCEUR     Imaging: No results found.    Medications:      allopurinol   300 mg Oral Daily   Chlorhexidine  Gluconate Cloth  6 each Topical Daily   feeding supplement  237 mL Oral BID BM   fiber  1 packet Oral BID   levothyroxine   50 mcg Oral Q0600    metoprolol  succinate  25 mg Oral Daily   multivitamin with  minerals  1 tablet Oral Daily   saccharomyces boulardii  250 mg Oral BID   simvastatin   20 mg Oral QHS   vancomycin   125 mg Oral QID   Followed by   NOREEN ON 12/13/2023] vancomycin   125 mg Oral BID   Followed by   NOREEN ON 12/21/2023] vancomycin   125 mg Oral Daily   Followed by   NOREEN ON 12/28/2023] vancomycin   125 mg Oral QODAY   Followed by   NOREEN ON 01/05/2024] vancomycin   125 mg Oral Q3 days   acetaminophen , guaiFENesin -dextromethorphan , hydrALAZINE , ondansetron  (ZOFRAN ) IV, phenol, traMADol , traZODone   Assessment/ Plan:  Ms. JACKQUELINE AQUILAR is a 78 y.o.  female  with hypertension, diastolic heart failure, hypothyroidism, gout, hyponatremia, C. difficile, skin cancers, hyperlipidemia, who was admitted to [facility] on 11/27/2023 for Hyponatremia [E87.1] Hematoma of scalp, initial encounter [S00.03XA] Fall, initial encounter [W19.XXXA]   Hyponatremia, appears secondary to increased free water intake with poor solute consumption. Sodium 119 on admission. Initially received 3% hypertonic saline, but this was stopped once sodium reached 133. Update: Serum sodium 138 today. Continue to encourage oral intake with fluid restriction of 40 oz.   2. Chronic diastolic heart failure, Last ECHO from 12/30/21 shows EF 50%.   3. Hypertension, essential. Home regimen includes metoprolol , torsemide, and spironolactone-hydrochlorothiazide.  Continue  metoprolol , blood pressure well controlled.  We will sign off at this time.   LOS: 4 Sheryl Kennedy 11/28/202510:27 AM

## 2023-12-01 NOTE — Progress Notes (Signed)
 Physical Therapy Treatment Patient Details Name: ANNALIA METZGER MRN: [id number] DOB: September 28, 1945 Today's Date: 12/01/2023   History of Present Illness 78 y.o. female with medical history significant of HTN, HLD, dCHF, hypothyroidism, gout, CKD-3B, chronic pain, hyponatremia, C. difficile colitis, varicose vein, skin cancer, recent SDH with admission ~35mo ago, who presents with weakness, fall, diarrhea; admitted for management of acute hyponatremia.    PT Comments  Gradual improvement in activity tolerance and overall functional indep with mobility tasks, completing all with RW and close sup.   Good awareness of limits of stability, indep utilizing contralateral UE for additional external stabilization or to extend reach as needed during dynamic activities.  Good safety awareness/insight with all functional tasks  Discharge recs remain appropriate.    If plan is discharge home, recommend the following: A little help with walking and/or transfers;A little help with bathing/dressing/bathroom   Can travel by private vehicle        Equipment Recommendations  None recommended by PT    Recommendations for Other Services       Precautions / Restrictions Precautions Precautions: Fall Restrictions Weight Bearing Restrictions Per Provider Order: No     Mobility  Bed Mobility Overal bed mobility: Modified Independent                  Transfers Overall transfer level: Needs assistance Equipment used: Rolling walker (2 wheels) Transfers: Sit to/from Stand Sit to Stand: Supervision           General transfer comment: cuing for hand placement    Ambulation/Gait Ambulation/Gait assistance: Supervision Gait Distance (Feet): 125 Feet Assistive device: Rolling walker (2 wheels)         General Gait Details: partially reciprocal stepping pattern; significant trendelenburg pattern with excessive hip on femoral abduction in stance (R> L).  Improved activity tolerance,  completing distance in continuous effort without rest break required.   Stairs             Wheelchair Mobility     Tilt Bed    Modified Rankin (Stroke Patients Only)       Balance Overall balance assessment: Needs assistance Sitting-balance support: No upper extremity supported, Feet supported Sitting balance-Leahy Scale: Good     Standing balance support: Bilateral upper extremity supported Standing balance-Leahy Scale: Fair                              Hotel Manager: No apparent difficulties  Cognition Arousal: Alert Behavior During Therapy: WFL for tasks assessed/performed   PT - Cognitive impairments: No family/caregiver present to determine baseline                       PT - Cognition Comments: Alert and oriented, follows commands and agreeable to participation with session Following commands: Intact      Cueing Cueing Techniques: Verbal cues  Exercises Other Exercises Other Exercises: Toilet transfer, ambulatory with RW, close sup; sit/stand from standard toilet, sup/mod indep with grab bar. Other Exercises: Standing balance for peri-care, clothing management, hand hygiene and oral care/light grooming at sink, close sup.  Good awareness of limits of stability, indep utilizing contralateral UE for additional external stabilization or to extend reach as needed.  Good safety awareness/insight with all functional tasks    General Comments        Pertinent Vitals/Pain Pain Assessment Pain Assessment: No/denies pain    Home Living  Prior Function            PT Goals (current goals can now be found in the care plan section) Acute Rehab PT Goals Patient Stated Goal: to return home PT Goal Formulation: With patient Time For Goal Achievement: 12/13/23 Potential to Achieve Goals: Good Progress towards PT goals: Progressing toward goals    Frequency    Min  2X/week      PT Plan      Co-evaluation              AM-PAC PT 6 Clicks Mobility   Outcome Measure  Help needed turning from your back to your side while in a flat bed without using bedrails?: None Help needed moving from lying on your back to sitting on the side of a flat bed without using bedrails?: None Help needed moving to and from a bed to a chair (including a wheelchair)?: None Help needed standing up from a chair using your arms (e.g., wheelchair or bedside chair)?: None Help needed to walk in hospital room?: None Help needed climbing 3-5 steps with a railing? : A Little 6 Click Score: 23    End of Session Equipment Utilized During Treatment: Gait belt Activity Tolerance: Patient tolerated treatment well Patient left: in bed;with call bell/phone within reach;with bed alarm set;with family/visitor present Nurse Communication: Mobility status PT Visit Diagnosis: Muscle weakness (generalized) (M62.81);Difficulty in walking, not elsewhere classified (R26.2)     Time: 9062-9042 PT Time Calculation (min) (ACUTE ONLY): 20 min  Charges:    $Therapeutic Activity: 8-22 mins PT General Charges $$ ACUTE PT VISIT: 1 Visit                    Billie Intriago H. Delores, PT, DPT, NCS 12/01/23, 10:06 AM [phone]

## 2023-12-01 NOTE — Discharge Summary (Signed)
 Physician Discharge Summary  Sheryl Kennedy FMW:[id number] DOB: 01-Jun-1945 DOA: 11/27/2023  PCP: Auston Reyes [name], MD  Admit date: 11/27/2023 Discharge date: 12/01/2023  Admitted From: Home Disposition:  Home w home health  Recommendations for Outpatient Follow-up:  Follow up with PCP in 1-2 weeks Follow up with neurosurgery 1 week Follow up with GI for consideration of fecal transplant  Home Health:Yes  Equipment/Devices:No   Discharge Condition:Stable  CODE STATUS:FULL  Diet recommendation: Reg  Brief/Interim Summary:  78 y.o. female with medical history significant of HTN, HLD, dCHF, hypothyroidism, gout, CKD-3B, chronic pain, hyponatremia, C. difficile colitis, varicose vein, skin cancer, SDH, who presents with weakness, fall, diarrhea.   Patient states that she has generalized weakness in the past 3 days.  She fell yesterday when she was walking in her kitchen, no LOC.  She injured the back of her head.  She has mild headache, no neck pain.  Patient states she had left facial numbness after fall which has resolved. Currently no unilateral numbness or tingling in extremities.  No facial droop or slurred speech.  Patient does not have chest pain, cough, SOB.  Patient states that she has chronic diarrhea for more than 6 months.  Her diarrhea has worsened in the past several days.  She has had 3 watery diarrhea this evening.  No nausea, vomiting, abdominal pain.  No fever or chills.  No symptoms of UTI. Pt has hx of SDH. She had follow-up with neurosurgery NP, Edsel Goods today, who recommended CT of head and MRI of brain.      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  This is multiple times that the patient has been affected by C. difficile colitis.  She has been through multiple rounds of p.o. vancomycin  and Dificid  without good result. Plan: Placed on 6-week vancomycin  taper per infectious disease.  Will continue on discharge.  Continue daily probiotic.  Continue daily fiber supplementation.  Patient will need follow-up with [facility] clinic GI.  Consideration for fecal transplant as outpatient.   # Hypotonic hyponatremia, symptomatic Na 119 on admission, serum osmolality 248, low Recovered to 138 at time of discharge No further recommendations from nephrology      # Subdural hematoma and subarachnoid hemorrhage status post fall MRI reviewed Neurosurgery consulted No acute neurosurgical management recommended Wound wrapped.  Home health ordered for wound care services Notified neurosurgery PA.  Patient will need follow-up in the clinic postdischarge   # Hypertension, chronic diastolic CHF, HLD No evidence of exacerbation Resume home medications   # Hypothyroid: Synthroid   `  Discharge Instructions  Discharge Instructions     Diet - low sodium heart healthy   Complete by: As directed    Discharge wound care:   Complete by: As directed    Wound care  2 times daily      Comments: Cleanse head wound BID with Vashe moistened gauze, Soila # [phone]) then apply Vashe gel (family will bring from home) onto the gauze which is moist with Vashe and leave in  place over the area. Cover with foam dressing to hold in place if possible, if it will not adhere because of the hair, then leave wound gel open to air.   Increase activity slowly   Complete by: As directed       Allergies as of 12/01/2023       Reactions   Ery-tab [erythromycin] Other (See Comments)   Unknown reaction   Fosamax [alendronate]  Other (See Comments)   Possible cause of hypercalcemia   Levaquin [levofloxacin] Other (See Comments)    Dizziness    Aleve [naproxen] Swelling, Other (See Comments)   Arthralgias  Swelling in hands/joints, also   Shellfish Allergy Swelling, Dermatitis, Other (See Comments)   Arthralgias Joint swelling, also Gout flares   Sudafed [pseudoephedrine] Rash, Other (See Comments)   Rhinitis Sneezing   Sulfa Antibiotics Rash        Medication List     PAUSE taking these medications    potassium chloride  SA 20 MEQ tablet Wait to take this until your doctor or other care provider tells you to start again. Commonly known as: KLOR-CON  M Take 20 mEq by mouth 3 (three) times daily.   spironolactone-hydrochlorothiazide 25-25 MG tablet Wait to take this until your doctor or other care provider tells you to start again. Commonly known as: ALDACTAZIDE Take 1 tablet by mouth daily.       STOP taking these medications    fluconazole  100 MG tablet Commonly known as: DIFLUCAN    torsemide 20 MG tablet Commonly known as: DEMADEX       TAKE these medications    acetaminophen  500 MG tablet Commonly known as: TYLENOL  Take 1,000 mg by mouth 2 (two) times daily as needed for fever or headache (pain).   allopurinol  300 MG tablet Commonly known as: ZYLOPRIM  Take 300 mg by mouth daily.   Centrum Silver Women 50+ Tabs Take 1 tablet by mouth daily.   feeding supplement Liqd Take 237 mLs by mouth 2 (two) times daily between meals.   fexofenadine 180 MG tablet Commonly known as: ALLEGRA Take 80 mg by mouth 3 times/day as needed-between meals & bedtime for allergies.   fiber Pack packet Take 1 packet by mouth 2 (two) times daily.   ketoconazole  2 % shampoo Commonly known as: NIZORAL  WASH SCALP 2-3 TIMES WEEKLY, LET SIT 5 MINUTES AND RINSE What changed: See the new instructions.   levothyroxine  50 MCG tablet Commonly known as: SYNTHROID  Take 50 mcg by mouth daily before  breakfast.   metoprolol  succinate 25 MG 24 hr tablet Commonly known as: TOPROL -XL Take 25 mg by mouth daily.   saccharomyces boulardii 250 MG capsule Commonly known as: FLORASTOR Take 1 capsule (250 mg total) by mouth 2 (two) times daily.   simvastatin  20 MG tablet Commonly known as: ZOCOR  Take 20 mg by mouth at bedtime.   traMADol  50 MG tablet Commonly known as: ULTRAM  Take 50 mg by mouth every 12 (twelve) hours as needed.   traZODone  50 MG tablet Commonly known as: DESYREL  Take 25 mg by mouth at bedtime as needed for sleep.   vancomycin  125 MG capsule Commonly known as: VANCOCIN  Take 1 capsule (125 mg total) by mouth 4 (four) times daily for 13 days, THEN 1 capsule (125 mg total) 2 (two) times daily for 7 days, THEN 1 capsule (125 mg total) daily for 7 days, THEN 1 capsule (125 mg total) every other day for 7 days, THEN 1 capsule (125 mg total) every 3 (three) days  for 7 days. Start taking on: December 01, 2023               Discharge Care Instructions  (From admission, onward)           Start     Ordered   12/01/23 0000  Discharge wound care:       Comments: Wound care  2 times daily      Comments: Cleanse head wound BID with Vashe moistened gauze, Soila # [phone]) then apply Vashe gel (family will bring from home) onto the gauze which is moist with Vashe and leave in place over the area. Cover with foam dressing to hold in place if possible, if it will not adhere because of the hair, then leave wound gel open to air.   12/01/23 1248            Contact information for follow-up providers     Sparks, Reyes BIRCH, MD Follow up.   Specialty: Internal Medicine Why: hospital follow up Contact information: [facility] [address] [place] [address] [phone]         Gregory Edsel Ruth, [place]. Schedule an appointment as soon as possible for a visit in 1 week(s).   Specialty: Neurosurgery Contact information: [address] [place] [address] [phone]         Jane Delmar Pike, NP. Schedule an appointment as soon as possible for a visit in 1 week(s).   Specialty: Gastroenterology Contact information: [address] [facility] [place] [place] [address] [phone]              Contact information for after-discharge care     Home Medical Care     [facility]) .   Service: Home Health Services Contact information: [phone] [place]  [phone]                    Allergies  Allergen Reactions   Ery-Tab [Erythromycin] Other (See Comments)    Unknown reaction   Fosamax [Alendronate] Other (See Comments)    Possible cause of hypercalcemia   Levaquin [Levofloxacin] Other (See Comments)     Dizziness    Aleve [Naproxen] Swelling and Other (See Comments)    Arthralgias  Swelling in hands/joints, also   Shellfish Allergy Swelling, Dermatitis and Other (See Comments)    Arthralgias Joint swelling, also Gout flares   Sudafed [Pseudoephedrine] Rash and Other (See Comments)    Rhinitis Sneezing   Sulfa Antibiotics Rash    Consultations: ID Neurosurgery   Procedures/Studies: MR BRAIN WO CONTRAST Result Date: 11/28/2023 EXAM: MRI Brain Without Contrast 11/28/2023 12:58:52 AM TECHNIQUE: Multiplanar multisequence MRI of the head/brain was performed without the administration of intravenous contrast. COMPARISON: CT head 11/27/2023 CLINICAL HISTORY: Transient ischemic attack (TIA) FINDINGS: BRAIN AND VENTRICLES: No acute infarct. Approximately 3 mm thick right cerebral convexity subdural hematoma with small volume of adjacent frontal convexity subarachnoid hemorrhage. No mass. No significant midline shift. No hydrocephalus. The sella is unremarkable. Normal flow voids. ORBITS: No acute abnormality. SINUSES AND MASTOIDS: No acute abnormality. BONES AND SOFT TISSUES: Normal marrow  signal. Right posterior scalp fluid, presumably hematoma. IMPRESSION: 1. Approximately 3 mm thick right cerebral convexity subdural hematoma with small volume of adjacent frontal convexity subarachnoid hemorrhage. 2. No acute infarct. Electronically signed by: Gilmore Molt MD 11/28/2023 01:03 AM EST RP Workstation: [id number]   CT Cervical Spine Wo Contrast Result  Date: 11/27/2023 EXAM: CT CERVICAL SPINE WITHOUT CONTRAST 11/27/2023 08:59:18 PM TECHNIQUE: CT of the cervical spine was performed without the administration of intravenous contrast. Multiplanar reformatted images are provided for review. Automated exposure control, iterative reconstruction, and/or weight based adjustment of the mA/kV was utilized to reduce the radiation dose to as low as reasonably achievable. COMPARISON: Comparison from 11/07/2023. CLINICAL HISTORY: Generalized weakness. FINDINGS: CERVICAL SPINE: BONES AND ALIGNMENT: 7 cervical segments are well visualized. Mild anterolisthesis of C3 on C4 is noted. No acute fracture or acute facet abnormality is noted. DEGENERATIVE CHANGES: Multilevel disc space narrowing is noted throughout the cervical spine. Osteophytic changes and facet hypertrophic changes are seen. SOFT TISSUES: The surrounding soft tissue structures are unremarkable. LUNG APICES: The lung apices show no acute abnormality. IMPRESSION: 1. Chronic appearing degenerative change of the cervical spine without acute abnormality. Electronically signed by: Oneil Devonshire MD 11/27/2023 09:05 PM EST RP Workstation: [id number]   CT HEAD WO CONTRAST ( ) Result Date: 11/27/2023 EXAM: CT HEAD WITHOUT CONTRAST 11/27/2023 08:59:18 PM TECHNIQUE: CT of the head was performed without the administration of intravenous contrast. Automated exposure control, iterative reconstruction, and/or weight based adjustment of the mA/kV was utilized to reduce the radiation dose to as low as reasonably achievable. COMPARISON: 11/17/2023 CLINICAL HISTORY:  Weakness, recent SDH. FINDINGS: BRAIN AND VENTRICLES: No new focal hemorrhage is seen. There remains a small right subdural hematoma which now measures 2 mm, slightly smaller than that seen on the prior exam. No acute infarct is noted. Mild atrophic changes are noted commensurate with the patient's given age. ORBITS: No acute abnormality. SINUSES: No acute abnormality. SOFT TISSUES AND SKULL: Persistent right posterior parietal scalp hematoma noted. No skull fracture. IMPRESSION: 1. Small right subdural hematoma, 2 mm, slightly decreased from prior, without mass effect. 2. No new intracranial hemorrhage or acute infarct. 3. Persistent right posterior parietal scalp hematoma. Electronically signed by: Oneil Devonshire MD 11/27/2023 09:03 PM EST RP Workstation: [id number]   DG Chest 2 View Result Date: 11/27/2023 EXAM: 2 VIEW(S) XRAY OF THE CHEST 11/27/2023 08:54:00 PM COMPARISON: 11/22/2023 CLINICAL HISTORY: cough, eval for pna FINDINGS: LUNGS AND PLEURA: No focal pulmonary opacity. No pleural effusion. No pneumothorax. HEART AND MEDIASTINUM: Mildly atherosclerotic thoracic aorta. No acute abnormality of the cardiac and mediastinal silhouettes. BONES AND SOFT TISSUES: Degenerative changes throughout thoracic spine. Degenerative changes in bilateral shoulders. High riding left humeral head consistent with rotator cuff disease. No acute osseous abnormality. IMPRESSION: 1. No acute cardiopulmonary abnormality. Electronically signed by: Luke Bun MD 11/27/2023 08:58 PM EST RP Workstation: [id number]   DG Chest Portable 1 View Result Date: 11/22/2023 CLINICAL DATA:  Chest pain. EXAM: PORTABLE CHEST 1 VIEW COMPARISON:  October 06, 2023 FINDINGS: The heart size and mediastinal contours are within normal limits. There is marked severity calcification of the aortic arch peer both lungs are clear. Marked severity degenerative changes are seen involving both shoulders. Multilevel degenerative changes are also present  throughout the thoracic spine. IMPRESSION: No active cardiopulmonary disease. Electronically Signed   By: Suzen Dials M.D.   On: 11/22/2023 20:15   CT HEAD WO CONTRAST ( ) Result Date: 11/17/2023 EXAM: CT HEAD WITHOUT CONTRAST 11/17/2023 01:11:12 PM TECHNIQUE: CT of the head was performed without the administration of intravenous contrast. Automated exposure control, iterative reconstruction, and/or weight based adjustment of the mA/kV was utilized to reduce the radiation dose to as low as reasonably achievable. COMPARISON: 11/07/2023 CLINICAL HISTORY: FINDINGS: BRAIN AND VENTRICLES: Decreased attenuation of the mixed attenuation right subdural hematoma, measuring  up to 6 mm in thickness, unchanged. Stable 2-3 mm leftward midline shift. No evidence of acute infarct. No hydrocephalus. ORBITS: No acute abnormality. SINUSES: No acute abnormality. SOFT TISSUES AND SKULL: Decreased size of the Right posterior convexity scalp hematoma. Redemonstrated chronic vertex soft tissue defect. No skull fracture. IMPRESSION: 1. Stable mixed-attenuation right subdural hematoma measuring up to 6 mm with stable 23 mm leftward midline shift. 2. Decreased size of the right posterior convexity scalp hematoma. Electronically signed by: Ryan Chess MD 11/17/2023 01:19 PM EST RP Workstation: [id number]   CT Head Wo Contrast Result Date: 11/07/2023 EXAM: CT HEAD AND CERVICAL SPINE 11/07/2023 12:09:21 PM TECHNIQUE: CT of the head and cervical spine was performed without the administration of intravenous contrast. Multiplanar reformatted images are provided for review. Automated exposure control, iterative reconstruction, and/or weight based adjustment of the mA/kV was utilized to reduce the radiation dose to as low as reasonably achievable. COMPARISON: CT head and CT cervical spine 10/06/2023 CLINICAL HISTORY: Head trauma, moderate-severe FINDINGS: CT HEAD BRAIN AND VENTRICLES: Acute 5 mm thick right subdural hematoma.  Trace (2 mm) leftward midline shift. No mass effect or midline shift. No abnormal extra-axial fluid collection. No evidence of acute infarct. No hydrocephalus. ORBITS: No acute abnormality. SINUSES AND MASTOIDS: No acute abnormality. SOFT TISSUES AND SKULL: No acute skull fracture. High right scalp contusion. Chronic vertex anterior scalp ulceration. CT CERVICAL SPINE BONES AND ALIGNMENT: No acute fracture or traumatic malalignment. Mild degenerative anterolisthesis of C3 on C4. DEGENERATIVE CHANGES: Moderate to severe mid to lower cervical degenerative disc disease. Facet and uncovertebral hypertrophy with varying degrees of neural foraminal stenosis. SOFT TISSUES: No prevertebral soft tissue swelling. FIndings discussed with Dr. Dorothyann via telephone at 12:14 PM. IMPRESSION: 1. Acute 5 mm thick right subdural hematoma. Trace (2 mm) leftward midline shift. 2. No acute fracture or traumatic malalignment of the cervical spine. 3. Right posterior scalp contusion 4. Chronic vertex anterior scalp ulceration. Electronically signed by: Gilmore Molt MD 11/07/2023 12:23 PM EST RP Workstation: [id number]   CT Cervical Spine Wo Contrast Result Date: 11/07/2023 EXAM: CT HEAD AND CERVICAL SPINE 11/07/2023 12:09:21 PM TECHNIQUE: CT of the head and cervical spine was performed without the administration of intravenous contrast. Multiplanar reformatted images are provided for review. Automated exposure control, iterative reconstruction, and/or weight based adjustment of the mA/kV was utilized to reduce the radiation dose to as low as reasonably achievable. COMPARISON: CT head and CT cervical spine 10/06/2023 CLINICAL HISTORY: Head trauma, moderate-severe FINDINGS: CT HEAD BRAIN AND VENTRICLES: Acute 5 mm thick right subdural hematoma. Trace (2 mm) leftward midline shift. No mass effect or midline shift. No abnormal extra-axial fluid collection. No evidence of acute infarct. No hydrocephalus. ORBITS: No acute abnormality.  SINUSES AND MASTOIDS: No acute abnormality. SOFT TISSUES AND SKULL: No acute skull fracture. High right scalp contusion. Chronic vertex anterior scalp ulceration. CT CERVICAL SPINE BONES AND ALIGNMENT: No acute fracture or traumatic malalignment. Mild degenerative anterolisthesis of C3 on C4. DEGENERATIVE CHANGES: Moderate to severe mid to lower cervical degenerative disc disease. Facet and uncovertebral hypertrophy with varying degrees of neural foraminal stenosis. SOFT TISSUES: No prevertebral soft tissue swelling. FIndings discussed with Dr. Dorothyann via telephone at 12:14 PM. IMPRESSION: 1. Acute 5 mm thick right subdural hematoma. Trace (2 mm) leftward midline shift. 2. No acute fracture or traumatic malalignment of the cervical spine. 3. Right posterior scalp contusion 4. Chronic vertex anterior scalp ulceration. Electronically signed by: Gilmore Molt MD 11/07/2023 12:23 PM EST RP Workstation: [id number]  Subjective: Seen and examined on the day of discharge.  Stable no distress.  Appropriate for discharge home.  Discharge Exam: Vitals:   12/01/23 0431 12/01/23 0825  BP: 130/60 131/68  Pulse: 80 88  Resp: 16 18  Temp: 98.1 F (36.7 C) 98 F (36.7 C)  SpO2: 100% 100%   Vitals:   11/30/23 1949 12/01/23 0431 12/01/23 0500 12/01/23 0825  BP: (!) 118/59 130/60  131/68  Pulse: 75 80  88  Resp: 17 16  18   Temp: 98.1 F (36.7 C) 98.1 F (36.7 C)  98 F (36.7 C)  TempSrc:  Oral    SpO2: 100% 100%  100%  Weight:   58.7 kg   Height:        General: Pt is alert, awake, not in acute distress Cardiovascular: RRR, S1/S2 +, no rubs, no gallops Respiratory: CTA bilaterally, no wheezing, no rhonchi Abdominal: Soft, NT, ND, bowel sounds + Extremities: no edema, no cyanosis    The results of significant diagnostics from this hospitalization (including imaging, microbiology, ancillary and laboratory) are listed below for reference.     Microbiology: Recent Results (from the  past 240 hours)  MRSA Next Gen by PCR, Nasal     Status: None   Collection Time: 11/28/23  8:01 AM   Specimen: Nasal Mucosa; Nasal Swab  Result Value Ref Range Status   MRSA by PCR Next Gen NOT DETECTED NOT DETECTED Final    Comment: (NOTE) The GeneXpert MRSA Assay (FDA approved for NASAL specimens only), is one component of a comprehensive MRSA colonization surveillance program. It is not intended to diagnose MRSA infection nor to guide or monitor treatment for MRSA infections. Test performance is not FDA approved in patients less than 44 years old. Performed at [facility], [address] [place], [place] [address]   Gastrointestinal Panel by PCR , Stool     Status: None   Collection Time: 11/28/23 10:39 AM   Specimen: Stool  Result Value Ref Range Status   Campylobacter species NOT DETECTED NOT DETECTED Final   Plesimonas shigelloides NOT DETECTED NOT DETECTED Final   Salmonella species NOT DETECTED NOT DETECTED Final   Yersinia enterocolitica NOT DETECTED NOT DETECTED Final   Vibrio species NOT DETECTED NOT DETECTED Final   Vibrio cholerae NOT DETECTED NOT DETECTED Final   Enteroaggregative E coli (EAEC) NOT DETECTED NOT DETECTED Final   Enteropathogenic E coli (EPEC) NOT DETECTED NOT DETECTED Final   Enterotoxigenic E coli (ETEC) NOT DETECTED NOT DETECTED Final   Shiga like toxin producing E coli (STEC) NOT DETECTED NOT DETECTED Final   Shigella/Enteroinvasive E coli (EIEC) NOT DETECTED NOT DETECTED Final   Cryptosporidium NOT DETECTED NOT DETECTED Final   Cyclospora cayetanensis NOT DETECTED NOT DETECTED Final   Entamoeba histolytica NOT DETECTED NOT DETECTED Final   Giardia lamblia NOT DETECTED NOT DETECTED Final   Adenovirus F40/41 NOT DETECTED NOT DETECTED Final   Astrovirus NOT DETECTED NOT DETECTED Final   Norovirus GI/GII NOT DETECTED NOT DETECTED Final   Rotavirus A NOT DETECTED NOT DETECTED Final   Sapovirus (I, II, IV, and V) NOT DETECTED NOT DETECTED  Final    Comment: Performed at [facility], [address] [place], [place] [address]  C Difficile Quick Screen w PCR reflex     Status: Abnormal   Collection Time: 11/28/23 10:39 AM   Specimen: STOOL  Result Value Ref Range Status   C Diff antigen POSITIVE (A) NEGATIVE Final   C Diff  toxin POSITIVE (A) NEGATIVE Final   C Diff interpretation Toxin producing C. difficile detected.  Final    Comment: CRITICAL RESULT CALLED TO, READ BACK BY AND VERIFIED WITH: SULA GINS RN 1140 11/28/23 HNM Performed at [facility], [address] [place], [place] [address]   OVA + PARASITE EXAM     Status: None   Collection Time: 11/28/23 10:39 AM   Specimen: Stool  Result Value Ref Range Status   OVA + PARASITE EXAM Final report  Final    Comment: (NOTE) These results were obtained using wet preparation(s) and trichrome stained smear. This test does not include testing for Cryptosporidium parvum, Cyclospora, or Microsporidia. Performed At: [facility] [address] [place], [place] [id number] Cherilyn Maple DASEN MD [phone]    Source of Sample STICKY  Final    Comment: Performed at [facility], [address] [place], [place] [address]     Labs: BNP (last 3 results) No results for input(s): BNP in the last 8760 hours. Basic Metabolic Panel: Recent Labs  Lab 11/27/23 1716 11/28/23 0252 11/28/23 1116 11/28/23 1452 11/29/23 0339 11/29/23 0840 11/29/23 2320 11/30/23 0300 11/30/23 0716 11/30/23 1052 12/01/23 0548  NA 119*   < > 133*   < > 134*   < > 133* 133* 135 135 138  K 4.9  --  3.7  --  3.9  --   --  3.7  --   --  3.4*  CL 87*  --  98  --  101  --   --  101  --   --  104  CO2 24  --  25  --  24  --   --  24  --   --  25  GLUCOSE 110*  --  120*  --  87  --   --  89  --   --  90  BUN 13  --  8  --  11  --   --  10  --   --  8  CREATININE 0.78  --  0.78  --  0.82  --   --  0.78  --   --  0.72  CALCIUM 9.9  --  9.7  --   9.4  --   --  9.3  --   --  9.2  MG 1.3*  --  1.9  --  1.8  --   --  1.6*  --   --  1.9  PHOS 2.7  --  2.6  --  2.0*  --   --  2.0*  --   --  2.6   < > = values in this interval not displayed.   Liver Function Tests: Recent Labs  Lab 11/27/23 1716  AST 47*  ALT 24  ALKPHOS 106  BILITOT 0.4  PROT 5.9*  ALBUMIN 3.7   No results for input(s): LIPASE, AMYLASE in the last 168 hours. No results for input(s): AMMONIA in the last 168 hours. CBC: Recent Labs  Lab 11/27/23 1716 11/28/23 0811 11/29/23 0339 11/30/23 0300 12/01/23 0548  WBC 14.5* 9.4 11.3* 8.7 7.5  HGB 11.8* 11.7* 11.4* 10.6* 10.7*  HCT 34.0* 34.7* 33.5* 31.1* 31.3*  MCV 87.9 89.0 88.2 90.1 87.9  PLT 318 332 315 315 324   Cardiac Enzymes: No results for input(s): CKTOTAL, CKMB, CKMBINDEX, TROPONINI in the last 168 hours. BNP: Invalid input(s): POCBNP CBG: Recent Labs  Lab 11/28/23 0907  GLUCAP 77   D-Dimer  No results for input(s): DDIMER in the last 72 hours. Hgb A1c No results for input(s): HGBA1C in the last 72 hours. Lipid Profile No results for input(s): CHOL, HDL, LDLCALC, TRIG, CHOLHDL, LDLDIRECT in the last 72 hours. Thyroid function studies No results for input(s): TSH, T4TOTAL, T3FREE, THYROIDAB in the last 72 hours.  Invalid input(s): FREET3 Anemia work up No results for input(s): VITAMINB12, FOLATE, FERRITIN, TIBC, IRON, RETICCTPCT in the last 72 hours. Urinalysis    Component Value Date/Time   COLORURINE STRAW (A) 11/27/2023 1716   APPEARANCEUR CLEAR (A) 11/27/2023 1716   APPEARANCEUR Clear 09/18/2013 0919   LABSPEC 1.003 (L) 11/27/2023 1716   LABSPEC 1.006 09/18/2013 0919   PHURINE 6.0 11/27/2023 1716   GLUCOSEU NEGATIVE 11/27/2023 1716   GLUCOSEU Negative 09/18/2013 0919   HGBUR NEGATIVE 11/27/2023 1716   BILIRUBINUR NEGATIVE 11/27/2023 1716   BILIRUBINUR Negative 09/18/2013 0919   KETONESUR NEGATIVE 11/27/2023 1716   PROTEINUR  NEGATIVE 11/27/2023 1716   NITRITE NEGATIVE 11/27/2023 1716   LEUKOCYTESUR NEGATIVE 11/27/2023 1716   LEUKOCYTESUR Trace 09/18/2013 0919   Sepsis Labs Recent Labs  Lab 11/28/23 0811 11/29/23 0339 11/30/23 0300 12/01/23 0548  WBC 9.4 11.3* 8.7 7.5   Microbiology Recent Results (from the past 240 hours)  MRSA Next Gen by PCR, Nasal     Status: None   Collection Time: 11/28/23  8:01 AM   Specimen: Nasal Mucosa; Nasal Swab  Result Value Ref Range Status   MRSA by PCR Next Gen NOT DETECTED NOT DETECTED Final    Comment: (NOTE) The GeneXpert MRSA Assay (FDA approved for NASAL specimens only), is one component of a comprehensive MRSA colonization surveillance program. It is not intended to diagnose MRSA infection nor to guide or monitor treatment for MRSA infections. Test performance is not FDA approved in patients less than 82 years old. Performed at [facility], [address] [place], [place] [address]   Gastrointestinal Panel by PCR , Stool     Status: None   Collection Time: 11/28/23 10:39 AM   Specimen: Stool  Result Value Ref Range Status   Campylobacter species NOT DETECTED NOT DETECTED Final   Plesimonas shigelloides NOT DETECTED NOT DETECTED Final   Salmonella species NOT DETECTED NOT DETECTED Final   Yersinia enterocolitica NOT DETECTED NOT DETECTED Final   Vibrio species NOT DETECTED NOT DETECTED Final   Vibrio cholerae NOT DETECTED NOT DETECTED Final   Enteroaggregative E coli (EAEC) NOT DETECTED NOT DETECTED Final   Enteropathogenic E coli (EPEC) NOT DETECTED NOT DETECTED Final   Enterotoxigenic E coli (ETEC) NOT DETECTED NOT DETECTED Final   Shiga like toxin producing E coli (STEC) NOT DETECTED NOT DETECTED Final   Shigella/Enteroinvasive E coli (EIEC) NOT DETECTED NOT DETECTED Final   Cryptosporidium NOT DETECTED NOT DETECTED Final   Cyclospora cayetanensis NOT DETECTED NOT DETECTED Final   Entamoeba histolytica NOT DETECTED NOT DETECTED Final    Giardia lamblia NOT DETECTED NOT DETECTED Final   Adenovirus F40/41 NOT DETECTED NOT DETECTED Final   Astrovirus NOT DETECTED NOT DETECTED Final   Norovirus GI/GII NOT DETECTED NOT DETECTED Final   Rotavirus A NOT DETECTED NOT DETECTED Final   Sapovirus (I, II, IV, and V) NOT DETECTED NOT DETECTED Final    Comment: Performed at [facility], [address] [place], [place] [address]  C Difficile Quick Screen w PCR reflex     Status: Abnormal   Collection Time: 11/28/23 10:39 AM   Specimen: STOOL  Result Value  Ref Range Status   C Diff antigen POSITIVE (A) NEGATIVE Final   C Diff toxin POSITIVE (A) NEGATIVE Final   C Diff interpretation Toxin producing C. difficile detected.  Final    Comment: CRITICAL RESULT CALLED TO, READ BACK BY AND VERIFIED WITH: SULA GINS RN 1140 11/28/23 HNM Performed at [facility], [address] [place], [place] [address]   OVA + PARASITE EXAM     Status: None   Collection Time: 11/28/23 10:39 AM   Specimen: Stool  Result Value Ref Range Status   OVA + PARASITE EXAM Final report  Final    Comment: (NOTE) These results were obtained using wet preparation(s) and trichrome stained smear. This test does not include testing for Cryptosporidium parvum, Cyclospora, or Microsporidia. Performed At: [facility] [address] [place], [place] [id number] Cherilyn Maple DASEN MD [phone]    Source of Sample [facility]  Final    Comment: Performed at [facility], [address] [place], [place] [address]     Time coordinating discharge: 40 minutes  SIGNED:   Calvin KATHEE Robson, MD  [facility] 12/01/2023, 1:19 PM Pager   If 7PM-7AM, please contact night-coverage

## 2023-12-04 ENCOUNTER — Other Ambulatory Visit: Payer: Self-pay

## 2023-12-05 ENCOUNTER — Telehealth: Payer: Self-pay | Admitting: Neurosurgery

## 2023-12-05 NOTE — Telephone Encounter (Signed)
 Patient is calling to let our office know that she had her repeat imaging done through the ER after seeing Danielle on 11/27/2023 and would like to know what the next step is. Please advise.

## 2023-12-06 NOTE — Telephone Encounter (Signed)
 Left message to call back.

## 2023-12-06 NOTE — Telephone Encounter (Signed)
 Patient left a message with the answering service that she was returning a call to our office.

## 2023-12-07 NOTE — Telephone Encounter (Signed)
 Patient advised.

## 2023-12-09 ENCOUNTER — Other Ambulatory Visit: Payer: Self-pay

## 2023-12-11 ENCOUNTER — Ambulatory Visit: Admitting: Physician Assistant

## 2023-12-11 ENCOUNTER — Encounter: Payer: Self-pay | Admitting: Physician Assistant

## 2023-12-11 VITALS — BP 158/71 | HR 86 | Temp 97.9°F

## 2023-12-11 DIAGNOSIS — Z85828 Personal history of other malignant neoplasm of skin: Secondary | ICD-10-CM | POA: Diagnosis not present

## 2023-12-11 DIAGNOSIS — S0100XD Unspecified open wound of scalp, subsequent encounter: Secondary | ICD-10-CM

## 2023-12-11 NOTE — Progress Notes (Signed)
 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  Patient was considered for rotational flap, but recently developed traumatic subdural hematoma from fall and then was seen in the ED shortly thereafter for nonspecific chest pain.  Discussed Vashe gel on the wound twice daily and to hold off on surgery until she is more optimized medically.  She will need clearances from neurosurgery and cardiology.  Of note, shortly after seeing Dr. Montorfano 11/24/2023, she had another fall in the setting of weakness and was sent to the ED by her neurosurgery team.  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  She did see cardiology 12/06/2023.  At that time, she denied any recurrent chest pain.  Regardless, will likely require surgical clearance from Dr. Ammon.  Today, patient presents to clinic companied by husband and son at bedside.  They have been applying the Vashe gel, as directed.  However, they are concerned about the buildup they have developed over top of the wound.  They inquired about cleaning and ongoing dressing changes.  She is on vancomycin  for C. difficile for the next 4 weeks.  On exam, there does appear to be a moderate buildup of white deposits overlying the scalp wound, likely crystallized residue from the hypochlorous acid solution versus concentrated salts.  See picture.    The area softened considerably with application of saline soaked gauze x 5 minutes.  Will recommend transition to Xeroform for the next week and similar application of saline soaked gauze at time of dressing changes so that it can soften/remove the buildup between now and her next appointment and allow for better visualization of the wound.  She is nontender on exam and denies any pain or fever/chills.  She is feeling improved since her hyponatremia was  treated.  Plan is for her to return for scheduled appointment 12/22/2023 with Dr. Montorfano, as scheduled.

## 2023-12-15 ENCOUNTER — Ambulatory Visit: Admitting: Student

## 2023-12-15 VITALS — BP 122/66 | HR 81

## 2023-12-15 DIAGNOSIS — S0100XD Unspecified open wound of scalp, subsequent encounter: Secondary | ICD-10-CM | POA: Diagnosis not present

## 2023-12-15 DIAGNOSIS — Z85828 Personal history of other malignant neoplasm of skin: Secondary | ICD-10-CM

## 2023-12-15 NOTE — Progress Notes (Signed)
 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  Patient was last seen in the clinic on 12/11/2023.  At this visit, patient and her husband reported that they have been applying Vashe gel as directed to the scalp wound, however they were concerned about the buildup that had to lift over the top of the wound.  On exam, there appeared to be a moderate amount of buildup of white deposits overlying the scalp.  The area softened considerably with application of saline soaked gauze for 5 minutes.  It was recommended that patient transition to Xeroform for the next week and similar application of saline soaked gauze at the time of dressing changes so that it can soften/remove the buildup between now and the next appointment.  Today, patient presents with her family at bedside.  Side.  She reports she is overall been doing well.  She reports that her husband has been doing her dressing changes.  She does not report any infectious symptoms today.  She does state that she is still taking vancomycin  for C. difficile and will be taking the vancomycin  for another few weeks.  On exam, patient is sitting upright in no acute distress.  There does appear to be some granulation tissue noted to the wound to the scalp.  There is still some exposed bone noted within the wound.  The skin surrounding the wound appears to be mildly irritated, but does not appear to be cellulitic.  There does appear to be does appear to be exudate/residual gel in the patient's hair.   Dr. Montorfano had the opportunity to examine the patient today.  Recommended that patient clean the wound with Vashe and apply surgical lube over the wound followed by Vashe gauze then dry gauze 1-2 times daily 2 times daily.  Recommended that patient clean her hair/surrounding skin with gentle baby shampoo.  Patient and her family expressed  understanding.  Recommended that patient follow back up with Dr. Montorfano in about 4 weeks after C. difficile has complete completely resolved and to further discuss possible rotational flap procedure.  Instructed the patient to call in the meantime if she has any questions or concerns about anything.  Pictures were obtained of the patient and placed in the chart with the patient's or guardian's permission.

## 2023-12-19 NOTE — Progress Notes (Signed)
 CM received a call that [facility] does not [place]. CM verified address and phone number with patient via phone. CM reached out to [facility]. She states that they service [place]. They plan to see the patient tomorrow. Cm notified patient via phone call.

## 2023-12-20 ENCOUNTER — Ambulatory Visit: Admitting: Student

## 2023-12-20 VITALS — BP 155/73 | HR 82 | Ht 61.0 in | Wt 128.0 lb

## 2023-12-20 DIAGNOSIS — S0100XD Unspecified open wound of scalp, subsequent encounter: Secondary | ICD-10-CM

## 2023-12-20 DIAGNOSIS — Z09 Encounter for follow-up examination after completed treatment for conditions other than malignant neoplasm: Secondary | ICD-10-CM

## 2023-12-20 NOTE — Progress Notes (Signed)
 Referring Provider Auston Reyes BIRCH, MD [address] [facility] [place],  [place] [address]   CC:  Chief Complaint  Patient presents with   Follow-up      Sheryl Kennedy is an 78 y.o. female.  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  Patient was last seen in the clinic on 12/15/2023.  At this visit, patient was doing well.  On exam, there did appear to be some granulation tissue noted to the wound to the scalp.  There was still some exposed bone within the wound.  The skin surrounding the wound appeared to be mildly irritated, but did not appear to be cellulitic.  It was recommended that patient clean the wound with Vashe and apply surgical lube over the wound followed by Vashe gauze then dry gauze 1-2 times daily.  It was also recommended she clean her hair and/surrounding skin with gentle baby shampoo.  Today, patient reports with family at bedside.  She reports she is overall doing well.  She states that she had some drainage on her gauze and was a little bit worried about it.  She states that the drainage was yellowish/greenish.  She denies any fevers or chills.  She reports that she has cleaned her skin/hair twice since she was last seen in the clinic.  She reports she has been doing the twice a day surgical lube as prescribed.  Review of Systems General: Denies any fevers or chills.  Physical Exam    12/20/2023   12:56 PM 12/15/2023    3:31 PM 12/11/2023   12:41 PM  Vitals with BMI  Height 5' 1    Weight 128 lbs    BMI 24.2    Systolic 155 122 841  Diastolic 73 66 71  Pulse 82 81 86    General:  No acute distress,  Alert and oriented, Non-Toxic, Normal speech and affect On exam, patient sitting upright in no acute distress.  Upon removal of the dressings, there was a  little bit of yellow serous drainage on the gauze.  Wound appears to be clean with some healthy appearing granulation tissue within the wound along with some exposed bone.  There is no crepitus noted on exam.  No cellulitic changes to the skin surrounding the wound.  Exudate/residual gel in her hair/skin does appear to be improved from the previous exam.  There is no active drainage on exam.  There does not appear to be any sign of infection on exam.  Assessment/Plan  Follow-up exam   Discussed with the patient that there does not appear to be any sign of infection on exam.  Discussed with her that a little bit of yellowish drainage is okay on her gauze.  I discussed with her that bright green drainage would be concerning and I do want her to let me know if she experiences that.  Patient expressed understanding.  Discussed with the patient to continue to clean the wound with Vashe and apply surgical lube twice daily and cover with gauze as prescribed before.  She expressed understanding.  Discussed with the patient to continue to monitor the area and if she has any questions or concerns about anything to give us  a call.  She expressed understanding.  Patient to follow-up at her next scheduled appointment  Pictures were obtained of the patient and placed in the chart with the patient's or guardian's permission.   [name] 12/20/2023, 1:18 PM

## 2023-12-22 ENCOUNTER — Ambulatory Visit

## 2024-01-09 ENCOUNTER — Encounter (INDEPENDENT_AMBULATORY_CARE_PROVIDER_SITE_OTHER): Payer: Self-pay | Admitting: Nurse Practitioner

## 2024-01-09 ENCOUNTER — Ambulatory Visit (INDEPENDENT_AMBULATORY_CARE_PROVIDER_SITE_OTHER): Admitting: Nurse Practitioner

## 2024-01-09 VITALS — BP 145/84 | HR 80 | Resp 17 | Ht 60.0 in | Wt 132.0 lb

## 2024-01-09 DIAGNOSIS — I83893 Varicose veins of bilateral lower extremities with other complications: Secondary | ICD-10-CM

## 2024-01-09 DIAGNOSIS — I10 Essential (primary) hypertension: Secondary | ICD-10-CM | POA: Diagnosis not present

## 2024-01-12 ENCOUNTER — Ambulatory Visit: Admitting: Student

## 2024-01-12 ENCOUNTER — Encounter: Payer: Self-pay | Admitting: Student

## 2024-01-12 VITALS — BP 167/76 | HR 76 | Ht 60.0 in | Wt 132.0 lb

## 2024-01-12 DIAGNOSIS — S0100XD Unspecified open wound of scalp, subsequent encounter: Secondary | ICD-10-CM | POA: Diagnosis not present

## 2024-01-12 DIAGNOSIS — Z09 Encounter for follow-up examination after completed treatment for conditions other than malignant neoplasm: Secondary | ICD-10-CM

## 2024-01-12 DIAGNOSIS — Z85828 Personal history of other malignant neoplasm of skin: Secondary | ICD-10-CM

## 2024-01-12 DIAGNOSIS — Z9889 Other specified postprocedural states: Secondary | ICD-10-CM

## 2024-01-12 NOTE — Progress Notes (Signed)
"  ° °  Referring Provider Auston Reyes BIRCH, MD [address] [facility] [place],  [place] [address]   CC:  Chief Complaint  Patient presents with   Follow-up      Sheryl Kennedy is an 79 y.o. female.  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   Patient was last seen in the clinic on 12/20/2023.  At this visit, patient was overall doing well.  On exam, wound appeared to be clean with some healthy appearing granulation tissue within the wound along with exposed bone.  Exudate/residual gel in the hair appeared to be improved from the prior exam.  There is not any sign of infection on exam.  Recommended that patient continue to clean the wound with Vashe and apply surgical lube twice a day and cover with gauze as prescribed before.  Today, patient presents with her family at bedside.  They report that they have been doing dressing changes and they feel that has been going well.  Patient denies any fevers or chills.  Patient does report that she has had some leg swelling that has been worked up by vascular surgery.  She states that she has an ultrasound of her leg next week.  Patient also states that she is still on antibiotics for her C. difficile.  She states that she is possibly going to get an implant procedure for her C. difficile.  Review of Systems General: Denies any fevers or chills.  Physical Exam    01/12/2024    1:55 PM 01/09/2024   11:31 AM 12/20/2023   12:56 PM  Vitals with BMI  Height 5' 0 5' 0 5' 1  Weight 132 lbs 132 lbs 128 lbs  BMI 25.78 25.78 24.2  Systolic 167 145 844  Diastolic 76 84 73  Pulse 76 80 82    General:  No acute distress,  Alert and oriented, Non-Toxic, Normal speech and affect On exam, patient sitting upright in no acute distress.  Wound to the scalp does appear to be improving.  There appears to be improved  epithelialization.  There still is some exposed bone.  There is no surrounding erythema or drainage.  No signs of infection.  Assessment/Plan  S/P Mohs surgery for basal cell carcinoma  Follow-up exam   Reviewed today's appointment and photos with Dr. Montorfano.  Given other medical issues that patient has going on and the improvement in the wound, recommended that patient continue with wound care as prescribed at this time.  I conveyed this to the patient and she expressed understanding.  Patient's blood pressure is elevated in clinic today.  She denies any new headaches or changes in her vision.  She states that she has been taking her blood pressure medications.  She reports that she has a blood pressure cuff at home.  I recommended that she retake her blood pressure at home.  Discussed with her that if it remains elevated, she needs to reach out to her PCP today.  She expressed understanding.  Patient to follow back up in 1 month with Dr. Montorfano.  Instructed her to call if she has any questions or concerns about anything.  Pictures were obtained of the patient and placed in the chart with the patient's or guardian's permission.   Sheryl Kennedy 01/12/2024, 3:03 PM         "

## 2024-01-14 ENCOUNTER — Encounter (INDEPENDENT_AMBULATORY_CARE_PROVIDER_SITE_OTHER): Payer: Self-pay | Admitting: Nurse Practitioner

## 2024-01-14 NOTE — Progress Notes (Signed)
 "  Subjective:    Patient ID: Sheryl Kennedy, female    DOB: [birth year], 79 y.o.   MRN: [id number] Chief Complaint  Patient presents with   New Patient (Initial Visit)    np. consult. LE edema. Venous Stasis.  SPARKS, JEFFREY    HPI  Discussed the use of AI scribe software for clinical note transcription with the patient, who gave verbal consent to proceed.  History of Present Illness Sheryl Kennedy is a 79 year old female with chronic venous insufficiency, prior endovenous ablation, and lymphedema who presents with worsening lower extremity swelling and varicose veins.  She reports longstanding bilateral lower extremity edema, with one leg consistently more enlarged than the other. The swelling has worsened and is now present daily, particularly since discontinuation of diuretics and antihypertensive medications following a hospitalization for hyponatremia in late November. She notes her blood pressure is now elevated compared to baseline, with a recent measurement of 145/84 versus her usual 120s/70s.  She has undergone endovenous laser ablation and sclerotherapy on both legs, with one leg requiring repeat intervention due to symptom recurrence, though she is uncertain of the laterality. She has a remote history of lower extremity deep vein thrombosis, previously managed with aspirin , which has been discontinued. She is not currently on anticoagulation.  She wears compression stockings regularly, which provide partial but incomplete relief of edema. She elevates her legs and remains active as tolerated. She notes visible varicosities and associated skin changes, including blisters and excoriations, for which she applies a compounded cholesterol-based topical cream prescribed by dermatology.  Her surgical history includes bilateral knee arthroplasty and prior foot/ankle surgery for ligament and tendon repair, which may contribute to her chronic lower extremity edema and  lymphedema.    Results     Review of Systems  Cardiovascular:  Positive for leg swelling.  Skin:  Positive for wound.  All other systems reviewed and are negative.      Objective:   Physical Exam Vitals reviewed.  HENT:     Head: Normocephalic.  Cardiovascular:     Rate and Rhythm: Normal rate.  Pulmonary:     Effort: Pulmonary effort is normal.  Skin:    General: Skin is warm and dry.  Neurological:     Mental Status: She is alert and oriented to person, place, and time.     Physical Exam EXTREMITIES: Right leg swollen. SKIN: Blisters and scratches on skin. Prominent veins visible.  BP (!) 145/84   Pulse 80   Resp 17   Ht 5' (1.524 m)   Wt 132 lb (59.9 kg)   BMI 25.78 kg/m   Past Medical History:  Diagnosis Date   Arthritis    Cancer (HCC) 08/2012   skin right anterior lower leg, squamous cell   DSAP (disseminated superficial actinic porokeratosis)    Gout    Hx of seasonal allergies    Hyperlipidemia    Hypertension    Renal disorder    CKD stage 3   SCC (squamous cell carcinoma) 07/31/2023   superfical infiltration, R crown scalp, referral to Dr. 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 Left forearm. Excision 02/01/2023   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 complications     Social History   Socioeconomic History   Marital status:  Married    Spouse name: Not on file   Number of children: Not on file   Years of education: Not on file   Highest education level: Not on file  Occupational History   Not on file  Tobacco Use   Smoking status: Never   Smokeless tobacco: Never  Vaping Use   Vaping status: Never Used  Substance and Sexual Activity   Alcohol use: Yes    Comment: occassional   Drug use: No   Sexual activity: Not Currently  Other Topics Concern    Not on file  Social History Narrative   Not on file   Social Drivers of Health   Tobacco Use: Low Risk (01/14/2024)   Patient History    Smoking Tobacco Use: Never    Smokeless Tobacco Use: Never    Passive Exposure: Not on file  Financial Resource Strain: Medium Risk (07/25/2023)   Received from [facility]   Overall Financial Resource Strain (CARDIA)    Difficulty of Paying Living Expenses: Somewhat hard  Food Insecurity: No Food Insecurity (11/28/2023)   Epic    Worried About Running Out of Food in the Last Year: Never true    Ran Out of Food in the Last Year: Never true  Transportation Needs: No Transportation Needs (11/28/2023)   Epic    Lack of Transportation (Medical): No    Lack of Transportation (Non-Medical): No  Physical Activity: Not on file  Stress: Not on file  Social Connections: Socially Integrated (11/28/2023)   Social Connection and Isolation Panel    Frequency of Communication with Friends and Family: More than three times a week    Frequency of Social Gatherings with Friends and Family: Three times a week    Attends Religious Services: More than 4 times per year    Active Member of Clubs or Organizations: Yes    Attends Banker Meetings: More than 4 times per year    Marital Status: Married  Catering Manager Violence: Not At Risk (11/28/2023)   Epic    Fear of Current or Ex-Partner: No    Emotionally Abused: No    Physically Abused: No    Sexually Abused: No  Depression (PHQ2-9): Not on file  Alcohol Screen: Not on file  Housing: Low Risk  (12/14/2023)   Received from [facility]   Epic    In the last 12 months, was there a time when you were not able to pay the mortgage or rent on time?: No    In the past 12 months, how many times have you moved where you were living?: 0    At any time in the past 12 months, were you homeless or living in a shelter (including now)?: No  Utilities: Not At Risk  (11/28/2023)   Epic    Threatened with loss of utilities: No  Health Literacy: Not on file    Past Surgical History:  Procedure Laterality Date   BACK SURGERY  2008   no metal in back   BREAST BIOPSY Right 09/13/2010   core/neg   BREAST BIOPSY Right 12/09/2015   us  biopsy/neg   BREAST SURGERY Right 2012   CATARACT EXTRACTION W/PHACO Left 04/25/2022   Procedure: CATARACT EXTRACTION PHACO AND INTRAOCULAR LENS PLACEMENT (IOC) LEFT 3.42 00:25.2;  Surgeon: Myrna Adine Anes, MD;  Location: [facility];  Service: Ophthalmology;  Laterality: Left;   CATARACT EXTRACTION W/PHACO Right 05/23/2022   Procedure: CATARACT EXTRACTION PHACO AND INTRAOCULAR LENS  PLACEMENT (IOC) RIGHT 3.84 00:28.1;  Surgeon: Myrna Adine Anes, MD;  Location: [facility];  Service: Ophthalmology;  Laterality: Right;   CHONDROPLASTY Left 01/22/2016   Procedure: arthroscopic medial AND LATERAL CHONDROPLASTY;  Surgeon: Lynwood SHAUNNA Hue, MD;  Location: [facility] OR[facility];  Service: Orthopedics;  Laterality: Left;   COLONOSCOPY  2011   COLONOSCOPY WITH PROPOFOL  N/A 04/14/2015   Procedure: COLONOSCOPY WITH PROPOFOL ;  Surgeon: Gladis RAYMOND Mariner, MD;  Location: [facility];  Service: Endoscopy;  Laterality: N/A;   CYST REMOVAL HAND Left 2012   JOINT REPLACEMENT Right 09/30/2013   TOTAL KNEE REPLACEMENT, DR. HUE, [facility]   KNEE ARTHROSCOPY WITH MEDIAL MENISECTOMY Left 01/22/2016   Procedure: KNEE ARTHROSCOPY WITH MEDIAL AND LATERAL MENISECTOMY;  Surgeon: Lynwood SHAUNNA Hue, MD;  Location: [facility];  Service: Orthopedics;  Laterality: Left;   KNEE SURGERY  1997   LEFT HEART CATH AND CORONARY ANGIOGRAPHY N/A 10/25/2021   Procedure: LEFT HEART CATH AND CORONARY ANGIOGRAPHY;  Surgeon: Mady Bruckner, MD;  Location: [facility] INVASIVE CV [facility];  Service: Cardiovascular;  Laterality: N/A;   Stab pheblectomy  Left 2013   TONSILLECTOMY     VEIN CLOSURE Bilateral 2012    Family History  Problem Relation Age of Onset   Heart  failure Mother    Heart disease Mother    Arthritis Mother    Colon cancer Paternal Grandmother    Breast cancer Neg Hx     Allergies[1]     Latest Ref Rng & Units 12/01/2023    5:48 AM 11/30/2023    3:00 AM 11/29/2023    3:39 AM  CBC  WBC 4.0 - 10.5 K/uL 7.5  8.7  11.3   Hemoglobin 12.0 - 15.0 g/dL 89.2  89.3  88.5   Hematocrit 36.0 - 46.0 % 31.3  31.1  33.5   Platelets 150 - 400 K/uL 324  315  315       CMP     Component Value Date/Time   NA 138 12/01/2023 0548   NA 134 (L) 10/03/2013 0521   K 3.4 (L) 12/01/2023 0548   K 3.7 10/03/2013 0521   CL 104 12/01/2023 0548   CL 95 (L) 10/03/2013 0521   CO2 25 12/01/2023 0548   CO2 34 (H) 10/03/2013 0521   GLUCOSE 90 12/01/2023 0548   GLUCOSE 119 (H) 10/03/2013 0521   BUN 8 12/01/2023 0548   BUN 12 10/03/2013 0521   CREATININE 0.72 12/01/2023 0548   CREATININE 1.03 10/03/2013 0521   CALCIUM 9.2 12/01/2023 0548   CALCIUM 9.2 10/03/2013 0521   PROT 5.9 (L) 11/27/2023 1716   ALBUMIN 3.7 11/27/2023 1716   AST 47 (H) 11/27/2023 1716   ALT 24 11/27/2023 1716   ALKPHOS 106 11/27/2023 1716   BILITOT 0.4 11/27/2023 1716   GFRNONAA >60 12/01/2023 0548   GFRNONAA 57 (L) 10/03/2013 0521   GFRNONAA 36 (L) 09/18/2013 0919     No results found.     Assessment & Plan:   1. 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 Differential includes venous insufficiency and lymphedema. Compression stockings provide partial benefit. Further evaluation needed to assess venous insufficiency. - Ordered venous duplex ultrasound to evaluate venous insufficiency. - Advised continued use of compression stockings, leg elevation, and activity as tolerated. - Scheduled follow-up to review ultrasound results and discuss management options. - Discussed potential for repeat ablation  or  sclerotherapy depending on ultrasound findings. - VAS US  LOWER EXTREMITY VENOUS REFLUX; Future  2. Essential hypertension Continue antihypertensive medications as already ordered, these medications have been reviewed and there are no changes at this time.   Assessment and Plan Assessment & Plan      Medications Ordered Prior to Encounter[2]  There are no Patient Instructions on file for this visit. Return for Pt Conv Bil Venous Reflux Jd/APP.   Orvin FORBES Daring, NP      [1]  Allergies Allergen Reactions   Ery-Tab [Erythromycin] Other (See Comments)    Unknown reaction   Fosamax [Alendronate] Other (See Comments)    Possible cause of hypercalcemia   Levaquin [Levofloxacin] Other (See Comments)     Dizziness    Aleve [Naproxen] Swelling and Other (See Comments)    Arthralgias  Swelling in hands/joints, also   Shellfish Allergy Swelling, Dermatitis and Other (See Comments)    Arthralgias Joint swelling, also Gout flares   Sudafed [Pseudoephedrine] Rash and Other (See Comments)    Rhinitis Sneezing   Sulfa Antibiotics Rash  [2]  Current Outpatient Medications on File Prior to Visit  Medication Sig Dispense Refill   acetaminophen  (TYLENOL ) 500 MG tablet Take 1,000 mg by mouth 2 (two) times daily as needed for fever or headache (pain).     allopurinol  (ZYLOPRIM ) 300 MG tablet Take 300 mg by mouth daily.     feeding supplement (ENSURE PLUS HIGH PROTEIN) LIQD Take 237 mLs by mouth 2 (two) times daily between meals. 14220 mL 0   fexofenadine (ALLEGRA) 180 MG tablet Take 80 mg by mouth 3 times/day as needed-between meals & bedtime for allergies.     fiber (NUTRISOURCE FIBER) PACK packet Take 1 packet by mouth 2 (two) times daily.     levothyroxine  (SYNTHROID ) 50 MCG tablet Take 50 mcg by mouth daily before breakfast.     magnesium  oxide (MAG-OX) 400 MG tablet Take 1 tablet by mouth 2 (two) times daily.     metoprolol  succinate (TOPROL -XL) 25 MG 24 hr tablet Take 25 mg by  mouth daily.      Multiple Vitamins-Minerals (CENTRUM SILVER WOMEN 50+) TABS Take 1 tablet by mouth daily.     saccharomyces boulardii (FLORASTOR) 250 MG capsule Take 1 capsule (250 mg total) by mouth 2 (two) times daily. 60 capsule 0   simvastatin  (ZOCOR ) 20 MG tablet Take 20 mg by mouth at bedtime.     traMADol  (ULTRAM ) 50 MG tablet Take 50 mg by mouth every 12 (twelve) hours as needed.     traZODone  (DESYREL ) 50 MG tablet Take 25 mg by mouth at bedtime as needed for sleep.     ketoconazole  (NIZORAL ) 2 % shampoo WASH SCALP 2-3 TIMES WEEKLY, LET SIT 5 MINUTES AND RINSE 120 mL 3   [Paused] potassium chloride  SA (K-DUR,KLOR-CON ) 20 MEQ tablet Take 20 mEq by mouth 3 (three) times daily.      [Paused] spironolactone-hydrochlorothiazide (ALDACTAZIDE) 25-25 MG tablet Take 1 tablet by mouth daily.     No current facility-administered medications on file prior to visit.   "

## 2024-01-18 ENCOUNTER — Other Ambulatory Visit (HOSPITAL_COMMUNITY): Payer: Self-pay

## 2024-01-19 ENCOUNTER — Encounter (INDEPENDENT_AMBULATORY_CARE_PROVIDER_SITE_OTHER): Payer: Self-pay | Admitting: Nurse Practitioner

## 2024-01-19 ENCOUNTER — Ambulatory Visit (INDEPENDENT_AMBULATORY_CARE_PROVIDER_SITE_OTHER): Admitting: Nurse Practitioner

## 2024-01-19 ENCOUNTER — Ambulatory Visit (INDEPENDENT_AMBULATORY_CARE_PROVIDER_SITE_OTHER)

## 2024-01-19 VITALS — BP 133/67 | HR 87 | Resp 18

## 2024-01-19 DIAGNOSIS — I83893 Varicose veins of bilateral lower extremities with other complications: Secondary | ICD-10-CM

## 2024-01-19 DIAGNOSIS — I10 Essential (primary) hypertension: Secondary | ICD-10-CM

## 2024-01-27 ENCOUNTER — Encounter (INDEPENDENT_AMBULATORY_CARE_PROVIDER_SITE_OTHER): Payer: Self-pay | Admitting: Nurse Practitioner

## 2024-01-27 NOTE — Progress Notes (Signed)
 "  Subjective:    Patient ID: Sheryl Kennedy, female    DOB: 12-09-1945, 79 y.o.   MRN: [id number] Chief Complaint  Patient presents with   Follow-up    Pt conv bil    HPI  Discussed the use of AI scribe software for clinical note transcription with the patient, who gave verbal consent to proceed.  History of Present Illness Sheryl Kennedy is a 79 year old female with chronic venous insufficiency and prior vein ablation who presents for evaluation of worsening lower extremity swelling.  She reports progressive swelling of the lower extremity, most pronounced from the groin to the knee, with associated tightness and discomfort, particularly around the ankle and lower leg. The swelling is visibly noticeable and has impacted her daily activities, as others have remarked on the size difference between her legs.  Compression stockings provide partial relief, especially at the ankle, but are less effective proximally. She is not wearing her tightest hose today due to difficulty with application and removal.  She has a history of vein ablation and sclerotherapy several years ago, possibly on both legs, but is uncertain of the details. She recalls that the initial procedure required repeat intervention due to incomplete success, and is unsure if it was ever fully effective. These procedures were performed at a different facility.  She denies fevers, chills, sweats, chest pain, shortness of breath, or neurological deficits.  She is currently undergoing treatment for Clostridioides difficile infection and expresses distress over its prolonged course. She is also recovering from Mohs surgery and wishes to allow time for healing before considering further intervention for her leg swelling.    Results Diagnostic Lower extremity venous duplex ultrasound: No deep vein thrombosis. Deep venous system demonstrates venous insufficiency from femoral vein to popliteal vein. Superficial venous system  demonstrates extensive venous reflux from saphenofemoral junction to calf.   Review of Systems  Cardiovascular:  Positive for leg swelling.  Gastrointestinal:  Positive for diarrhea.  All other systems reviewed and are negative.      Objective:   Physical Exam Vitals reviewed.  HENT:     Head: Normocephalic.  Cardiovascular:     Rate and Rhythm: Normal rate.     Pulses: Normal pulses.  Pulmonary:     Effort: Pulmonary effort is normal.  Skin:    General: Skin is warm and dry.  Neurological:     Mental Status: She is alert and oriented to person, place, and time.  Psychiatric:        Mood and Affect: Mood normal.        Behavior: Behavior normal.        Thought Content: Thought content normal.        Judgment: Judgment normal.     Physical Exam EXTREMITIES: Right leg swollen, tighter distally.  BP 133/67 (BP Location: Right Arm)   Pulse 87   Resp 18   Past Medical History:  Diagnosis Date   Arthritis    Cancer (HCC) 08/2012   skin right anterior lower leg, squamous cell   DSAP (disseminated superficial actinic porokeratosis)    Gout    Hx of seasonal allergies    Hyperlipidemia    Hypertension    Renal disorder    CKD stage 3   SCC (squamous cell carcinoma) 07/31/2023   superfical infiltration, R crown scalp, referral to Dr. 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 Left forearm. Excision 02/01/2023   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 complications     Social History   Socioeconomic History   Marital status: Married    Spouse name: Not on file   Number of children: Not on file   Years of education: Not on file   Highest education level: Not on file  Occupational History   Not on file  Tobacco Use   Smoking status: Never   Smokeless tobacco: Never  Vaping Use   Vaping  status: Never Used  Substance and Sexual Activity   Alcohol use: Yes    Comment: occassional   Drug use: No   Sexual activity: Not Currently  Other Topics Concern   Not on file  Social History Narrative   Not on file   Social Drivers of Health   Tobacco Use: Low Risk (01/27/2024)   Patient History    Smoking Tobacco Use: Never    Smokeless Tobacco Use: Never    Passive Exposure: Not on file  Financial Resource Strain: Medium Risk (07/25/2023)   Received from [facility]   Overall Financial Resource Strain (CARDIA)    Difficulty of Paying Living Expenses: Somewhat hard  Food Insecurity: No Food Insecurity (11/28/2023)   Epic    Worried About Running Out of Food in the Last Year: Never true    Ran Out of Food in the Last Year: Never true  Transportation Needs: No Transportation Needs (11/28/2023)   Epic    Lack of Transportation (Medical): No    Lack of Transportation (Non-Medical): No  Physical Activity: Not on file  Stress: Not on file  Social Connections: Socially Integrated (11/28/2023)   Social Connection and Isolation Panel    Frequency of Communication with Friends and Family: More than three times a week    Frequency of Social Gatherings with Friends and Family: Three times a week    Attends Religious Services: More than 4 times per year    Active Member of Clubs or Organizations: Yes    Attends Banker Meetings: More than 4 times per year    Marital Status: Married  Catering Manager Violence: Not At Risk (11/28/2023)   Epic    Fear of Current or Ex-Partner: No    Emotionally Abused: No    Physically Abused: No    Sexually Abused: No  Depression (PHQ2-9): Not on file  Alcohol Screen: Not on file  Housing: Low Risk  (12/14/2023)   Received from [facility]   Epic    In the last 12 months, was there a time when you were not able to pay the mortgage or rent on time?: No    In the past 12 months, how many times have  you moved where you were living?: 0    At any time in the past 12 months, were you homeless or living in a shelter (including now)?: No  Utilities: Not At Risk (11/28/2023)   Epic    Threatened with loss of utilities: No  Health Literacy: Not on file    Past Surgical History:  Procedure Laterality Date   BACK SURGERY  2008   no metal in back   BREAST BIOPSY Right 09/13/2010   core/neg   BREAST BIOPSY Right 12/09/2015   us  biopsy/neg   BREAST SURGERY Right 2012   CATARACT EXTRACTION W/PHACO  Left 04/25/2022   Procedure: CATARACT EXTRACTION PHACO AND INTRAOCULAR LENS PLACEMENT (IOC) LEFT 3.42 00:25.2;  Surgeon: Myrna Adine Anes, MD;  Location: [facility];  Service: Ophthalmology;  Laterality: Left;   CATARACT EXTRACTION W/PHACO Right 05/23/2022   Procedure: CATARACT EXTRACTION PHACO AND INTRAOCULAR LENS PLACEMENT (IOC) RIGHT 3.84 00:28.1;  Surgeon: Myrna Adine Anes, MD;  Location: [facility];  Service: Ophthalmology;  Laterality: Right;   CHONDROPLASTY Left 01/22/2016   Procedure: arthroscopic medial AND LATERAL CHONDROPLASTY;  Surgeon: Lynwood SHAUNNA Hue, MD;  Location: [facility];  Service: Orthopedics;  Laterality: Left;   COLONOSCOPY  2011   COLONOSCOPY WITH PROPOFOL  N/A 04/14/2015   Procedure: COLONOSCOPY WITH PROPOFOL ;  Surgeon: Gladis RAYMOND Mariner, MD;  Location: [facility];  Service: Endoscopy;  Laterality: N/A;   CYST REMOVAL HAND Left 2012   JOINT REPLACEMENT Right 09/30/2013   TOTAL KNEE REPLACEMENT, DR. HUE, [facility]   KNEE ARTHROSCOPY WITH MEDIAL MENISECTOMY Left 01/22/2016   Procedure: KNEE ARTHROSCOPY WITH MEDIAL AND LATERAL MENISECTOMY;  Surgeon: Lynwood SHAUNNA Hue, MD;  Location: [facility];  Service: Orthopedics;  Laterality: Left;   KNEE SURGERY  1997   LEFT HEART CATH AND CORONARY ANGIOGRAPHY N/A 10/25/2021   Procedure: LEFT HEART CATH AND CORONARY ANGIOGRAPHY;  Surgeon: Mady Bruckner, MD;  Location: [facility];  Service: Cardiovascular;   Laterality: N/A;   Stab pheblectomy  Left 2013   TONSILLECTOMY     VEIN CLOSURE Bilateral 2012    Family History  Problem Relation Age of Onset   Heart failure Mother    Heart disease Mother    Arthritis Mother    Colon cancer Paternal Grandmother    Breast cancer Neg Hx     Allergies[1]     Latest Ref Rng & Units 12/01/2023    5:48 AM 11/30/2023    3:00 AM 11/29/2023    3:39 AM  CBC  WBC 4.0 - 10.5 K/uL 7.5  8.7  11.3   Hemoglobin 12.0 - 15.0 g/dL 89.2  89.3  88.5   Hematocrit 36.0 - 46.0 % 31.3  31.1  33.5   Platelets 150 - 400 K/uL 324  315  315       CMP     Component Value Date/Time   NA 138 12/01/2023 0548   NA 134 (L) 10/03/2013 0521   K 3.4 (L) 12/01/2023 0548   K 3.7 10/03/2013 0521   CL 104 12/01/2023 0548   CL 95 (L) 10/03/2013 0521   CO2 25 12/01/2023 0548   CO2 34 (H) 10/03/2013 0521   GLUCOSE 90 12/01/2023 0548   GLUCOSE 119 (H) 10/03/2013 0521   BUN 8 12/01/2023 0548   BUN 12 10/03/2013 0521   CREATININE 0.72 12/01/2023 0548   CREATININE 1.03 10/03/2013 0521   CALCIUM 9.2 12/01/2023 0548   CALCIUM 9.2 10/03/2013 0521   PROT 5.9 (L) 11/27/2023 1716   ALBUMIN 3.7 11/27/2023 1716   AST 47 (H) 11/27/2023 1716   ALT 24 11/27/2023 1716   ALKPHOS 106 11/27/2023 1716   BILITOT 0.4 11/27/2023 1716   GFRNONAA >60 12/01/2023 0548   GFRNONAA 57 (L) 10/03/2013 0521   GFRNONAA 36 (L) 09/18/2013 0919     No results found.     Assessment & Plan:   1. Varicose veins of bilateral lower extremities with other complications (Primary) Chronic venous insufficiency with symptomatic varicose veins of bilateral lower extremities Chronic venous insufficiency with significant edema and symptomatic varicose veins. Duplex ultrasound showed deep venous insufficiency  and extensive superficial venous reflux. Prior treatments were ineffective. Symptoms progressed with increased edema and discomfort. - Continue compression stockings, leg elevation, and activity. -  Discussed endovenous ablation of the superficial saphenous vein as an office-based intervention with local anesthesia and mild sedation. Explained potential improvement in edema, pain, heaviness, and wound healing. Risks include procedural discomfort, transient tenderness, superficial thrombosis, and rare deep vein thrombosis. Recovery involves transient discomfort and tightness. - Discussed sclerotherapy as an alternative with reduced efficacy for the saphenous vein due to high flow and risk of sclerosant migration. - Discussed adjunctive lymph pump use if edema persists post-ablation. - She deferred intervention until resolution of C. difficile infection and healing of Mohs surgery site. - Scheduled follow-up in three months to reassess venous symptoms and readiness for intervention. - Reassured her that conservative management is appropriate and intervention is elective.  2. Essential hypertension Continue antihypertensive medications as already ordered, these medications have been reviewed and there are no changes at this time.Continue antihypertensive medications as already ordered, these medications have been reviewed and there are no changes at this time.   Assessment and Plan Assessment & Plan      Medications Ordered Prior to Encounter[2]  There are no Patient Instructions on file for this visit. Return in about 3 months (around 04/18/2024) for No studies with JD/FB.   Orvin FORBES Daring, NP      [1]  Allergies Allergen Reactions   Ery-Tab [Erythromycin] Other (See Comments)    Unknown reaction   Fosamax [Alendronate] Other (See Comments)    Possible cause of hypercalcemia   Levaquin [Levofloxacin] Other (See Comments)     Dizziness    Aleve [Naproxen] Swelling and Other (See Comments)    Arthralgias  Swelling in hands/joints, also   Shellfish Allergy Swelling, Dermatitis and Other (See Comments)    Arthralgias Joint swelling, also Gout flares   Sudafed  [Pseudoephedrine] Rash and Other (See Comments)    Rhinitis Sneezing   Sulfa Antibiotics Rash  [2]  Current Outpatient Medications on File Prior to Visit  Medication Sig Dispense Refill   acetaminophen  (TYLENOL ) 500 MG tablet Take 1,000 mg by mouth 2 (two) times daily as needed for fever or headache (pain).     allopurinol  (ZYLOPRIM ) 300 MG tablet Take 300 mg by mouth daily.     feeding supplement (ENSURE PLUS HIGH PROTEIN) LIQD Take 237 mLs by mouth 2 (two) times daily between meals. 14220 mL 0   fexofenadine (ALLEGRA) 180 MG tablet Take 80 mg by mouth 3 times/day as needed-between meals & bedtime for allergies.     fiber (NUTRISOURCE FIBER) PACK packet Take 1 packet by mouth 2 (two) times daily.     ketoconazole  (NIZORAL ) 2 % shampoo WASH SCALP 2-3 TIMES WEEKLY, LET SIT 5 MINUTES AND RINSE 120 mL 3   levothyroxine  (SYNTHROID ) 50 MCG tablet Take 50 mcg by mouth daily before breakfast.     magnesium  oxide (MAG-OX) 400 MG tablet Take 1 tablet by mouth 2 (two) times daily.     metoprolol  succinate (TOPROL -XL) 25 MG 24 hr tablet Take 25 mg by mouth daily.      Multiple Vitamins-Minerals (CENTRUM SILVER WOMEN 50+) TABS Take 1 tablet by mouth daily.     [Paused] potassium chloride  SA (K-DUR,KLOR-CON ) 20 MEQ tablet Take 20 mEq by mouth 3 (three) times daily.      predniSONE (DELTASONE) 10 MG tablet Taper 6-5-4-3-2-1-off     saccharomyces boulardii (FLORASTOR) 250 MG capsule Take 1 capsule (250 mg  total) by mouth 2 (two) times daily. 60 capsule 0   simvastatin  (ZOCOR ) 20 MG tablet Take 20 mg by mouth at bedtime.     [Paused] spironolactone-hydrochlorothiazide (ALDACTAZIDE) 25-25 MG tablet Take 1 tablet by mouth daily.     traMADol  (ULTRAM ) 50 MG tablet Take 50 mg by mouth every 12 (twelve) hours as needed.     traZODone  (DESYREL ) 50 MG tablet Take 25 mg by mouth at bedtime as needed for sleep.     No current facility-administered medications on file prior to visit.   "

## 2024-01-30 ENCOUNTER — Ambulatory Visit: Admitting: Dermatology

## 2024-02-16 ENCOUNTER — Ambulatory Visit

## 2024-04-15 ENCOUNTER — Ambulatory Visit: Admitting: Dermatology

## 2024-04-18 ENCOUNTER — Ambulatory Visit (INDEPENDENT_AMBULATORY_CARE_PROVIDER_SITE_OTHER): Admitting: Nurse Practitioner
